# Patient Record
Sex: Male | Born: 1935
Health system: Southern US, Community
[De-identification: ages and names within clinical notes are randomized; demographics above are authoritative.]

## PROBLEM LIST (undated history)

## (undated) DIAGNOSIS — L309 Dermatitis, unspecified: Secondary | ICD-10-CM

## (undated) DIAGNOSIS — N400 Enlarged prostate without lower urinary tract symptoms: Secondary | ICD-10-CM

## (undated) DIAGNOSIS — K567 Ileus, unspecified: Secondary | ICD-10-CM

## (undated) DIAGNOSIS — I82409 Acute embolism and thrombosis of unspecified deep veins of unspecified lower extremity: Secondary | ICD-10-CM

## (undated) DIAGNOSIS — N320 Bladder-neck obstruction: Secondary | ICD-10-CM

## (undated) DIAGNOSIS — K56609 Unspecified intestinal obstruction, unspecified as to partial versus complete obstruction: Secondary | ICD-10-CM

## (undated) DIAGNOSIS — I480 Paroxysmal atrial fibrillation: Secondary | ICD-10-CM

## (undated) DIAGNOSIS — K5792 Diverticulitis of intestine, part unspecified, without perforation or abscess without bleeding: Secondary | ICD-10-CM

## (undated) DIAGNOSIS — M1990 Unspecified osteoarthritis, unspecified site: Secondary | ICD-10-CM

## (undated) DIAGNOSIS — M4800 Spinal stenosis, site unspecified: Secondary | ICD-10-CM

## (undated) HISTORY — PX: HIP FRACTURE SURGERY: SHX118

## (undated) HISTORY — PX: TONSILLECTOMY: SUR1361

## (undated) HISTORY — PX: CIRCUMCISION: SUR203

## (undated) HISTORY — PX: INGUINAL HERNIA REPAIR: SUR1180

## (undated) HISTORY — PX: CATARACT EXTRACTION W/ INTRAOCULAR LENS  IMPLANT, BILATERAL: SHX1307

## (undated) HISTORY — PX: FRACTURE SURGERY: SHX138

## (undated) HISTORY — PX: BACK SURGERY: SHX140

---

## 1998-12-01 ENCOUNTER — Inpatient Hospital Stay (HOSPITAL_COMMUNITY): Admission: EM | Admit: 1998-12-01 | Discharge: 1998-12-05 | Payer: Self-pay | Admitting: Emergency Medicine

## 1998-12-01 ENCOUNTER — Encounter: Payer: Self-pay | Admitting: Emergency Medicine

## 1998-12-03 ENCOUNTER — Encounter: Payer: Self-pay | Admitting: Orthopedic Surgery

## 2006-03-29 ENCOUNTER — Emergency Department (HOSPITAL_COMMUNITY): Admission: EM | Admit: 2006-03-29 | Discharge: 2006-03-29 | Payer: Self-pay | Admitting: Family Medicine

## 2006-04-04 ENCOUNTER — Emergency Department (HOSPITAL_COMMUNITY): Admission: EM | Admit: 2006-04-04 | Discharge: 2006-04-04 | Payer: Self-pay | Admitting: Family Medicine

## 2008-10-01 ENCOUNTER — Ambulatory Visit: Payer: Self-pay | Admitting: Gastroenterology

## 2008-10-01 DIAGNOSIS — K5732 Diverticulitis of large intestine without perforation or abscess without bleeding: Secondary | ICD-10-CM | POA: Insufficient documentation

## 2008-10-01 DIAGNOSIS — K5909 Other constipation: Secondary | ICD-10-CM | POA: Insufficient documentation

## 2008-10-21 ENCOUNTER — Ambulatory Visit: Payer: Self-pay | Admitting: Gastroenterology

## 2008-10-30 ENCOUNTER — Telehealth: Payer: Self-pay | Admitting: Gastroenterology

## 2013-01-28 ENCOUNTER — Other Ambulatory Visit: Payer: Self-pay | Admitting: Internal Medicine

## 2013-01-28 DIAGNOSIS — I8290 Acute embolism and thrombosis of unspecified vein: Secondary | ICD-10-CM

## 2013-01-30 ENCOUNTER — Ambulatory Visit
Admission: RE | Admit: 2013-01-30 | Discharge: 2013-01-30 | Disposition: A | Discharge status: Home / Self Care | Payer: Medicare Other | Source: Ambulatory Visit | Attending: Internal Medicine | Admitting: Internal Medicine

## 2013-01-30 DIAGNOSIS — I8290 Acute embolism and thrombosis of unspecified vein: Secondary | ICD-10-CM

## 2013-04-22 ENCOUNTER — Other Ambulatory Visit: Payer: Self-pay | Admitting: Internal Medicine

## 2013-04-22 ENCOUNTER — Ambulatory Visit
Admission: RE | Admit: 2013-04-22 | Discharge: 2013-04-22 | Disposition: A | Discharge status: Home / Self Care | Payer: Medicare Other | Source: Ambulatory Visit | Attending: Internal Medicine | Admitting: Internal Medicine

## 2013-04-22 DIAGNOSIS — R269 Unspecified abnormalities of gait and mobility: Secondary | ICD-10-CM

## 2015-07-21 ENCOUNTER — Ambulatory Visit: Payer: Self-pay | Admitting: Orthopedic Surgery

## 2015-07-21 NOTE — H&P (Signed)
Derek Bentley is an 79 y.o. male.   Chief Complaint: Right greater than left lower extremity radicular pain. The patient reports in history or subjective he is unable to walk any long distances. He has to use a wheelchair and a walker. HPI: The patient is a 79 year old male who presents with back pain. The patient is here today in referral from Dr. Ramos. The patient reports low back symptoms including low back pain which began year(s) ago without any known injury. The pain radiates to the left buttock and right buttock. The patient describes the severity of their symptoms as severe. Symptoms are exacerbated by standing. Current treatment includes non-opioid analgesics (OTC) and activity modification. Prior to being seen today the patient was previously evaluated by Dr. Ranos. Past evaluation has included x-ray of the lumbar spine and MRI of the lumbar spine. Past treatment has included non-opioid analgesics (Tramadol) and epidural injections (@ L5-S1 left).  REVIEW OF SYSTEMS Review of systems is negative for fevers, chest pain, shortness of breath, unexplained recent weight loss, loss of bowel or bladder function, burning with urination, joint swelling, rashes, weakness or numbness, difficulty with balance, easy bruising, excessive thirst or frequent urination.  I reviewed Dr. Ramos' notes. His epidural was performed. The patient has reported no lasting help from the epidural nor physical therapy.  No past medical history on file.  No past surgical history on file.  No family history on file. Social History:  has no tobacco, alcohol, and drug history on file.  Allergies: Allergies not on file   (Not in a hospital admission)  No results found for this or any previous visit (from the past 48 hour(s)). No results found.  Review of Systems  Constitutional: Negative.   HENT: Negative.   Eyes: Negative.   Respiratory: Negative.   Cardiovascular: Negative.   Gastrointestinal: Negative.    Genitourinary: Negative.   Musculoskeletal: Positive for back pain.  Skin: Negative.   Neurological: Positive for focal weakness.  Psychiatric/Behavioral: Negative.     There were no vitals taken for this visit. Physical Exam  Constitutional: He is oriented to person, place, and time. He appears well-developed and well-nourished.  HENT:  Head: Normocephalic.  Eyes: Pupils are equal, round, and reactive to light.  Neck: Normal range of motion.  Cardiovascular: Normal rate.   Respiratory: Effort normal.  GI: Soft.  Musculoskeletal:  On exam, he is in moderate distress. He is actually in a wheelchair today. Quad atrophy is noted bilaterally. He has a straight leg raise and produces buttock and thigh pain bilaterally. Right greater than left. Osteoarthrosis is noted in his knees bilaterally, but no significant effusion.  He has restricted motion of his hips, but it does not reproduce pain. Peripheral pulses are intact. No evidence of DVT.  Neurological: He is alert and oriented to person, place, and time.    AP lateral flexion and extension radiographs of lumbar spine demonstrates multilevel lumbar spondylosis. No instability on flexion and extension. Neural foraminal narrowing is noted. He has bilateral severe osteoarthrosis of the hips with retained hardware status post ORIF of his left hip.  MRI of lumbar spine obtained by Dr. Ramos demonstrates severe multifactorial spinal stenosis at 4-5 with a small extruded fragment at 4-5 to the right. He has facet arthropathy and neural foraminal stenosis. He has moderate stenosis at 3-4.   Assessment/Plan 1. 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  We had extensive discussion concerning for pathology, relevant anatomy and treatment options. He has failed conservative treatment and he is  rendered to a wheelchair. He is unable to walk. He is here with his wife. One option he may need to consider is decompression at 4-5 and possible 3-4 with resection of the hemilamina L4 in the neural arch to reduce the neurogenic symptoms.  I had an extensive discussion of the risks and benefits of the lumbar decompression with the patient including bleeding, infection, damage to neurovascular structures, epidural fibrosis, CSF leak requiring repair. We also discussed increase in pain, adjacent segment disease, recurrent disc herniation, need for future surgery including repeat decompression and/or fusion. We also discussed risks of postoperative hematoma, paralysis, anesthetic complications including DVT, PE, death, cardiopulmonary dysfunction. In addition, the perioperative and postoperative courses were discussed in detail including the rehabilitative time and return to functional activity and work. I provided the patient with an illustrated handout and utilized the appropriate surgical models. They would like to proceed with it.  In the interim, he is to continue using his walker and wheelchair and preoperative clearance. Other option is just to live with his symptoms, however, he is severely limited in terms of ambulatory capacity. He may require attention to his hips in the future as well as he does have significant osteoarthrosis of his hips as well.  Plan microlumbar decompression L4-5, possible L3-4, microdiscectomy L4-5  BISSELL, JACLYN M. PA-C for Dr. Beane 07/21/2015, 4:54 PM    

## 2015-07-21 NOTE — Progress Notes (Signed)
Please put orders in Epic surgery 07-29-15 pre op 07-23-15 Thanks

## 2015-07-22 NOTE — Patient Instructions (Signed)
Derek Bentley  07/22/2015   Your procedure is scheduled on:    07/29/2015    Report to [facility] Main  Entrance take [place]  elevators to 3rd floor to  Short Stay Center at     0830 AM.  Call this number if you have problems the morning of surgery [phone]   Remember: ONLY 1 PERSON MAY GO WITH YOU TO SHORT STAY TO GET  READY MORNING OF YOUR SURGERY.  Do not eat food or drink liquids :After Midnight.     Take these medicines the morning of surgery with A SIP OF WATER:  DO NOT TAKE ANY DIABETIC MEDICATIONS DAY OF YOUR SURGERY                               You may not have any metal on your body including hair pins and              piercings  Do not wear jewelry, , lotions, powders or perfumes, deodorant                       Men may shave face and neck.   Do not bring valuables to the hospital. [facility] IS NOT             RESPONSIBLE   FOR VALUABLES.  Contacts, dentures or bridgework may not be worn into surgery.  Leave suitcase in the car. After surgery it may be brought to your room.       Special Instructions: coughing and deep breathing exercises, leg exercises               Please read over the following fact sheets you were given: _____________________________________________________________________             [facility] - Preparing for Surgery Before surgery, you can play an important role.  Because skin is not sterile, your skin needs to be as free of germs as possible.  You can reduce the number of germs on your skin by washing with CHG (chlorahexidine gluconate) soap before surgery.  CHG is an antiseptic cleaner which kills germs and bonds with the skin to continue killing germs even after washing. Please DO NOT use if you have an allergy to CHG or antibacterial soaps.  If your skin becomes reddened/irritated stop using the CHG and inform your nurse when you arrive at Short Stay. Do not shave (including legs and underarms) for at least 48  hours prior to the first CHG shower.  You may shave your face/neck. Please follow these instructions carefully:  1.  Shower with CHG Soap the night before surgery and the  morning of Surgery.  2.  If you choose to wash your hair, wash your hair first as usual with your  normal  shampoo.  3.  After you shampoo, rinse your hair and body thoroughly to remove the  shampoo.                           4.  Use CHG as you would any other liquid soap.  You can apply chg directly  to the skin and wash                       Gently with  a scrungie or clean washcloth.  5.  Apply the CHG Soap to your body ONLY FROM THE NECK DOWN.   Do not use on face/ open                           Wound or open sores. Avoid contact with eyes, ears mouth and genitals (private parts).                       Wash face,  Genitals (private parts) with your normal soap.             6.  Wash thoroughly, paying special attention to the area where your surgery  will be performed.  7.  Thoroughly rinse your body with warm water from the neck down.  8.  DO NOT shower/wash with your normal soap after using and rinsing off  the CHG Soap.                9.  Pat yourself dry with a clean towel.            10.  Wear clean pajamas.            11.  Place clean sheets on your bed the night of your first shower and do not  sleep with pets. Day of Surgery : Do not apply any lotions/deodorants the morning of surgery.  Please wear clean clothes to the hospital/surgery center.  FAILURE TO FOLLOW THESE INSTRUCTIONS MAY RESULT IN THE CANCELLATION OF YOUR SURGERY PATIENT SIGNATURE_________________________________  NURSE SIGNATURE__________________________________  ________________________________________________________________________   Derek Bentley  An incentive spirometer is a tool that can help keep your lungs clear and active. This tool measures how well you are filling your lungs with each breath. Taking long deep breaths may help  reverse or decrease the chance of developing breathing (pulmonary) problems (especially infection) following:  A long period of time when you are unable to move or be active. BEFORE THE PROCEDURE   If the spirometer includes an indicator to show your best effort, your nurse or respiratory therapist will set it to a desired goal.  If possible, sit up straight or lean slightly forward. Try not to slouch.  Hold the incentive spirometer in an upright position. INSTRUCTIONS FOR USE  1. Sit on the edge of your bed if possible, or sit up as far as you can in bed or on a chair. 2. Hold the incentive spirometer in an upright position. 3. Breathe out normally. 4. Place the mouthpiece in your mouth and seal your lips tightly around it. 5. Breathe in slowly and as deeply as possible, raising the piston or the ball toward the top of the column. 6. Hold your breath for 3-5 seconds or for as long as possible. Allow the piston or ball to fall to the bottom of the column. 7. Remove the mouthpiece from your mouth and breathe out normally. 8. Rest for a few seconds and repeat Steps 1 through 7 at least 10 times every 1-2 hours when you are awake. Take your time and take a few normal breaths between deep breaths. 9. The spirometer may include an indicator to show your best effort. Use the indicator as a goal to work toward during each repetition. 10. After each set of 10 deep breaths, practice coughing to be sure your lungs are clear. If you have an incision (the cut made at the time of surgery), support your  incision when coughing by placing a pillow or rolled up towels firmly against it. Once you are able to get out of bed, walk around indoors and cough well. You may stop using the incentive spirometer when instructed by your caregiver.  RISKS AND COMPLICATIONS  Take your time so you do not get dizzy or light-headed.  If you are in pain, you may need to take or ask for pain medication before doing incentive  spirometry. It is harder to take a deep breath if you are having pain. AFTER USE  Rest and breathe slowly and easily.  It can be helpful to keep track of a log of your progress. Your caregiver can provide you with a simple table to help with this. If you are using the spirometer at home, follow these instructions: [place] IF:   You are having difficultly using the spirometer.  You have trouble using the spirometer as often as instructed.  Your pain medication is not giving enough relief while using the spirometer.  You develop fever of 100.5 F (38.1 C) or higher. SEEK IMMEDIATE MEDICAL CARE IF:   You cough up bloody sputum that had not been present before.  You develop fever of 102 F (38.9 C) or greater.  You develop worsening pain at or near the incision site. MAKE SURE YOU:   Understand these instructions.  Will watch your condition.  Will get help right away if you are not doing well or get worse. Document Released: 02/27/2007 Document Revised: 01/09/2012 Document Reviewed: 04/30/2007 [facility] Patient Information 2014 [place], [place].   ________________________________________________________________________

## 2015-07-23 ENCOUNTER — Ambulatory Visit (HOSPITAL_COMMUNITY)
Admission: RE | Admit: 2015-07-23 | Discharge: 2015-07-23 | Disposition: A | Discharge status: Home / Self Care | Payer: Medicare Other | Source: Ambulatory Visit | Attending: Orthopedic Surgery | Admitting: Orthopedic Surgery

## 2015-07-23 ENCOUNTER — Encounter (HOSPITAL_COMMUNITY)
Admission: RE | Admit: 2015-07-23 | Discharge: 2015-07-23 | Disposition: A | Discharge status: Home / Self Care | Payer: Medicare Other | Source: Ambulatory Visit | Attending: Specialist | Admitting: Specialist

## 2015-07-23 ENCOUNTER — Encounter (HOSPITAL_COMMUNITY): Payer: Self-pay

## 2015-07-23 ENCOUNTER — Ambulatory Visit: Payer: Self-pay | Admitting: Orthopedic Surgery

## 2015-07-23 DIAGNOSIS — M4854XA Collapsed vertebra, not elsewhere classified, thoracic region, initial encounter for fracture: Secondary | ICD-10-CM | POA: Diagnosis not present

## 2015-07-23 DIAGNOSIS — M48061 Spinal stenosis, lumbar region without neurogenic claudication: Secondary | ICD-10-CM

## 2015-07-23 DIAGNOSIS — M4806 Spinal stenosis, lumbar region: Secondary | ICD-10-CM | POA: Diagnosis not present

## 2015-07-23 DIAGNOSIS — Z01812 Encounter for preprocedural laboratory examination: Secondary | ICD-10-CM | POA: Insufficient documentation

## 2015-07-23 DIAGNOSIS — M4186 Other forms of scoliosis, lumbar region: Secondary | ICD-10-CM | POA: Diagnosis not present

## 2015-07-23 DIAGNOSIS — Z01818 Encounter for other preprocedural examination: Secondary | ICD-10-CM | POA: Insufficient documentation

## 2015-07-23 HISTORY — DX: Unspecified osteoarthritis, unspecified site: M19.90

## 2015-07-23 LAB — SURGICAL PCR SCREEN
MRSA, PCR: NEGATIVE
Staphylococcus aureus: POSITIVE — AB

## 2015-07-23 LAB — BASIC METABOLIC PANEL
ANION GAP: 6 (ref 5–15)
BUN: 10 mg/dL (ref 6–20)
CHLORIDE: 108 mmol/L (ref 101–111)
CO2: 27 mmol/L (ref 22–32)
Calcium: 9.2 mg/dL (ref 8.9–10.3)
Creatinine, Ser: 0.86 mg/dL (ref 0.61–1.24)
GFR calc Af Amer: >60 mL/min (ref >60)
GLUCOSE: 132 mg/dL — AB (ref 65–99)
POTASSIUM: 3.8 mmol/L (ref 3.5–5.1)
Sodium: 141 mmol/L (ref 135–145)

## 2015-07-23 LAB — CBC
HEMATOCRIT: 41.3 % (ref 39.0–52.0)
HEMOGLOBIN: 13.9 g/dL (ref 13.0–17.0)
MCH: 33.3 pg (ref 26.0–34.0)
MCHC: 33.7 g/dL (ref 30.0–36.0)
MCV: 98.8 fL (ref 78.0–100.0)
PLATELETS: 185 10*3/uL (ref 150–400)
RBC: 4.18 MIL/uL — ABNORMAL LOW (ref 4.22–5.81)
RDW: 13.1 % (ref 11.5–15.5)
WBC: 4.5 10*3/uL (ref 4.0–10.5)

## 2015-07-29 ENCOUNTER — Inpatient Hospital Stay (HOSPITAL_COMMUNITY): Payer: Medicare Other

## 2015-07-29 ENCOUNTER — Inpatient Hospital Stay (HOSPITAL_COMMUNITY): Payer: Medicare Other | Admitting: Certified Registered Nurse Anesthetist

## 2015-07-29 ENCOUNTER — Encounter (HOSPITAL_COMMUNITY): Payer: Self-pay | Admitting: *Deleted

## 2015-07-29 ENCOUNTER — Inpatient Hospital Stay (HOSPITAL_COMMUNITY)
Admission: AD | Admit: 2015-07-29 | Discharge: 2015-08-03 | DRG: 519 | Disposition: A | Discharge status: Home Health | Payer: Medicare Other | Source: Ambulatory Visit | Attending: Specialist | Admitting: Specialist

## 2015-07-29 ENCOUNTER — Ambulatory Visit (HOSPITAL_COMMUNITY): Payer: Medicare Other

## 2015-07-29 ENCOUNTER — Encounter (HOSPITAL_COMMUNITY)
Admission: AD | Disposition: A | Discharge status: Home Health | Payer: Self-pay | Source: Ambulatory Visit | Attending: Specialist

## 2015-07-29 DIAGNOSIS — R031 Nonspecific low blood-pressure reading: Secondary | ICD-10-CM | POA: Diagnosis not present

## 2015-07-29 DIAGNOSIS — N401 Enlarged prostate with lower urinary tract symptoms: Secondary | ICD-10-CM | POA: Diagnosis not present

## 2015-07-29 DIAGNOSIS — R9389 Abnormal findings on diagnostic imaging of other specified body structures: Secondary | ICD-10-CM | POA: Diagnosis present

## 2015-07-29 DIAGNOSIS — M160 Bilateral primary osteoarthritis of hip: Secondary | ICD-10-CM | POA: Diagnosis present

## 2015-07-29 DIAGNOSIS — M545 Low back pain: Secondary | ICD-10-CM | POA: Diagnosis not present

## 2015-07-29 DIAGNOSIS — R938 Abnormal findings on diagnostic imaging of other specified body structures: Secondary | ICD-10-CM | POA: Diagnosis not present

## 2015-07-29 DIAGNOSIS — M549 Dorsalgia, unspecified: Secondary | ICD-10-CM

## 2015-07-29 DIAGNOSIS — R918 Other nonspecific abnormal finding of lung field: Secondary | ICD-10-CM | POA: Diagnosis present

## 2015-07-29 DIAGNOSIS — M5126 Other intervertebral disc displacement, lumbar region: Principal | ICD-10-CM | POA: Diagnosis present

## 2015-07-29 DIAGNOSIS — R509 Fever, unspecified: Secondary | ICD-10-CM | POA: Insufficient documentation

## 2015-07-29 DIAGNOSIS — I4891 Unspecified atrial fibrillation: Secondary | ICD-10-CM | POA: Diagnosis not present

## 2015-07-29 DIAGNOSIS — I9789 Other postprocedural complications and disorders of the circulatory system, not elsewhere classified: Secondary | ICD-10-CM | POA: Diagnosis not present

## 2015-07-29 DIAGNOSIS — M48061 Spinal stenosis, lumbar region without neurogenic claudication: Secondary | ICD-10-CM | POA: Diagnosis present

## 2015-07-29 DIAGNOSIS — R338 Other retention of urine: Secondary | ICD-10-CM | POA: Diagnosis present

## 2015-07-29 DIAGNOSIS — Z8249 Family history of ischemic heart disease and other diseases of the circulatory system: Secondary | ICD-10-CM

## 2015-07-29 DIAGNOSIS — R112 Nausea with vomiting, unspecified: Secondary | ICD-10-CM

## 2015-07-29 DIAGNOSIS — M4806 Spinal stenosis, lumbar region: Secondary | ICD-10-CM | POA: Diagnosis present

## 2015-07-29 DIAGNOSIS — K567 Ileus, unspecified: Secondary | ICD-10-CM | POA: Insufficient documentation

## 2015-07-29 DIAGNOSIS — I959 Hypotension, unspecified: Secondary | ICD-10-CM | POA: Diagnosis present

## 2015-07-29 HISTORY — PX: LUMBAR LAMINECTOMY/DECOMPRESSION MICRODISCECTOMY: SHX5026

## 2015-07-29 LAB — HEMATOCRIT: HEMATOCRIT: 34.4 % — AB (ref 39.0–52.0)

## 2015-07-29 SURGERY — LUMBAR LAMINECTOMY/DECOMPRESSION MICRODISCECTOMY 2 LEVELS
Anesthesia: General | Site: Back

## 2015-07-29 MED ORDER — NEOSTIGMINE METHYLSULFATE 10 MG/10ML IV SOLN
INTRAVENOUS | Status: AC
  Filled 2015-07-29: qty 1

## 2015-07-29 MED ORDER — THROMBIN 5000 UNITS EX SOLR
CUTANEOUS | Status: DC | PRN
  Administered 2015-07-29: 10000 [IU] via TOPICAL

## 2015-07-29 MED ORDER — OXYCODONE-ACETAMINOPHEN 5-325 MG PO TABS
1.0000 | ORAL_TABLET | ORAL | Status: DC | PRN
  Administered 2015-07-29: 1 via ORAL
  Administered 2015-07-30: 2 via ORAL
  Filled 2015-07-29: qty 2
  Filled 2015-07-29: qty 1

## 2015-07-29 MED ORDER — PROPOFOL 10 MG/ML IV BOLUS
INTRAVENOUS | Status: AC
  Filled 2015-07-29: qty 20

## 2015-07-29 MED ORDER — ONDANSETRON HCL 4 MG/2ML IJ SOLN
INTRAMUSCULAR | Status: DC | PRN
  Administered 2015-07-29: 4 mg via INTRAVENOUS

## 2015-07-29 MED ORDER — CLINDAMYCIN PHOSPHATE 900 MG/50ML IV SOLN
900.0000 mg | INTRAVENOUS | Status: AC
  Administered 2015-07-29: 900 mg via INTRAVENOUS

## 2015-07-29 MED ORDER — PHENYLEPHRINE HCL 10 MG/ML IJ SOLN
INTRAMUSCULAR | Status: AC
  Filled 2015-07-29: qty 1

## 2015-07-29 MED ORDER — ROCURONIUM BROMIDE 100 MG/10ML IV SOLN
INTRAVENOUS | Status: AC
  Filled 2015-07-29: qty 1

## 2015-07-29 MED ORDER — SODIUM CHLORIDE 0.9 % IV BOLUS (SEPSIS)
500.0000 mL | Freq: Once | INTRAVENOUS | Status: AC
  Administered 2015-07-29: 500 mL via INTRAVENOUS

## 2015-07-29 MED ORDER — ACETAMINOPHEN 650 MG RE SUPP: 650.0000 mg | RECTAL | Status: DC | PRN

## 2015-07-29 MED ORDER — MAGNESIUM CITRATE PO SOLN
1.0000 | Freq: Once | ORAL | Status: AC | PRN
  Administered 2015-08-01: 1 via ORAL

## 2015-07-29 MED ORDER — MIDAZOLAM HCL 5 MG/5ML IJ SOLN
INTRAMUSCULAR | Status: DC | PRN
  Administered 2015-07-29: 1 mg via INTRAVENOUS

## 2015-07-29 MED ORDER — LIDOCAINE HCL (CARDIAC) 20 MG/ML IV SOLN
INTRAVENOUS | Status: DC | PRN
  Administered 2015-07-29: 50 mg via INTRAVENOUS

## 2015-07-29 MED ORDER — HYDROMORPHONE HCL 1 MG/ML IJ SOLN
INTRAMUSCULAR | Status: AC
  Filled 2015-07-29: qty 1

## 2015-07-29 MED ORDER — RISAQUAD PO CAPS
1.0000 | ORAL_CAPSULE | Freq: Every day | ORAL | Status: DC
  Administered 2015-07-30 – 2015-08-03 (×5): 1 via ORAL
  Filled 2015-07-29 (×6): qty 1

## 2015-07-29 MED ORDER — KCL IN DEXTROSE-NACL 20-5-0.45 MEQ/L-%-% IV SOLN
INTRAVENOUS | Status: DC
  Administered 2015-07-29: 50 mL/h via INTRAVENOUS
  Filled 2015-07-29 (×2): qty 1000

## 2015-07-29 MED ORDER — DOCUSATE SODIUM 100 MG PO CAPS: 100.0000 mg | ORAL_CAPSULE | Freq: Two times a day (BID) | ORAL | Status: DC | PRN

## 2015-07-29 MED ORDER — ROCURONIUM BROMIDE 100 MG/10ML IV SOLN
INTRAVENOUS | Status: DC | PRN
  Administered 2015-07-29: 10 mg via INTRAVENOUS
  Administered 2015-07-29: 20 mg via INTRAVENOUS

## 2015-07-29 MED ORDER — THROMBIN 5000 UNITS EX SOLR
CUTANEOUS | Status: AC
  Filled 2015-07-29: qty 10000

## 2015-07-29 MED ORDER — FOLIC ACID 1 MG PO TABS
1.0000 mg | ORAL_TABLET | Freq: Every day | ORAL | Status: AC
  Administered 2015-07-30 – 2015-08-01 (×3): 1 mg via ORAL
  Filled 2015-07-29 (×3): qty 1

## 2015-07-29 MED ORDER — HYDROMORPHONE HCL 1 MG/ML IJ SOLN
0.2500 mg | INTRAMUSCULAR | Status: DC | PRN
  Administered 2015-07-29 (×2): 0.5 mg via INTRAVENOUS

## 2015-07-29 MED ORDER — GLYCOPYRROLATE 0.2 MG/ML IJ SOLN
INTRAMUSCULAR | Status: DC | PRN
  Administered 2015-07-29: 0.6 mg via INTRAVENOUS

## 2015-07-29 MED ORDER — SUCCINYLCHOLINE CHLORIDE 20 MG/ML IJ SOLN
INTRAMUSCULAR | Status: DC | PRN
  Administered 2015-07-29: 100 mg via INTRAVENOUS

## 2015-07-29 MED ORDER — ONDANSETRON HCL 4 MG/2ML IJ SOLN: 4.0000 mg | INTRAMUSCULAR | Status: DC | PRN

## 2015-07-29 MED ORDER — SODIUM CHLORIDE 0.9 % IJ SOLN
INTRAMUSCULAR | Status: AC
  Filled 2015-07-29: qty 20

## 2015-07-29 MED ORDER — SODIUM CHLORIDE 0.9 % IR SOLN
Status: AC
  Filled 2015-07-29: qty 1

## 2015-07-29 MED ORDER — CLINDAMYCIN PHOSPHATE 900 MG/50ML IV SOLN
INTRAVENOUS | Status: AC
  Filled 2015-07-29: qty 50

## 2015-07-29 MED ORDER — FENTANYL CITRATE (PF) 250 MCG/5ML IJ SOLN
INTRAMUSCULAR | Status: AC
  Filled 2015-07-29: qty 25

## 2015-07-29 MED ORDER — PHENOL 1.4 % MT LIQD: 1.0000 | OROMUCOSAL | Status: DC | PRN

## 2015-07-29 MED ORDER — PROPOFOL 10 MG/ML IV BOLUS
INTRAVENOUS | Status: DC | PRN
  Administered 2015-07-29: 150 mg via INTRAVENOUS

## 2015-07-29 MED ORDER — ACETAMINOPHEN 325 MG PO TABS
650.0000 mg | ORAL_TABLET | ORAL | Status: DC | PRN
  Administered 2015-07-30 – 2015-08-02 (×3): 650 mg via ORAL
  Filled 2015-07-29 (×3): qty 2

## 2015-07-29 MED ORDER — CEFAZOLIN SODIUM-DEXTROSE 2-3 GM-% IV SOLR
2.0000 g | INTRAVENOUS | Status: AC
  Administered 2015-07-29: 2 g via INTRAVENOUS

## 2015-07-29 MED ORDER — PROMETHAZINE HCL 25 MG/ML IJ SOLN
6.2500 mg | INTRAMUSCULAR | Status: DC | PRN
Start: 2015-07-29 — End: 2015-07-29

## 2015-07-29 MED ORDER — ONDANSETRON HCL 4 MG/2ML IJ SOLN
INTRAMUSCULAR | Status: AC
  Filled 2015-07-29: qty 2

## 2015-07-29 MED ORDER — HYDROCODONE-ACETAMINOPHEN 5-325 MG PO TABS
1.0000 | ORAL_TABLET | ORAL | Status: DC | PRN
  Administered 2015-07-29 – 2015-07-30 (×4): 2 via ORAL
  Filled 2015-07-29 (×4): qty 2

## 2015-07-29 MED ORDER — BUPIVACAINE-EPINEPHRINE (PF) 0.5% -1:200000 IJ SOLN
INTRAMUSCULAR | Status: DC | PRN
  Administered 2015-07-29: 18 mL

## 2015-07-29 MED ORDER — PHENYLEPHRINE HCL 10 MG/ML IJ SOLN
INTRAMUSCULAR | Status: DC | PRN
  Administered 2015-07-29: 80 ug via INTRAVENOUS

## 2015-07-29 MED ORDER — NEOSTIGMINE METHYLSULFATE 10 MG/10ML IV SOLN
INTRAVENOUS | Status: DC | PRN
  Administered 2015-07-29: 4 mg via INTRAVENOUS

## 2015-07-29 MED ORDER — BUPIVACAINE-EPINEPHRINE (PF) 0.5% -1:200000 IJ SOLN
INTRAMUSCULAR | Status: AC
  Filled 2015-07-29: qty 30

## 2015-07-29 MED ORDER — MENTHOL 3 MG MT LOZG
1.0000 | LOZENGE | OROMUCOSAL | Status: DC | PRN
  Administered 2015-07-30: 3 mg via ORAL
  Filled 2015-07-29: qty 9

## 2015-07-29 MED ORDER — PHENYLEPHRINE 40 MCG/ML (10ML) SYRINGE FOR IV PUSH (FOR BLOOD PRESSURE SUPPORT)
PREFILLED_SYRINGE | INTRAVENOUS | Status: AC
Start: 2015-07-29 — End: 2015-07-29
  Filled 2015-07-29: qty 20

## 2015-07-29 MED ORDER — DEXTROSE 5 % IV SOLN
500.0000 mg | Freq: Four times a day (QID) | INTRAVENOUS | Status: DC | PRN
  Administered 2015-07-29 (×2): 500 mg via INTRAVENOUS
  Filled 2015-07-29 (×4): qty 5

## 2015-07-29 MED ORDER — MIDAZOLAM HCL 2 MG/2ML IJ SOLN
INTRAMUSCULAR | Status: AC
  Filled 2015-07-29: qty 4

## 2015-07-29 MED ORDER — HYDROCODONE-ACETAMINOPHEN 5-325 MG PO TABS: 1.0000 | ORAL_TABLET | ORAL | Status: DC | PRN

## 2015-07-29 MED ORDER — BISACODYL 5 MG PO TBEC
5.0000 mg | DELAYED_RELEASE_TABLET | Freq: Every day | ORAL | Status: DC | PRN
  Administered 2015-08-01: 5 mg via ORAL
  Filled 2015-07-29: qty 1

## 2015-07-29 MED ORDER — LACTATED RINGERS IV SOLN
INTRAVENOUS | Status: DC
  Administered 2015-07-29: 1000 mL via INTRAVENOUS
  Administered 2015-07-29: 11:00:00 via INTRAVENOUS

## 2015-07-29 MED ORDER — SENNOSIDES-DOCUSATE SODIUM 8.6-50 MG PO TABS: 1.0000 | ORAL_TABLET | Freq: Every evening | ORAL | Status: DC | PRN

## 2015-07-29 MED ORDER — VITAMIN C 500 MG PO TABS
500.0000 mg | ORAL_TABLET | Freq: Every day | ORAL | Status: DC
  Administered 2015-07-30 – 2015-08-03 (×5): 500 mg via ORAL
  Filled 2015-07-29 (×5): qty 1

## 2015-07-29 MED ORDER — FENTANYL CITRATE (PF) 100 MCG/2ML IJ SOLN
INTRAMUSCULAR | Status: DC | PRN
  Administered 2015-07-29: 50 ug via INTRAVENOUS

## 2015-07-29 MED ORDER — CEFAZOLIN SODIUM-DEXTROSE 2-3 GM-% IV SOLR
2.0000 g | Freq: Three times a day (TID) | INTRAVENOUS | Status: AC
  Administered 2015-07-29 – 2015-07-30 (×3): 2 g via INTRAVENOUS
  Filled 2015-07-29 (×3): qty 50

## 2015-07-29 MED ORDER — LIDOCAINE HCL (CARDIAC) 20 MG/ML IV SOLN
INTRAVENOUS | Status: AC
  Filled 2015-07-29: qty 5

## 2015-07-29 MED ORDER — GLYCOPYRROLATE 0.2 MG/ML IJ SOLN
INTRAMUSCULAR | Status: AC
  Filled 2015-07-29: qty 3

## 2015-07-29 MED ORDER — SODIUM CHLORIDE 0.9 % IR SOLN
Status: DC | PRN
  Administered 2015-07-29: 500 mL

## 2015-07-29 MED ORDER — CEFAZOLIN SODIUM-DEXTROSE 2-3 GM-% IV SOLR
INTRAVENOUS | Status: AC
  Filled 2015-07-29: qty 50

## 2015-07-29 MED ORDER — VITAMIN B-12 100 MCG PO TABS
ORAL_TABLET | Freq: Every day | ORAL | Status: DC
  Filled 2015-07-29 (×2): qty 1

## 2015-07-29 MED ORDER — HYDROMORPHONE HCL 1 MG/ML IJ SOLN
0.5000 mg | INTRAMUSCULAR | Status: DC | PRN
  Administered 2015-07-29: 0.5 mg via INTRAVENOUS
  Filled 2015-07-29: qty 1

## 2015-07-29 MED ORDER — METHOCARBAMOL 500 MG PO TABS
500.0000 mg | ORAL_TABLET | Freq: Four times a day (QID) | ORAL | Status: DC | PRN
  Filled 2015-07-29: qty 1

## 2015-07-29 MED ORDER — PHENYLEPHRINE HCL 10 MG/ML IJ SOLN
10.0000 mg | INTRAMUSCULAR | Status: DC | PRN
  Administered 2015-07-29: 25 ug/min via INTRAVENOUS

## 2015-07-29 MED ORDER — DOCUSATE SODIUM 100 MG PO CAPS
100.0000 mg | ORAL_CAPSULE | Freq: Two times a day (BID) | ORAL | Status: DC
  Administered 2015-07-29 – 2015-08-03 (×6): 100 mg via ORAL
  Filled 2015-07-29 (×4): qty 1

## 2015-07-29 MED ORDER — ALUM & MAG HYDROXIDE-SIMETH 200-200-20 MG/5ML PO SUSP: 30.0000 mL | Freq: Four times a day (QID) | ORAL | Status: DC | PRN

## 2015-07-29 SURGICAL SUPPLY — 48 items
BAG ZIPLOCK 12X15 (MISCELLANEOUS) IMPLANT
CLEANER TIP ELECTROSURG 2X2 (MISCELLANEOUS) ×2 IMPLANT
CLOTH 2% CHLOROHEXIDINE 3PK (PERSONAL CARE ITEMS) ×2 IMPLANT
DRAPE MICROSCOPE LEICA (MISCELLANEOUS) ×2 IMPLANT
DRAPE SHEET LG 3/4 BI-LAMINATE (DRAPES) ×2 IMPLANT
DRAPE SURG 17X11 SM STRL (DRAPES) ×2 IMPLANT
DRAPE UTILITY XL STRL (DRAPES) ×2 IMPLANT
DRSG AQUACEL AG ADV 3.5X 4 (GAUZE/BANDAGES/DRESSINGS) IMPLANT
DRSG AQUACEL AG ADV 3.5X 6 (GAUZE/BANDAGES/DRESSINGS) ×2 IMPLANT
DRSG TELFA 3X8 NADH (GAUZE/BANDAGES/DRESSINGS) ×2 IMPLANT
DURAPREP 26ML APPLICATOR (WOUND CARE) ×2 IMPLANT
DURASEAL SPINE SEALANT 3ML (MISCELLANEOUS) IMPLANT
ELECT BLADE TIP CTD 4 INCH (ELECTRODE) ×2 IMPLANT
ELECT REM PT RETURN 9FT ADLT (ELECTROSURGICAL) ×2
ELECTRODE REM PT RTRN 9FT ADLT (ELECTROSURGICAL) ×1 IMPLANT
GLOVE BIOGEL PI IND STRL 7.0 (GLOVE) ×1 IMPLANT
GLOVE BIOGEL PI INDICATOR 7.0 (GLOVE) ×1
GLOVE SURG SS PI 7.0 STRL IVOR (GLOVE) ×2 IMPLANT
GLOVE SURG SS PI 7.5 STRL IVOR (GLOVE) IMPLANT
GLOVE SURG SS PI 8.0 STRL IVOR (GLOVE) ×4 IMPLANT
GOWN STRL REUS W/TWL XL LVL3 (GOWN DISPOSABLE) ×4 IMPLANT
IV CATH 14GX2 1/4 (CATHETERS) ×2 IMPLANT
KIT BASIN OR (CUSTOM PROCEDURE TRAY) ×2 IMPLANT
KIT POSITIONING SURG ANDREWS (MISCELLANEOUS) ×2 IMPLANT
MANIFOLD NEPTUNE II (INSTRUMENTS) ×2 IMPLANT
NEEDLE SPNL 18GX3.5 QUINCKE PK (NEEDLE) ×4 IMPLANT
PACK LAMINECTOMY ORTHO (CUSTOM PROCEDURE TRAY) ×2 IMPLANT
PATTIES SURGICAL .5 X.5 (GAUZE/BANDAGES/DRESSINGS) ×2 IMPLANT
PATTIES SURGICAL .75X.75 (GAUZE/BANDAGES/DRESSINGS) ×2 IMPLANT
PATTIES SURGICAL 1X1 (DISPOSABLE) ×2 IMPLANT
PEN SKIN MARKING BROAD (MISCELLANEOUS) ×2 IMPLANT
RUBBERBAND STERILE (MISCELLANEOUS) ×2 IMPLANT
SPONGE LAP 4X18 X RAY DECT (DISPOSABLE) ×2 IMPLANT
SPONGE SURGIFOAM ABS GEL 100 (HEMOSTASIS) ×2 IMPLANT
STAPLER VISISTAT (STAPLE) IMPLANT
STRIP CLOSURE SKIN 1/2X4 (GAUZE/BANDAGES/DRESSINGS) IMPLANT
SUT NURALON 4 0 TR CR/8 (SUTURE) IMPLANT
SUT PROLENE 3 0 PS 2 (SUTURE) IMPLANT
SUT VIC AB 1 CT1 27 (SUTURE) ×1
SUT VIC AB 1 CT1 27XBRD ANTBC (SUTURE) ×1 IMPLANT
SUT VIC AB 1-0 CT2 27 (SUTURE) ×2 IMPLANT
SUT VIC AB 2-0 CT1 27 (SUTURE)
SUT VIC AB 2-0 CT1 TAPERPNT 27 (SUTURE) IMPLANT
SUT VIC AB 2-0 CT2 27 (SUTURE) ×2 IMPLANT
SYR 3ML LL SCALE MARK (SYRINGE) ×2 IMPLANT
TOWEL OR 17X26 10 PK STRL BLUE (TOWEL DISPOSABLE) ×2 IMPLANT
TOWEL OR NON WOVEN STRL DISP B (DISPOSABLE) ×2 IMPLANT
YANKAUER SUCT BULB TIP NO VENT (SUCTIONS) ×2 IMPLANT

## 2015-07-29 NOTE — Interval H&P Note (Signed)
History and Physical Interval Note:  07/29/2015 7:25 AM  Derek Bentley  has presented today for surgery, with the diagnosis of SPINAL STENOSIS   The various methods of treatment have been discussed with the patient and family. After consideration of risks, benefits and other options for treatment, the patient has consented to  Procedure(s): DECOMPRESSION L4-L5 POSSIBLE L3-L4,  MICRODISCECTOMY L4-L5     (2 LEVELS) (N/A) as a surgical intervention .  The patient's history has been reviewed, patient examined, no change in status, stable for surgery.  I have reviewed the patient's chart and labs.  Questions were answered to the patient's satisfaction.     BEANE,JEFFREY C

## 2015-07-29 NOTE — Consult Note (Signed)
PCP:  Thayer Headings, MD    requesting consult provider [facility]   orthopedics    Reason for consult: Hypotension transient   Assessment/recomendations  79 yo with no significant prior medical hx had a transient hypotension event postoperatively improved with gentle IVF bolus.  Currently hemodynamically stable.    Present on Admission:  . Transient hypotension -currently resolved, patient   at baseline has low normal BP. Given mild interstitial edema on CXR would avoid over aggressive fluid resuscitation as long as patient is hemodynamically stable and asymptomatic. Transient hypotension could have been due to fluid shifts post operatively and poor PO intake as well as narcotic administration would hold of on IV narcotics if can tolerate PO. At this point no evidence of sepsis. Or hemorrhagic shock. Patient is back to baseline. Will continue to monitor closely with frequent vital signs.  . Abnormal chest x-ray - mild interstitial edema would avoid fluid overload. Will check BNP and CBC. Infection less likely given patient is afebrile and asymptomatic. If clinical picture changes repeat imaging would be warranted.   HPI: Derek Bentley is a 79 y.o. male   has a past medical history of Arthritis.   Presented with  Patient with history of spinal stenosis height undergone today decompression at L4-L5 by Dr. Jillyn Hidden. Patient apparently was stable in the PACU. Patient was transferred to floor. He was noted his blood pressure has gradually declined from 132/70 at 1 PM down to 89/45 at :50. Patient was a symptomatic. Per review of records patient blood pressure usually runs in the low 100s over 50s. Primary care team ordered 500 IV bolus which was given repeat blood pressure was 107/60. Patient to remain a symptomatic no evidence of fever no associated tachycardia in fact patient have had some mild bradycardia down to 56. His IV pain medications have been held. Patient has been endorsing no  lower extremity pain which has chronic and ongoing. Lactic acid, hematomatocrit and basic metabolic panel was ordered. Given workup for possible sepsis chest x-ray was done showed diffuse increased pulmonary interstitium bilaterally nonspecific was worrisome form interstitial edema versus pneumonitis. I personally reviewed the film as well as discussed it with Radiology although there is some evidence of pulmonary interstitial edema it is exaggerated by film technique.  Patient denies  Any shortness of breath. Does not appears to be fluid overloaded on CXR.     Review of Systems:    Pertinent positives include: Leg pain bilateral  Constitutional:  No weight loss, night sweats, Fevers, chills, fatigue, weight loss  HEENT:  No headaches, Difficulty swallowing,Tooth/dental problems,Sore throat,  No sneezing, itching, ear ache, nasal congestion, post nasal drip,  Cardio-vascular:  No chest pain, Orthopnea, PND, anasarca, dizziness, palpitations.no Bilateral lower extremity swelling  GI:  No heartburn, indigestion, abdominal pain, nausea, vomiting, diarrhea, change in bowel habits, loss of appetite, melena, blood in stool, hematemesis Resp:  no shortness of breath at rest. No dyspnea on exertion, No excess mucus, no productive cough, No non-productive cough, No coughing up of blood.No change in color of mucus.No wheezing. Skin:  no rash or lesions. No jaundice GU:  no dysuria, change in color of urine, no urgency or frequency. No straining to urinate.  No flank pain.  Musculoskeletal:  No joint pain or no joint swelling. No decreased range of motion. No back pain.  Psych:  No change in mood or affect. No depression or anxiety. No memory loss.  Neuro: no localizing neurological complaints, no tingling, no weakness, no  double vision, no gait abnormality, no slurred speech, no confusion  Otherwise ROS are negative except for above, 10 systems were reviewed  Past Medical History: Past  Medical History  Diagnosis Date  . Arthritis    Past Surgical History  Procedure Laterality Date  . Hernia repair      left and right   . Left hip fracture surgery     . Tonsillectomy    . Circumcision    . Lumbar laminectomy/decompression microdiscectomy N/A 07/29/2015    Procedure: DECOMPRESSION L4-L5 and L3-L4,  MICRODISCECTOMY L4-L5     (2 LEVELS);  Surgeon: Jene Every, MD;  Location: [facility];  Service: Orthopedics;  Laterality: N/A;     Medications: Prior to Admission medications   Medication Sig Start Date End Date Taking? Authorizing Provider  acetaminophen (TYLENOL) 500 MG tablet Take 500 mg by mouth every 6 (six) hours as needed for mild pain or headache.   Yes Historical Provider, MD  ascorbic acid (VITAMIN C) 500 MG tablet Take 500 mg by mouth daily.   Yes Historical Provider, MD  calcium-vitamin D (OSCAL WITH D) 500-200 MG-UNIT per tablet Take 1 tablet by mouth daily with breakfast.   Yes Historical Provider, MD  Cyanocobalamin (VITAMIN B 12 PO) Take 1 tablet by mouth daily.   Yes Historical Provider, MD  FOLIC ACID PO Take 1 tablet by mouth daily.   Yes Historical Provider, MD  MAGNESIUM PO Take 1 tablet by mouth daily.   Yes Historical Provider, MD  Multiple Vitamin (MULTIVITAMIN WITH MINERALS) TABS tablet Take 1 tablet by mouth daily.   Yes Historical Provider, MD  TURMERIC PO Take 1 tablet by mouth daily.   Yes Historical Provider, MD  docusate sodium (COLACE) 100 MG capsule Take 1 capsule (100 mg total) by mouth 2 (two) times daily as needed for mild constipation. 07/29/15   Jene Every, MD  HYDROcodone-acetaminophen (NORCO/VICODIN) 5-325 MG tablet Take 1 tablet by mouth every 4 (four) hours as needed. 07/29/15   Jene Every, MD    Allergies:  No Known Allergies  Social History:  Ambulatory  Lives at home  With family     reports that he has never smoked. He does not have any smokeless tobacco history on file. He reports that he does not drink alcohol or  use illicit drugs.    Family History: family history includes Hypertension in his other.    Physical Exam: Patient Vitals for the past 24 hrs:  BP Temp Temp src Pulse Resp SpO2 Height Weight  07/29/15 2308 107/60 mmHg - - - - - - -  07/29/15 2149 (!) 89/45 mmHg 98.2 F (36.8 C) Oral 64 16 100 % - -  07/29/15 1615 (!) 100/51 mmHg 98 F (36.7 C) - 70 18 100 % - -  07/29/15 1515 (!) 105/56 mmHg 97.9 F (36.6 C) - (!) 58 18 100 % - -  07/29/15 1415 (!) 111/41 mmHg 98 F (36.7 C) - 60 18 100 % - -  07/29/15 1314 130/63 mmHg 97.9 F (36.6 C) Oral 68 16 100 %  (1.727 m) 58.514 kg (129 lb)  07/29/15 1300 132/70 mmHg 97.7 F (36.5 C) - (!) 56 16 94 % - -  07/29/15 1245 123/66 mmHg - - 66 14 100 % - -  07/29/15 1240 - - - 68 15 100 % - -  07/29/15 1230 126/61 mmHg - - 71 15 100 % - -  07/29/15 1225 - - - 74 17 100 % - -  07/29/15 1215 122/73 mmHg - - 76 15 100 % - -  07/29/15 1208 130/78 mmHg 97.4 F (36.3 C) - 77 (!) 9 100 % - -  07/29/15 0913 - - - - - - - 58.832 kg (129 lb 11.2 oz)  07/29/15 0846 - - - - - -  (1.727 m) -  07/29/15 0813 (!) 117/46 mmHg 97.7 F (36.5 C) Oral 83 18 100 % - -    1. General:  in No Acute distress 2. Psychological: Alert and  Oriented 3. Head/ENT:     Dry Mucous Membranes                          Head Non traumatic, neck supple                          Normal  Dentition 4. SKIN: normal  Skin turgor,  Skin clean Dry and intact no rash 5. Heart: Regular rate and rhythm no Murmur, Rub or gallop 6. Lungs: Clear to auscultation bilaterally, no wheezes or crackles   7. Abdomen: Soft, non-tender, Non distended 8. Lower extremities: no clubbing, cyanosis, or edema 9. Neurologically Grossly intact, moving all 4 extremities equally 10. MSK: Normal range of motion  body mass index is 19.62 kg/(m^2).   Labs on Admission:   No results found for this or any previous visit (from the past 24 hour(s)).  UA not obtained  No results found for:  HGBA1C  Estimated Creatinine Clearance: 60.5 mL/min (by C-G formula based on Cr of 0.86).  BNP (last 3 results) No results for input(s): PROBNP in the last 8760 hours.  Other results:  I have pearsonaly reviewed this: ECG REPORT Not obtained  [facility] Weights   07/29/15 0913 07/29/15 1314  Weight: 58.832 kg (129 lb 11.2 oz) 58.514 kg (129 lb)     Cultures: No results found for: SDES, SPECREQUEST, CULT, REPTSTATUS   Radiological Exams on Admission: Dg Chest Port 1 View  07/29/2015   CLINICAL DATA:  Abnormally low blood pressure  EXAM: PORTABLE CHEST 1 VIEW  COMPARISON:  July 15, 2015  FINDINGS: The heart size and mediastinal contours are stable. There is bilateral increased pulmonary interstitium. There is no focal pneumonia or pleural effusion. The visualized skeletal structures are stable.  IMPRESSION: Diffuse increased pulmonary interstitium bilaterally. This is nonspecific but can be seen in interstitial edema or viral pneumonitis.   Electronically Signed   By: Sherian Rein M.D.   On: 07/29/2015 23:07   Dg Spine Portable 1 View  07/29/2015   CLINICAL DATA:  Patient for L3-5 lumbar surgery. Intraoperative localization film.  EXAM: PORTABLE SPINE - 1 VIEW  COMPARISON:  Intraoperative localization films earlier today.  FINDINGS: Clamp remains on the L4 spinous process. 3 probes are in place. The most superior projects over the L3-4 foramina. Second more inferior probe projects over the L4-5 foramina. A third probe is at the level of the superior endplate of L5.  IMPRESSION: Localization as above.   Electronically Signed   By: Drusilla Kanner M.D.   On: 07/29/2015 11:40   Dg Spine Portable 1 View  07/29/2015   CLINICAL DATA:  Patient for lumbar surgery L3-5. Intraoperative localization film.  EXAM: PORTABLE SPINE - 1 VIEW  COMPARISON:  Intraoperative localization film earlier today.  FINDINGS: Clamp is now in place along the L4 spinous process. Probe superior to the clamp is directed  toward  the L3-4 interspace and probe inferior to the spinous process is directed toward the L4-5 interspace.  IMPRESSION: Localization as above.  Clamp is on the L4 spinous process.   Electronically Signed   By: Drusilla Kanner M.D.   On: 07/29/2015 10:53   Dg Spine Portable 1 View  07/29/2015   CLINICAL DATA:  Surgical level L3-4, L4-5.  EXAM: PORTABLE SPINE - 1 VIEW  COMPARISON:  07/23/2015  FINDINGS: Posterior surgical instruments are directed at the L3 and L5 spinous processes  IMPRESSION: Intraoperative localization as above.   Electronically Signed   By: Charlett Nose M.D.   On: 07/29/2015 10:36    Chart has been reviewed  Family  at  Bedside  plan of care was discussed with   Wife      Other plan as per orders.  I have spent a total of 65 min on this admission extra time was taken to discuss Xray with radiology  DOUTOVA,ANASTASSIA 07/29/2015, 11:54 PM  [facility]  Pager [phone]   after 2 AM please page floor coverage PA If 7AM-7PM, please contact the day team taking care of the patient  [email or web address]  Password [email or web address]

## 2015-07-29 NOTE — H&P (View-Only) (Signed)
Derek Bentley is an 79 y.o. male.   Chief Complaint: Right greater than left lower extremity radicular pain. The patient reports in history or subjective he is unable to walk any long distances. He has to use a wheelchair and a walker. HPI: The patient is a 79 year old male who presents with back pain. The patient is here today in referral from Dr. Ethelene Hal. The patient reports low back symptoms including low back pain which began year(s) ago without any known injury. The pain radiates to the left buttock and right buttock. The patient describes the severity of their symptoms as severe. Symptoms are exacerbated by standing. Current treatment includes non-opioid analgesics (OTC) and activity modification. Prior to being seen today the patient was previously evaluated by Dr. Gwenith Spitz. Past evaluation has included x-ray of the lumbar spine and MRI of the lumbar spine. Past treatment has included non-opioid analgesics (Tramadol) and epidural injections (@ L5-S1 left).  REVIEW OF SYSTEMS Review of systems is negative for fevers, chest pain, shortness of breath, unexplained recent weight loss, loss of bowel or bladder function, burning with urination, joint swelling, rashes, weakness or numbness, difficulty with balance, easy bruising, excessive thirst or frequent urination.  I reviewed Dr. Ethelene Hal' notes. His epidural was performed. The patient has reported no lasting help from the epidural nor physical therapy.  No past medical history on file.  No past surgical history on file.  No family history on file. Social History:  has no tobacco, alcohol, and drug history on file.  Allergies: Allergies not on file   (Not in a hospital admission)  No results found for this or any previous visit (from the past 48 hour(s)). No results found.  Review of Systems  Constitutional: Negative.   HENT: Negative.   Eyes: Negative.   Respiratory: Negative.   Cardiovascular: Negative.   Gastrointestinal: Negative.    Genitourinary: Negative.   Musculoskeletal: Positive for back pain.  Skin: Negative.   Neurological: Positive for focal weakness.  Psychiatric/Behavioral: Negative.     There were no vitals taken for this visit. Physical Exam  Constitutional: He is oriented to person, place, and time. He appears well-developed and well-nourished.  HENT:  Head: Normocephalic.  Eyes: Pupils are equal, round, and reactive to light.  Neck: Normal range of motion.  Cardiovascular: Normal rate.   Respiratory: Effort normal.  GI: Soft.  Musculoskeletal:  On exam, he is in moderate distress. He is actually in a wheelchair today. Quad atrophy is noted bilaterally. He has a straight leg raise and produces buttock and thigh pain bilaterally. Right greater than left. Osteoarthrosis is noted in his knees bilaterally, but no significant effusion.  He has restricted motion of his hips, but it does not reproduce pain. Peripheral pulses are intact. No evidence of DVT.  Neurological: He is alert and oriented to person, place, and time.    AP lateral flexion and extension radiographs of lumbar spine demonstrates multilevel lumbar spondylosis. No instability on flexion and extension. Neural foraminal narrowing is noted. He has bilateral severe osteoarthrosis of the hips with retained hardware status post ORIF of his left hip.  MRI of lumbar spine obtained by Dr. Ethelene Hal demonstrates severe multifactorial spinal stenosis at 4-5 with a small extruded fragment at 4-5 to the right. He has facet arthropathy and neural foraminal stenosis. He has moderate stenosis at 3-4.   Assessment/Plan 1. Neurogenic claudication secondary to severe spinal stenosis at 4-5, moderate at 3-4, rendering the patient to a wheelchair, refractory to rest,  activity modification, epidural and physical therapy.  We had extensive discussion concerning for pathology, relevant anatomy and treatment options. He has failed conservative treatment and he is  rendered to a wheelchair. He is unable to walk. He is here with his wife. One option he may need to consider is decompression at 4-5 and possible 3-4 with resection of the hemilamina L4 in the neural arch to reduce the neurogenic symptoms.  I had an extensive discussion of the risks and benefits of the lumbar decompression with the patient including bleeding, infection, damage to neurovascular structures, epidural fibrosis, CSF leak requiring repair. We also discussed increase in pain, adjacent segment disease, recurrent disc herniation, need for future surgery including repeat decompression and/or fusion. We also discussed risks of postoperative hematoma, paralysis, anesthetic complications including DVT, PE, death, cardiopulmonary dysfunction. In addition, the perioperative and postoperative courses were discussed in detail including the rehabilitative time and return to functional activity and work. I provided the patient with an illustrated handout and utilized the appropriate surgical models. They would like to proceed with it.  In the interim, he is to continue using his walker and wheelchair and preoperative clearance. Other option is just to live with his symptoms, however, he is severely limited in terms of ambulatory capacity. He may require attention to his hips in the future as well as he does have significant osteoarthrosis of his hips as well.  Plan microlumbar decompression L4-5, possible L3-4, microdiscectomy L4-5  BISSELL, JACLYN M. PA-C for Dr. Shelle Iron 07/21/2015, 4:54 PM

## 2015-07-29 NOTE — Transfer of Care (Signed)
Immediate Anesthesia Transfer of Care Note  Patient: Derek Bentley  Procedure(s) Performed: Procedure(s): DECOMPRESSION L4-L5 and L3-L4,  MICRODISCECTOMY L4-L5     (2 LEVELS) (N/A)  Patient Location: PACU  Anesthesia Type:General  Level of Consciousness: awake, alert  and oriented  Airway & Oxygen Therapy: Patient Spontanous Breathing and Patient connected to face mask oxygen  Post-op Assessment: Report given to RN and Post -op Vital signs reviewed and stable  Post vital signs: Reviewed and stable  Last Vitals:  Filed Vitals:   07/29/15 0813  BP: 117/46  Pulse: 83  Temp: 36.5 C  Resp: 18    Complications: No apparent anesthesia complications

## 2015-07-29 NOTE — Anesthesia Postprocedure Evaluation (Signed)
  Anesthesia Post-op Note  Patient: Derek Bentley  Procedure(s) Performed: Procedure(s): DECOMPRESSION L4-L5 and L3-L4,  MICRODISCECTOMY L4-L5     (2 LEVELS) (N/A)  Patient Location: PACU  Anesthesia Type:General  Level of Consciousness: awake, alert  and sedated  Airway and Oxygen Therapy: Patient Spontanous Breathing  Post-op Pain: mild  Post-op Assessment: Post-op Vital signs reviewed   LLE Sensation: No numbness, No tingling   RLE Sensation: No numbness, No tingling      Post-op Vital Signs: stable  Last Vitals:  Filed Vitals:   07/29/15 1314  BP: 130/63  Pulse: 68  Temp: 36.6 C  Resp: 16    Complications: No apparent anesthesia complications

## 2015-07-29 NOTE — Anesthesia Procedure Notes (Signed)
Procedure Name: Intubation Date/Time: 07/29/2015 10:03 AM Performed by: Carolyne Fiscal, AMY F Pre-anesthesia Checklist: Patient identified, Emergency Drugs available, Suction available, Patient being monitored and Timeout performed Patient Re-evaluated:Patient Re-evaluated prior to inductionOxygen Delivery Method: Circle system utilized Preoxygenation: Pre-oxygenation with 100% oxygen Intubation Type: IV induction Ventilation: Mask ventilation without difficulty Laryngoscope Size: Miller and 2 Grade View: Grade I Tube type: Oral Tube size: 7.5 mm Number of attempts: 1 Airway Equipment and Method: Stylet Placement Confirmation: ETT inserted through vocal cords under direct vision,  positive ETCO2 and breath sounds checked- equal and bilateral Secured at: 24 cm Tube secured with: Tape Dental Injury: Teeth and Oropharynx as per pre-operative assessment

## 2015-07-29 NOTE — Anesthesia Preprocedure Evaluation (Addendum)
Anesthesia Evaluation  Patient identified by MRN, date of birth, ID band Patient awake    Reviewed: Allergy & Precautions, NPO status , Patient's Chart, lab work & pertinent test results  History of Anesthesia Complications Negative for: history of anesthetic complications  Airway Mallampati: I  TM Distance: >3 FB Neck ROM: Full    Dental  (+) Edentulous Upper, Edentulous Lower   Pulmonary neg pulmonary ROS,    breath sounds clear to auscultation       Cardiovascular negative cardio ROS   Rhythm:Regular Rate:Normal     Neuro/Psych negative neurological ROS     GI/Hepatic negative GI ROS, Neg liver ROS,   Endo/Other  negative endocrine ROS  Renal/GU negative Renal ROS     Musculoskeletal  (+) Arthritis ,   Abdominal (+) - obese, scaphoid   Peds  Hematology negative hematology ROS (+)   Anesthesia Other Findings   Reproductive/Obstetrics                            Anesthesia Physical Anesthesia Plan  ASA: II  Anesthesia Plan: General   Post-op Pain Management:    Induction: Intravenous  Airway Management Planned: Oral ETT  Additional Equipment:   Intra-op Plan:   Post-operative Plan: Extubation in OR  Informed Consent: I have reviewed the patients History and Physical, chart, labs and discussed the procedure including the risks, benefits and alternatives for the proposed anesthesia with the patient or authorized representative who has indicated his/her understanding and acceptance.   Dental advisory given  Plan Discussed with: CRNA and Surgeon  Anesthesia Plan Comments:         Anesthesia Quick Evaluation

## 2015-07-29 NOTE — Brief Op Note (Signed)
07/29/2015  11:45 AM  PATIENT:  Derek Bentley  79 y.o. male  PRE-OPERATIVE DIAGNOSIS:  SPINAL STENOSIS L4-L5 and L3-L4  POST-OPERATIVE DIAGNOSIS:  SPINAL STENOSIS L4-L5 and L3-L4  PROCEDURE:  Procedure(s): DECOMPRESSION L4-L5 and L3-L4,  MICRODISCECTOMY L4-L5     (2 LEVELS) (N/A)  SURGEON:  Surgeon(s) and Role:    * Jene Every, MD - Primary  PHYSICIAN ASSISTANT:   ASSISTANTS: Bissell   ANESTHESIA:   general  EBL:  Total I/O In: 1000 [I.V.:1000] Out: 25 [Blood:25]  BLOOD ADMINISTERED:none  DRAINS: none   LOCAL MEDICATIONS USED:  MARCAINE     SPECIMEN:  No Specimen  DISPOSITION OF SPECIMEN:  N/A  COUNTS:  YES  TOURNIQUET:  * No tourniquets in log *  DICTATION: .Other Dictation: Dictation Number [id number]  PLAN OF CARE: Admit for overnight observation  PATIENT DISPOSITION:  PACU - hemodynamically stable.   Delay start of Pharmacological VTE agent (>24hrs) due to surgical blood loss or risk of bleeding: yes

## 2015-07-30 DIAGNOSIS — K567 Ileus, unspecified: Secondary | ICD-10-CM | POA: Diagnosis not present

## 2015-07-30 DIAGNOSIS — R031 Nonspecific low blood-pressure reading: Secondary | ICD-10-CM | POA: Diagnosis not present

## 2015-07-30 DIAGNOSIS — I959 Hypotension, unspecified: Secondary | ICD-10-CM | POA: Diagnosis present

## 2015-07-30 DIAGNOSIS — M4806 Spinal stenosis, lumbar region: Secondary | ICD-10-CM | POA: Diagnosis not present

## 2015-07-30 DIAGNOSIS — R9389 Abnormal findings on diagnostic imaging of other specified body structures: Secondary | ICD-10-CM | POA: Diagnosis present

## 2015-07-30 DIAGNOSIS — G43A1 Cyclical vomiting, intractable: Secondary | ICD-10-CM | POA: Diagnosis not present

## 2015-07-30 DIAGNOSIS — R1110 Vomiting, unspecified: Secondary | ICD-10-CM | POA: Diagnosis not present

## 2015-07-30 DIAGNOSIS — R509 Fever, unspecified: Secondary | ICD-10-CM | POA: Diagnosis not present

## 2015-07-30 DIAGNOSIS — N400 Enlarged prostate without lower urinary tract symptoms: Secondary | ICD-10-CM | POA: Diagnosis not present

## 2015-07-30 LAB — CBC
HCT: 34.8 % — ABNORMAL LOW (ref 39.0–52.0)
HEMATOCRIT: 34.2 % — AB (ref 39.0–52.0)
HEMOGLOBIN: 11.8 g/dL — AB (ref 13.0–17.0)
HEMOGLOBIN: 11.4 g/dL — AB (ref 13.0–17.0)
MCH: 33.9 pg (ref 26.0–34.0)
MCH: 32.9 pg (ref 26.0–34.0)
MCHC: 33.9 g/dL (ref 30.0–36.0)
MCHC: 33.3 g/dL (ref 30.0–36.0)
MCV: 100 fL (ref 78.0–100.0)
MCV: 98.6 fL (ref 78.0–100.0)
PLATELETS: 144 10*3/uL — AB (ref 150–400)
Platelets: 155 10*3/uL (ref 150–400)
RBC: 3.48 MIL/uL — AB (ref 4.22–5.81)
RBC: 3.47 MIL/uL — AB (ref 4.22–5.81)
RDW: 13.3 % (ref 11.5–15.5)
RDW: 13.1 % (ref 11.5–15.5)
WBC: 8.6 10*3/uL (ref 4.0–10.5)
WBC: 9.6 10*3/uL (ref 4.0–10.5)

## 2015-07-30 LAB — BASIC METABOLIC PANEL
ANION GAP: 5 (ref 5–15)
ANION GAP: 5 (ref 5–15)
BUN: 8 mg/dL (ref 6–20)
BUN: 8 mg/dL (ref 6–20)
CALCIUM: 8.1 mg/dL — AB (ref 8.9–10.3)
CO2: 28 mmol/L (ref 22–32)
CO2: 27 mmol/L (ref 22–32)
Calcium: 8.3 mg/dL — ABNORMAL LOW (ref 8.9–10.3)
Chloride: 106 mmol/L (ref 101–111)
Chloride: 105 mmol/L (ref 101–111)
Creatinine, Ser: 0.84 mg/dL (ref 0.61–1.24)
Creatinine, Ser: 0.87 mg/dL (ref 0.61–1.24)
GFR calc Af Amer: >60 mL/min (ref >60)
GLUCOSE: 128 mg/dL — AB (ref 65–99)
Glucose, Bld: 108 mg/dL — ABNORMAL HIGH (ref 65–99)
POTASSIUM: 4.1 mmol/L (ref 3.5–5.1)
Potassium: 4.1 mmol/L (ref 3.5–5.1)
Sodium: 139 mmol/L (ref 135–145)
Sodium: 137 mmol/L (ref 135–145)

## 2015-07-30 LAB — BRAIN NATRIURETIC PEPTIDE: B NATRIURETIC PEPTIDE 5: 299.2 pg/mL — AB (ref 0.0–100.0)

## 2015-07-30 LAB — LACTIC ACID, PLASMA: Lactic Acid, Venous: 1 mmol/L (ref 0.5–2.0)

## 2015-07-30 MED ORDER — ONDANSETRON HCL 4 MG/2ML IJ SOLN
4.0000 mg | INTRAMUSCULAR | Status: DC | PRN
Start: 2015-07-30 — End: 2015-07-31
  Administered 2015-07-30: 4 mg via INTRAVENOUS
  Filled 2015-07-30: qty 2

## 2015-07-30 MED ORDER — VITAMIN B-12 100 MCG PO TABS
100.0000 ug | ORAL_TABLET | Freq: Every day | ORAL | Status: DC
  Administered 2015-07-30 – 2015-08-03 (×5): 100 ug via ORAL
  Filled 2015-07-30 (×5): qty 1

## 2015-07-30 MED ORDER — TAMSULOSIN HCL 0.4 MG PO CAPS
0.4000 mg | ORAL_CAPSULE | Freq: Once | ORAL | Status: AC
  Administered 2015-07-30: 0.4 mg via ORAL
  Filled 2015-07-30: qty 1

## 2015-07-30 MED ORDER — ONDANSETRON HCL 4 MG PO TABS: 4.0000 mg | ORAL_TABLET | ORAL | Status: DC | PRN

## 2015-07-30 MED ORDER — METOCLOPRAMIDE HCL 5 MG/ML IJ SOLN
10.0000 mg | Freq: Four times a day (QID) | INTRAMUSCULAR | Status: DC | PRN
  Administered 2015-07-30 – 2015-07-31 (×2): 10 mg via INTRAVENOUS
  Filled 2015-07-30 (×2): qty 2

## 2015-07-30 NOTE — Discharge Summary (Signed)
Physician Discharge Summary   Patient ID: Derek Bentley MRN: [id number] DOB/AGE: Aug 29, 1936 79 y.o.  Admit date: 07/29/2015 Discharge date: 07/30/2015  Primary Diagnosis:   SPINAL STENOSIS L4-L5 and L3-L4  Admission Diagnoses:  Past Medical History  Diagnosis Date  . Arthritis    Discharge Diagnoses:   Principal Problem:   Spinal stenosis of lumbar region Active Problems:   Transient hypotension   Abnormal chest x-ray  Procedure:  Procedure(s) (LRB): DECOMPRESSION L4-L5 and L3-L4,  MICRODISCECTOMY L4-L5     (2 LEVELS) (N/A)   Consults: hospitalists- for hypotension  HPI:  see H&P    Laboratory Data: Hospital Outpatient Visit on 07/23/2015  Component Date Value Ref Range Status  . Sodium 07/23/2015 141  135 - 145 mmol/L Final  . Potassium 07/23/2015 3.8  3.5 - 5.1 mmol/L Final  . Chloride 07/23/2015 108  101 - 111 mmol/L Final  . CO2 07/23/2015 27  22 - 32 mmol/L Final  . Glucose, Bld 07/23/2015 132* 65 - 99 mg/dL Final  . BUN 07/23/2015 10  6 - 20 mg/dL Final  . Creatinine, Ser 07/23/2015 0.86  0.61 - 1.24 mg/dL Final  . Calcium 07/23/2015 9.2  8.9 - 10.3 mg/dL Final  . GFR calc non Af Amer 07/23/2015 >60  >60 mL/min Final  . GFR calc Af Amer 07/23/2015 >60  >60 mL/min Final   Comment: (NOTE) The eGFR has been calculated using the CKD EPI equation. This calculation has not been validated in all clinical situations. eGFR's persistently <60 mL/min signify possible Chronic Kidney Disease.   . Anion gap 07/23/2015 6  5 - 15 Final  . WBC 07/23/2015 4.5  4.0 - 10.5 K/uL Final  . RBC 07/23/2015 4.18* 4.22 - 5.81 MIL/uL Final  . Hemoglobin 07/23/2015 13.9  13.0 - 17.0 g/dL Final  . HCT 07/23/2015 41.3  39.0 - 52.0 % Final  . MCV 07/23/2015 98.8  78.0 - 100.0 fL Final  . MCH 07/23/2015 33.3  26.0 - 34.0 pg Final  . MCHC 07/23/2015 33.7  30.0 - 36.0 g/dL Final  . RDW 07/23/2015 13.1  11.5 - 15.5 % Final  . Platelets 07/23/2015 185  150 - 400 K/uL Final  . MRSA,  PCR 07/23/2015 NEGATIVE  NEGATIVE Final  . Staphylococcus aureus 07/23/2015 POSITIVE* NEGATIVE Final   Comment:        The Xpert SA Assay (FDA approved for NASAL specimens in patients over 24 years of age), is one component of a comprehensive surveillance program.  Test performance has been validated by [facility] for patients greater than or equal to 8 year old. It is not intended to diagnose infection nor to guide or monitor treatment.     Recent Labs  07/29/15 2300 07/30/15 0530  HGB 11.8* 11.4*    Recent Labs  07/29/15 2300 07/30/15 0530  WBC 8.6 9.6  RBC 3.48* 3.47*  HCT 34.8*  34.4* 34.2*  PLT 155 144*    Recent Labs  07/29/15 2300 07/30/15 0530  NA 139 137  K 4.1 4.1  CL 106 105  CO2 28 27  BUN 8 8  CREATININE 0.84 0.87  GLUCOSE 108* 128*  CALCIUM 8.1* 8.3*   No results for input(s): LABPT, INR in the last 72 hours.  X-Rays:Dg Lumbar Spine 2-3 Views  07/23/2015   CLINICAL DATA:  Lumbar spine surgery.  EXAM: LUMBAR SPINE - 2-3 VIEW  COMPARISON:  06/03/2015.  FINDINGS: Paraspinal soft tissues normal. Diffuse severe degenerative changes noted of the lumbar  spine. Stable T12 compression fracture. Stable mild scoliosis lumbar spine concave left.  IMPRESSION: Diffuse degenerative changes scoliosis lumbar spine. Stable mild T12 compression fracture. No new changes. No acute abnormality .   Electronically Signed   By: Marcello Moores  Register   On: 07/23/2015 13:59   Dg Chest Port 1 View  07/29/2015   CLINICAL DATA:  Abnormally low blood pressure  EXAM: PORTABLE CHEST 1 VIEW  COMPARISON:  July 15, 2015  FINDINGS: The heart size and mediastinal contours are stable. There is bilateral increased pulmonary interstitium. There is no focal pneumonia or pleural effusion. The visualized skeletal structures are stable.  IMPRESSION: Diffuse increased pulmonary interstitium bilaterally. This is nonspecific but can be seen in interstitial edema or viral pneumonitis.    Electronically Signed   By: Abelardo Diesel M.D.   On: 07/29/2015 23:07   Dg Spine Portable 1 View  07/29/2015   CLINICAL DATA:  Patient for L3-5 lumbar surgery. Intraoperative localization film.  EXAM: PORTABLE SPINE - 1 VIEW  COMPARISON:  Intraoperative localization films earlier today.  FINDINGS: Clamp remains on the L4 spinous process. 3 probes are in place. The most superior projects over the L3-4 foramina. Second more inferior probe projects over the L4-5 foramina. A third probe is at the level of the superior endplate of L5.  IMPRESSION: Localization as above.   Electronically Signed   By: Inge Rise M.D.   On: 07/29/2015 11:40   Dg Spine Portable 1 View  07/29/2015   CLINICAL DATA:  Patient for lumbar surgery L3-5. Intraoperative localization film.  EXAM: PORTABLE SPINE - 1 VIEW  COMPARISON:  Intraoperative localization film earlier today.  FINDINGS: Clamp is now in place along the L4 spinous process. Probe superior to the clamp is directed toward the L3-4 interspace and probe inferior to the spinous process is directed toward the L4-5 interspace.  IMPRESSION: Localization as above.  Clamp is on the L4 spinous process.   Electronically Signed   By: Inge Rise M.D.   On: 07/29/2015 10:53   Dg Spine Portable 1 View  07/29/2015   CLINICAL DATA:  Surgical level L3-4, L4-5.  EXAM: PORTABLE SPINE - 1 VIEW  COMPARISON:  07/23/2015  FINDINGS: Posterior surgical instruments are directed at the L3 and L5 spinous processes  IMPRESSION: Intraoperative localization as above.   Electronically Signed   By: Rolm Baptise M.D.   On: 07/29/2015 10:36    EKG: Orders placed or performed during the hospital encounter of 07/29/15  . ED EKG  . ED EKG     Hospital Course: Patient was admitted to [facility] and taken to the OR and underwent the above state procedure without complications.  Patient tolerated the procedure well and was later transferred to the recovery room and then to the  orthopaedic floor for postoperative care.  They were given PO and IV analgesics for pain control following their surgery.  They were given 24 hours of postoperative antibiotics.   PT was consulted postop to assist with mobility and transfers.  The patient was allowed to be WBAT with therapy and was taught back precautions. Discharge planning was consulted to help with postop disposition and equipment needs.  Patient had a fair night on the evening of surgery and started to get up OOB with therapy on day one. Patient was seen in rounds and was ready to go home on day one.  They were given discharge instructions and dressing directions.  They were instructed on when to follow  up in the office with Dr. Tonita Cong. He was seen by the hospitalists for hypotension prior to D/C which had resolved by that point and was deemed stable for D/C home.   Diet: Regular diet Activity:WBAT Follow-up:in 10-14 days Disposition - Home Discharged Condition: good   Discharge Instructions    Call MD / Call 911    Complete by:  As directed   If you experience chest pain or shortness of breath, CALL 911 and be transported to the hospital emergency room.  If you develope a fever above 101 F, pus (white drainage) or increased drainage or redness at the wound, or calf pain, call your surgeon's office.     Constipation Prevention    Complete by:  As directed   Drink plenty of fluids.  Prune juice may be helpful.  You may use a stool softener, such as Colace (over the counter) 100 mg twice a day.  Use MiraLax (over the counter) for constipation as needed.     Diet - low sodium heart healthy    Complete by:  As directed      Increase activity slowly as tolerated    Complete by:  As directed             Medication List    STOP taking these medications        TURMERIC PO      TAKE these medications        acetaminophen 500 MG tablet  Commonly known as:  TYLENOL  Take 500 mg by mouth every 6 (six) hours as needed for  mild pain or headache.     ascorbic acid 500 MG tablet  Commonly known as:  VITAMIN C  Take 500 mg by mouth daily.     calcium-vitamin D 500-200 MG-UNIT tablet  Commonly known as:  OSCAL WITH D  Take 1 tablet by mouth daily with breakfast.     docusate sodium 100 MG capsule  Commonly known as:  COLACE  Take 1 capsule (100 mg total) by mouth 2 (two) times daily as needed for mild constipation.     FOLIC ACID PO  Take 1 tablet by mouth daily.     HYDROcodone-acetaminophen 5-325 MG tablet  Commonly known as:  NORCO/VICODIN  Take 1 tablet by mouth every 4 (four) hours as needed.     MAGNESIUM PO  Take 1 tablet by mouth daily.     multivitamin with minerals Tabs tablet  Take 1 tablet by mouth daily.     VITAMIN B 12 PO  Take 1 tablet by mouth daily.           Follow-up Information    Follow up with BEANE,JEFFREY C, MD In 2 weeks.   Specialty:  Orthopedic Surgery   Why:  For suture removal   Contact information:   [address] [place] [address] [phone]       Signed: Lacie Draft, PA-C Orthopaedic Surgery 07/30/2015, 11:02 AM

## 2015-07-30 NOTE — Progress Notes (Signed)
Patient refusing to have foley removed. Patient states" they have an overactive bladder and will be using the bathroom too often." Patient reminded of the risks. Patient also told the benefits of early foley removal. This information was relayed to the PA, Avel Peace.

## 2015-07-30 NOTE — Evaluation (Signed)
Physical Therapy Evaluation Patient Details Name: Derek Bentley MRN: [id number] DOB: 1936/07/02 Today's Date: 07/30/2015   History of Present Illness  79 yo male s/p DECOMPRESSION L4-L5 and L3-L4, MICRODISCECTOMY L4-L5due to spinal stenosis  Clinical Impression  Patient is s/p above surgery resulting in the deficits listed below (see PT Problem List).  Patient will benefit from skilled PT to increase their independence and safety with mobility (while adhering to their precautions) to allow discharge to the venue listed below.  Pt educated on log roll technique and back precautions.  Pt will need to practice steps prior to d/c.     Follow Up Recommendations Home health PT;Supervision for mobility/OOB    Equipment Recommendations  None recommended by PT    Recommendations for Other Services       Precautions / Restrictions Precautions Precautions: Fall;Back Precaution Comments: Administerred back precautions handout Restrictions Weight Bearing Restrictions: No      Mobility  Bed Mobility Overal bed mobility: Needs Assistance Bed Mobility: Supine to Sit     Supine to sit: Min guard     General bed mobility comments: verbal cues for log roll technique  Transfers Overall transfer level: Needs assistance Equipment used: Rolling walker (2 wheeled) Transfers: Sit to/from Stand Sit to Stand: Min assist         General transfer comment: verbal cues for precautions and hand placement, assist to rise  Ambulation/Gait Ambulation/Gait assistance: Min guard Ambulation Distance (Feet): 160 Feet Assistive device: Rolling walker (2 wheeled) Gait Pattern/deviations: Step-through pattern;Trunk flexed;Decreased stride length Gait velocity: decreased   General Gait Details: verbal cues for back precautions, increased thoracic and cervical flexion however pt reports hx of this (likely due to spinal stenosis), encouraged upright posture as pt able to tolerate  Stairs             Wheelchair Mobility    Modified Rankin (Stroke Patients Only)       Balance Overall balance assessment: Needs assistance Sitting-balance support: No upper extremity supported;Feet supported Sitting balance-Leahy Scale: Fair     Standing balance support: Bilateral upper extremity supported;During functional activity Standing balance-Leahy Scale: Fair                               Pertinent Vitals/Pain Pain Assessment: Faces Pain Score: 5  Faces Pain Scale: Hurts even more Pain Location: back with mobility Pain Descriptors / Indicators: Sore Pain Intervention(s): Monitored during session;Repositioned    Home Living Family/patient expects to be discharged to:: Private residence Living Arrangements: Spouse/significant other Available Help at Discharge: Family;Available 24 hours/day Type of Home: House Home Access: Stairs to enter Entrance Stairs-Rails: Right Entrance Stairs-Number of Steps: 4 Home Layout: Able to live on main level with bedroom/bathroom Home Equipment: Walker - 2 wheels;Wheelchair - manual;Hospital bed;Shower seat      Prior Function Level of Independence: Independent with assistive device(s)         Comments: has been using RW since last Nov     Hand Dominance   Dominant Hand: Right    Extremity/Trunk Assessment   Upper Extremity Assessment: Generalized weakness           Lower Extremity Assessment: Generalized weakness      Cervical / Trunk Assessment: Kyphotic  Communication   Communication: No difficulties  Cognition Arousal/Alertness: Awake/alert Behavior During Therapy: WFL for tasks assessed/performed Overall Cognitive Status: Within Functional Limits for tasks assessed  General Comments      Exercises        Assessment/Plan    PT Assessment Patient needs continued PT services  PT Diagnosis Difficulty walking;Acute pain   PT Problem List Decreased strength;Decreased  activity tolerance;Decreased mobility;Decreased knowledge of use of DME;Decreased knowledge of precautions;Pain  PT Treatment Interventions Gait training;DME instruction;Functional mobility training;Patient/family education;Therapeutic activities;Therapeutic exercise;Stair training   PT Goals (Current goals can be found in the Care Plan section) Acute Rehab PT Goals Patient Stated Goal: go home today PT Goal Formulation: With patient Time For Goal Achievement: 08/01/15 Potential to Achieve Goals: Good    Frequency Min 5X/week   Barriers to discharge        Co-evaluation               End of Session Equipment Utilized During Treatment: Gait belt Activity Tolerance: Patient tolerated treatment well Patient left: in chair;with call bell/phone within reach;with family/visitor present Nurse Communication: Mobility status    Functional Assessment Tool Used: clinical judgement Functional Limitation: Mobility: Walking and moving around Mobility: Walking and Moving Around Current Status [phone]): At least 20 percent but less than 40 percent impaired, limited or restricted Mobility: Walking and Moving Around Goal Status [phone]): At least 1 percent but less than 20 percent impaired, limited or restricted    Time: 0851-0915 PT Time Calculation (min) (ACUTE ONLY): 24 min   Charges:   PT Evaluation $Initial PT Evaluation Tier I: 1 Procedure PT Treatments $Gait Training: 8-22 mins   PT G Codes:   PT G-Codes **NOT FOR INPATIENT CLASS** Functional Assessment Tool Used: clinical judgement Functional Limitation: Mobility: Walking and moving around Mobility: Walking and Moving Around Current Status (B1478): At least 20 percent but less than 40 percent impaired, limited or restricted Mobility: Walking and Moving Around Goal Status [phone]): At least 1 percent but less than 20 percent impaired, limited or restricted    LEMYRE,KATHrine E 07/30/2015, 12:27 PM Zenovia Jarred, PT,  DPT 07/30/2015 Pager: [phone]

## 2015-07-30 NOTE — Care Management Note (Signed)
Case Management Note  Patient Details  Name: ASPEN DETERDING MRN: [id number] Date of Birth: [birth year]  Subjective/Objective:                    MICRODISCECTOMY L4-L5due to spinal stenosis Action/Plan:  Discharge planning Expected Discharge Date:  07/30/15               Expected Discharge Plan:  [place]  In-House Referral:     Discharge planning Services  CM Consult  Post Acute Care Choice:  Home Health Choice offered to:  Patient  DME Arranged:  3-N-1 DME Agency:  [place]:  PT, OT [facility] Agency:  [place]  Status of Service:  Completed, signed off  Medicare Important Message Given:    Date Medicare IM Given:    Medicare IM give by:    Date Additional Medicare IM Given:    Additional Medicare Important Message give by:     If discussed at [place] of Stay Meetings, dates discussed:    Additional Comments: CM met with pt in room to offer choice of home health agency.  Pt chooses [facility] to render HHPT/OT.  Orders and face to face have been requested.  Referral made to [facility] rep, Kristen.  CM called [facility] DME rep, Lecretia to please deliver the 3n1 to room.  No other CM needs were communicated.  Dellie Catholic, RN 07/30/2015, 3:22 PM

## 2015-07-30 NOTE — Progress Notes (Signed)
[facility] PROGRESS NOTE    Progress Note   GERAL COKER ZOX:[id number] DOB: 1936-01-20 DOA: 2015/08/04 PCP: Thayer Headings, MD   Brief Narrative:   Derek Bentley is an 79 y.o. male past medical history of spinal stenosis and admitted on 2015-08-04 for surgical intervention for this current problem. We're consulted for transient hypotension  Assessment/Plan:   Principal Problem:   Spinal stenosis of lumbar region Active Problems:   Transient hypotension   Abnormal chest x-ray Her blood pressures remain greater than 100s, she has remained asymptomatic have to agree with Dr. Felipa Furnace evaluation that her transient hypotension could've been due to fluid shifts and IV narcotic administration. Seems like her pain is controlled on oral narcotics. She has remained afebrile, with no leukocytosis and her hemoglobin is stable. KVO her IV fluids. BMP is 300 which is undetermined she has no JVD no lower extremity edema. From my standpoint he should be able to be discharged, will sign off.    DVT Prophylaxis - Lovenox ordered.  Family Communication: none Disposition Plan: Per orthopedic surgery. Code Status:     Code Status Orders        Start     Ordered   2015/08/04 1325  Full code   Continuous     04-Aug-2015 1324    Advance Directive Documentation        Most Recent Value   Type of Advance Directive  Healthcare Power of Attorney, Living will   Pre-existing out of facility DNR order (yellow form or pink MOST form)     "MOST" Form in Place?          IV Access:    Peripheral IV   Procedures and diagnostic studies:   Dg Chest Port 1 View  Aug 04, 2015   CLINICAL DATA:  Abnormally low blood pressure  EXAM: PORTABLE CHEST 1 VIEW  COMPARISON:  July 15, 2015  FINDINGS: The heart size and mediastinal contours are stable. There is bilateral increased pulmonary interstitium. There is no focal pneumonia or pleural effusion. The visualized skeletal structures are stable.   IMPRESSION: Diffuse increased pulmonary interstitium bilaterally. This is nonspecific but can be seen in interstitial edema or viral pneumonitis.   Electronically Signed   By: Sherian Rein M.D.   On: 08/04/2015 23:07   Dg Spine Portable 1 View  2015-08-04   CLINICAL DATA:  Patient for L3-5 lumbar surgery. Intraoperative localization film.  EXAM: PORTABLE SPINE - 1 VIEW  COMPARISON:  Intraoperative localization films earlier today.  FINDINGS: Clamp remains on the L4 spinous process. 3 probes are in place. The most superior projects over the L3-4 foramina. Second more inferior probe projects over the L4-5 foramina. A third probe is at the level of the superior endplate of L5.  IMPRESSION: Localization as above.   Electronically Signed   By: Drusilla Kanner M.D.   On: Aug 04, 2015 11:40   Dg Spine Portable 1 View  2015/08/04   CLINICAL DATA:  Patient for lumbar surgery L3-5. Intraoperative localization film.  EXAM: PORTABLE SPINE - 1 VIEW  COMPARISON:  Intraoperative localization film earlier today.  FINDINGS: Clamp is now in place along the L4 spinous process. Probe superior to the clamp is directed toward the L3-4 interspace and probe inferior to the spinous process is directed toward the L4-5 interspace.  IMPRESSION: Localization as above.  Clamp is on the L4 spinous process.   Electronically Signed   By: Drusilla Kanner M.D.   On: 08/04/2015 10:53   Dg Spine Portable  1 View  07/29/2015   CLINICAL DATA:  Surgical level L3-4, L4-5.  EXAM: PORTABLE SPINE - 1 VIEW  COMPARISON:  07/23/2015  FINDINGS: Posterior surgical instruments are directed at the L3 and L5 spinous processes  IMPRESSION: Intraoperative localization as above.   Electronically Signed   By: Charlett Nose M.D.   On: 07/29/2015 10:36     Medical Consultants:      Anti-Infectives:   Anti-infectives    Start     Dose/Rate Route Frequency Ordered Stop   07/29/15 1600  ceFAZolin (ANCEF) IVPB 2 g/50 mL premix     2 g 100 mL/hr over 30  Minutes Intravenous Every 8 hours 07/29/15 1324 07/30/15 0914   07/29/15 1024  polymyxin B 500,000 Units, bacitracin 50,000 Units in sodium chloride irrigation 0.9 % 500 mL irrigation  Status:  Discontinued       As needed 07/29/15 1024 07/29/15 1203   07/29/15 0830  clindamycin (CLEOCIN) IVPB 900 mg     900 mg 100 mL/hr over 30 Minutes Intravenous On call to O.R. 07/29/15 0818 07/29/15 0952   07/29/15 0830  ceFAZolin (ANCEF) IVPB 2 g/50 mL premix     2 g 100 mL/hr over 30 Minutes Intravenous On call to O.R. 07/29/15 0818 07/29/15 0950      Subjective:    Clarice Pole has no new complaints feels great. He relates he's never had a problem with blood pressure in the past. He does not take any antihypertensive medication.  Objective:    Filed Vitals:   07/30/15 0124 07/30/15 0338 07/30/15 0503 07/30/15 0722  BP: 100/54 107/57 101/58 101/59  Pulse: 69 76 72 65  Temp: 98.8 F (37.1 C) 98 F (36.7 C) 97.9 F (36.6 C) 98.3 F (36.8 C)  TempSrc: Oral Oral Oral Oral  Resp: Height:      Weight:      SpO2: 100% 99% 99% 100%    Intake/Output Summary (Last 24 hours) at 07/30/15 1031 Last data filed at 07/30/15 0830  Gross per 24 hour  Intake 2958.33 ml  Output    795 ml  Net 2163.33 ml   Filed Weights   07/29/15 0913 07/29/15 1314  Weight: 58.832 kg (129 lb 11.2 oz) 58.514 kg (129 lb)    Exam: Gen:  NAD Cardiovascular:  RRR, No M/R/G Respiratory:  Lungs CTAB Gastrointestinal:  Abdomen soft, NT/ND, + BS Extremities:  No C/E/C   Data Reviewed:    Labs: Basic Metabolic Panel:  Recent Labs Lab 07/23/15 1100 07/29/15 2300 07/30/15 0530  NA 141 139 137  K 3.8 4.1 4.1  CL 108 106 105  CO2 GLUCOSE 132* 108* 128*  BUN CREATININE 0.86 0.84 0.87  CALCIUM 9.2 8.1* 8.3*   GFR Estimated Creatinine Clearance: 59.8 mL/min (by C-G formula based on Cr of 0.87). Liver Function Tests: No results for input(s): AST, ALT, ALKPHOS, BILITOT,  PROT, ALBUMIN in the last 168 hours. No results for input(s): LIPASE, AMYLASE in the last 168 hours. No results for input(s): AMMONIA in the last 168 hours. Coagulation profile No results for input(s): INR, PROTIME in the last 168 hours.  CBC:  Recent Labs Lab 07/23/15 1100 07/29/15 2300 07/30/15 0530  WBC 4.5 8.6 9.6  HGB 13.9 11.8* 11.4*  HCT 41.3 34.8*  34.4* 34.2*  MCV 98.8 100.0 98.6  PLT 185 155 144*   Cardiac Enzymes: No results for input(s): CKTOTAL, CKMB, CKMBINDEX,  TROPONINI in the last 168 hours. BNP (last 3 results) No results for input(s): PROBNP in the last 8760 hours. CBG: No results for input(s): GLUCAP in the last 168 hours. D-Dimer: No results for input(s): DDIMER in the last 72 hours. Hgb A1c: No results for input(s): HGBA1C in the last 72 hours. Lipid Profile: No results for input(s): CHOL, HDL, LDLCALC, TRIG, CHOLHDL, LDLDIRECT in the last 72 hours. Thyroid function studies: No results for input(s): TSH, T4TOTAL, T3FREE, THYROIDAB in the last 72 hours.  Invalid input(s): FREET3 Anemia work up: No results for input(s): VITAMINB12, FOLATE, FERRITIN, TIBC, IRON, RETICCTPCT in the last 72 hours. Sepsis Labs:  Recent Labs Lab 07/23/15 1100 07/29/15 2300 07/30/15 0530  WBC 4.5 8.6 9.6  LATICACIDVEN  --  1.0  --    Microbiology Recent Results (from the past 240 hour(s))  Surgical pcr screen     Status: Abnormal   Collection Time: 07/23/15 10:24 AM  Result Value Ref Range Status   MRSA, PCR NEGATIVE NEGATIVE Final   Staphylococcus aureus POSITIVE (A) NEGATIVE Final    Comment:        The Xpert SA Assay (FDA approved for NASAL specimens in patients over 65 years of age), is one component of a comprehensive surveillance program.  Test performance has been validated by [facility] for patients greater than or equal to 18 year old. It is not intended to diagnose infection nor to guide or monitor treatment.      Medications:   .  acidophilus  1 capsule Oral Daily  . docusate sodium  100 mg Oral BID  . folic acid  1 mg Oral Daily  . vitamin B-12   Oral Daily  . ascorbic acid  500 mg Oral Daily   Continuous Infusions: . dextrose 5 % and 0.45 % NaCl with KCl 20 mEq/L 50 mL/hr (07/29/15 1538)    Time spent: 15 min   LOS: 1 day   Marinda Elk  [facility] Pager [phone]  *Please refer to [email or web address], password [email or web address] to get updated schedule on who will round on this patient, as hospitalists switch teams weekly. If 7PM-7AM, please contact night-coverage at [email or web address], password [email or web address] for any overnight needs.  07/30/2015, 10:31 AM

## 2015-07-30 NOTE — Op Note (Signed)
NAMEULICES, MAACK                ACCOUNT NO.:  [id number]  MEDICAL RECORD NO.:  [id number]  LOCATION:  1621                         FACILITY:  [facility]  PHYSICIAN:  Jene Every, M.D.    DATE OF BIRTH:  January 18, 1936  DATE OF PROCEDURE:  07/29/2015 DATE OF DISCHARGE:                              OPERATIVE REPORT   PREOPERATIVE DIAGNOSES:  Spinal stenosis, L3-L4, L4-L5.  Herniated nucleus pulposus at L4-L5.  POSTOPERATIVE DIAGNOSES:  Spinal stenosis, L3-L4, L4-L5.  Herniated nucleus pulposus at L4-L5.  PROCEDURES PERFORMED: 1. Microlumbar decompression, L3-L4, L4-L5 with bilateral laminotomies     at L3-L4 and at L4-L5. 2. Foraminotomies of L3, L4, and L5 bilaterally. 3. Gill laminectomy of L4.  ANESTHESIA:  General.  SURGEON:  Jene Every, M.D.  ASSISTANT:  Lanna Poche, PA.  HISTORY:  A 79 year old with severe spinal stenosis, L4-L5 and L3-L4, disk herniation, and neural foraminal stenosis at L4-L5, bilateral radicular pain refractory to conservative treatment, neural tension signs, weakness.  He was indicated for decompression.  Risks and benefits were discussed including bleeding, infection, damage to neurovascular structures, no change in symptoms, worsening symptoms, DVT, PE, anesthetic complications, etc.  TECHNIQUE:  With the patient in supine position, after induction of adequate general anesthesia, 2 g Kefzol, the patient was placed prone on the [place] frame.  All bony prominences were well padded.  Foley to gravity.  Lumbar region was prepped and draped in usual sterile fashion. Two 18-gauge spinal needles were utilized to localize the L3-L4 and L4- L5 interspace, confirmed with x-ray.  An incision was made from the spinous process of L3 to below L5.  Subcutaneous tissue was dissected. Electrocautery was utilized to achieve hemostasis.  Dorsolumbar fascia was identified, divided in line with skin incision.  Paraspinous muscle was elevated from lamina  L3-L4, L4-L5.  McCullough retractor was placed. Confirmatory radiograph obtained.  Operating microscope was draped and brought onto the surgical field.  A Leksell rongeur was utilized to remove the spinous process of partial L3, L4, and partial L5.  We performed hemilaminotomies in the caudad edge of L4 with a 2-mm Kerrison bilaterally, first centrally removing the neural arch.  Then, we removed the ligamentum flavum from the interspace at L3-L4 with neuro patties placed beneath the ligamentum flavum, protecting the dura at all times. We performed hemilaminotomies at the caudad edge of L3, detached the ligamentum flavum.  Neuro patties were placed.  We decompressed the lateral recess to the medial border of the pedicles again protecting neural elements.  There was severe lateral recess stenosis bilaterally secondary to ligamentum flavum and facet hypertrophy.  We then continued distally decompressing the lateral recesses to the medial border of the pedicle bilaterally.  We then detached the ligamentum flavum from the cephalad edge of L5.  We then performed hemilaminotomies and foraminotomies of L5 bilaterally identifying the L5 root.  There was a disk herniation noted at L4-L5 on the right.  With the neural elements well protected, I performed an annulotomy, and a small disk __________ was removed.  Majority of the disk was a hardened disk.  We performed foraminotomies of L4 bilaterally with severe stenosis, right greater than left and foraminotomies at L3.  Neuro probe passed freely up the foramen of L3, L4, and L5 bilaterally after the decompression.  Again, majority of the stenosis was at the foramen L4 and L5 bilaterally. After performed foraminotomies and decompressing the lateral recess, we had good restoration of the thecal sac.  Neural probe passed freely up the foramen of L4 and L5 as well as L3.  No residual neural compression. No residual disk herniation.  We copiously irrigated  the laminectomy defects.  No evidence of CSF leakage or active bleeding.  We placed a thrombin-soaked Gelfoam in the laminotomy defects.  We removed the McCullough retractor, irrigated the paraspinous musculature with no evidence of active bleeding.  Dorsolumbar fascia was reapproximated with #1 Vicryl, subcu with 2-0, and skin with staples.  Wound was dressed sterilely.  We placed him supine on the hospital bed, extubated without difficulty, and transported to the recovery room in satisfactory condition.  The patient tolerated the procedure well.  There were no complications.  There was minimal blood loss.     Jene Every, M.D.     Cordelia Pen  D:  07/29/2015  T:  07/29/2015  Job:  [id number]

## 2015-07-30 NOTE — Progress Notes (Signed)
Subjective: 1 Day Post-Op Procedure(s) (LRB): DECOMPRESSION L4-L5 and L3-L4,  MICRODISCECTOMY L4-L5     (2 LEVELS) (N/A) Patient reports pain as 4 on 0-10 scale.    Objective: Vital signs in last 24 hours: Temp:  [97.4 F (36.3 C)-98.8 F (37.1 C)] 98.3 F (36.8 C) (09/29 0722) Pulse Rate:  [56-77] 65 (09/29 0722) Resp:  [9-20] 14 (09/29 0722) BP: (89-132)/(41-78) 101/59 mmHg (09/29 0722) SpO2:  [94 %-100 %] 100 % (09/29 0722) Weight:  [58.514 kg (129 lb)] 58.514 kg (129 lb) (09/28 1314)  Intake/Output from previous day: 09/28 0701 - 09/29 0700 In: 2883.3 [P.O.:660; I.V.:2118.3; IV Piggyback:105] Out: 795 [Urine:770; Blood:25] Intake/Output this shift: Total I/O In: 75 [Other:75] Out: -    Recent Labs  07/29/15 2300 07/30/15 0530  HGB 11.8* 11.4*    Recent Labs  07/29/15 2300 07/30/15 0530  WBC 8.6 9.6  RBC 3.48* 3.47*  HCT 34.8*  34.4* 34.2*  PLT 155 144*    Recent Labs  07/29/15 2300 07/30/15 0530  NA 139 137  K 4.1 4.1  CL 106 105  CO2 28 27  BUN 8 8  CREATININE 0.84 0.87  GLUCOSE 108* 128*  CALCIUM 8.1* 8.3*   No results for input(s): LABPT, INR in the last 72 hours.  Neurologically intact Sensation intact distally Intact pulses distally Dorsiflexion/Plantar flexion intact Compartment soft  Assessment/Plan: 1 Day Post-Op Procedure(s) (LRB): DECOMPRESSION L4-L5 and L3-L4,  MICRODISCECTOMY L4-L5     (2 LEVELS) (N/A) Advance diet D/C IV fluids Discharge home with home health possible if BP stable  BEANE,JEFFREY C 07/30/2015, 9:22 AM

## 2015-07-30 NOTE — Evaluation (Signed)
Occupational Therapy Evaluation Patient Details Name: Derek Bentley MRN: [id number] DOB: [birth year] Today's Date: 07/30/2015    History of Present Illness 79 yo male s/p DECOMPRESSION L4-L5 and L3-L4, MICRODISCECTOMY L4-L5    Clinical Impression   Patient presenting with decreased ADL and functional mobility independence secondary to above. Patient mod I with RW and required assistance with LB ADLs PTA. Patient currently functioning at an overall min>max assist level. Patient will benefit from acute OT to increase overall independence in the areas of ADLs, functional mobility, education on back precautions, and overall safety in order to safely discharge home with wife and HHOT.     Follow Up Recommendations  Home health OT;Supervision/Assistance - 24 hour    Equipment Recommendations  3 in 1 bedside comode;Other (comment) (AE - reacher, sock aid, LH sponge)    Recommendations for Other Services  None at this time   Precautions / Restrictions Precautions Precautions: Fall;Back (high fall risk) Precaution Comments: Administerred back precautions handout Restrictions Weight Bearing Restrictions: No    Mobility Bed Mobility General bed mobility comments: Pt found seated in recliner upon OT entering/exiting room, see PT eval for more info  Transfers Overall transfer level: Needs assistance Equipment used: Rolling walker (2 wheeled) Transfers: Sit to/from Stand Sit to Stand: Min assist General transfer comment: Heavy min assist for sit<>stands. Cues for hand placement and techniqu     Balance Overall balance assessment: Needs assistance Sitting-balance support: No upper extremity supported;Feet supported Sitting balance-Leahy Scale: Fair     Standing balance support: Bilateral upper extremity supported;During functional activity Standing balance-Leahy Scale: Fair    ADL Overall ADL's : Needs assistance/impaired Eating/Feeding: Set up;Sitting   Grooming: Set  up;Sitting   Upper Body Bathing: Set up;Sitting   Lower Body Bathing: Moderate assistance;Sit to/from stand   Upper Body Dressing : Set up;Sitting   Lower Body Dressing: Maximal assistance;Sit to/from stand   Toilet Transfer: Minimal assistance;BSC;RW General ADL Comments: Pt unable to reach BLEs for LB ADLs. Pt reports that his wife has been assisting with ADLs since Novemenber. Went over AE to increase independence with LB ADLs and plan to introduce them during next OT session tomorrow if pt still here. Pt stood from recliner and ambulated recliner> BR for toielt transfer using BSC over toilet seat. Pt ambulated back to recliner. Cues for safety with RW during functional mobility needed, especially in tight spaces.     Pertinent Vitals/Pain Pain Assessment: Faces Pain Score: 5  Faces Pain Scale: Hurts even more Pain Location: back with mobility Pain Descriptors / Indicators: Sore Pain Intervention(s): Monitored during session;Repositioned     Hand Dominance Right   Extremity/Trunk Assessment Upper Extremity Assessment Upper Extremity Assessment: Generalized weakness   Lower Extremity Assessment Lower Extremity Assessment: Defer to PT evaluation   Cervical / Trunk Assessment Cervical / Trunk Assessment: Kyphotic   Communication Communication Communication: No difficulties   Cognition Arousal/Alertness: Awake/alert Behavior During Therapy: WFL for tasks assessed/performed Overall Cognitive Status: Within Functional Limits for tasks assessed              Home Living Family/patient expects to be discharged to:: Private residence Living Arrangements: Spouse/significant other Available Help at Discharge: Family;Available 24 hours/day Type of Home: House Home Access: Stairs to enter [employer] of Steps: 4 Entrance Stairs-Rails: Right Home Layout: Able to live on main level with bedroom/bathroom     Bathroom Shower/Tub: Walk-in shower;Door   [employer]  Toilet: Standard     Home Equipment: Environmental consultant - 2 wheels;Wheelchair -  manual;Hospital bed;Shower seat   Prior Functioning/Environment Level of Independence: Independent with assistive device(s)  Comments: has been using RW since last Nov    OT Diagnosis: Generalized weakness;Acute pain   OT Problem List: Decreased strength;Decreased activity tolerance;Impaired balance (sitting and/or standing);Decreased safety awareness;Decreased knowledge of use of DME or AE;Decreased knowledge of precautions;Pain   OT Treatment/Interventions: Self-care/ADL training;Therapeutic exercise;Energy conservation;DME and/or AE instruction;Therapeutic activities;Patient/family education;Balance training    OT Goals(Current goals can be found in the care plan section) Acute Rehab OT Goals Patient Stated Goal: go home today OT Goal Formulation: With patient/family Time For Goal Achievement: 08/13/15 Potential to Achieve Goals: Good ADL Goals Pt Will Perform Grooming: with modified independence;standing Pt Will Perform Lower Body Bathing: with modified independence;sit to/from stand;with adaptive equipment Pt Will Perform Lower Body Dressing: with modified independence;with adaptive equipment;sit to/from stand Pt Will Transfer to Toilet: with modified independence;ambulating;bedside commode Pt Will Perform Tub/Shower Transfer: Shower transfer;shower seat;rolling walker;with modified independence;ambulating Additional ADL Goal #1: Pt will be able to verbalize and adhere to back precautions (no bending, arching, twisting) 100% of the time during functional tasks/transfers  OT Frequency: Min 2X/week   Barriers to D/C: None known at this time   End of Session Equipment Utilized During Treatment: Gait belt;Rolling walker Nurse Communication: Other (comment) (need for BSC upon d/c)  Activity Tolerance: Patient tolerated treatment well Patient left: in chair;with call bell/phone within reach;with family/visitor  present   Time: 1610-9604 OT Time Calculation (min): 28 min Charges:  OT General Charges $OT Visit: 1 Procedure OT Evaluation $Initial OT Evaluation Tier I: 1 Procedure OT Treatments $Self Care/Home Management : 8-22 mins G-Codes: OT G-codes **NOT FOR INPATIENT CLASS** Functional Limitation: Self care Self Care Current Status (V4098): At least 40 percent but less than 60 percent impaired, limited or restricted Self Care Goal Status (J1914): At least 1 percent but less than 20 percent impaired, limited or restricted  Bona Hubbard , MS, OTR/L, CLT Pager: [phone]  07/30/2015, 10:25 AM

## 2015-07-30 NOTE — Progress Notes (Signed)
Physical Therapy Treatment Note    07/30/15 1500  PT Visit Information  Last PT Received On 07/30/15  Assistance Needed +1  History of Present Illness 79 yo male s/p DECOMPRESSION L4-L5 and L3-L4, MICRODISCECTOMY L4-L5due to spinal stenosis  PT Time Calculation  PT Start Time (ACUTE ONLY) 1415  PT Stop Time (ACUTE ONLY) 1434  PT Time Calculation (min) (ACUTE ONLY) 19 min  Subjective Data  Subjective Pt up in room with spouse upon arrival.  Pt ambulated in hallway and practiced steps.  Pt feels ready for d/c home today.  Reviewed back precautions verbally and demonstrated.  Pt had no further questions.  Precautions  Precautions Fall;Back  Pain Assessment  Pain Assessment 0-10  Pain Score 7  Pain Location back  Pain Descriptors / Indicators Sore  Pain Intervention(s) Limited activity within patient's tolerance;Monitored during session  Cognition  Arousal/Alertness Awake/alert  Behavior During Therapy WFL for tasks assessed/performed  Overall Cognitive Status Within Functional Limits for tasks assessed  Bed Mobility  General bed mobility comments pt up in room on arrival  Transfers  Overall transfer level Needs assistance  Equipment used Rolling walker (2 wheeled)  Transfers Sit to/from Stand  Sit to Stand Min guard  General transfer comment verbal cues for precautions and hand placement  Ambulation/Gait  Ambulation/Gait assistance Min guard  Ambulation Distance (Feet) 140 Feet  Assistive device Rolling walker (2 wheeled)  Gait Pattern/deviations Step-through pattern;Trunk flexed  General Gait Details verbal cues for back precautions, increased thoracic and cervical flexion however pt reports hx of this (likely due to spinal stenosis), encouraged upright posture as pt able to tolerate  Gait velocity decreased  Stairs Yes  Stairs assistance Min guard  Stair Management Forwards;Step to pattern;Two rails  Number of Stairs 3  General stair comments verbal cues for safe  technique, pt did not require any physical assist, spouse present and observed  PT - End of Session  Activity Tolerance Patient tolerated treatment well  Patient left in chair;with call bell/phone within reach;with family/visitor present  PT - Assessment/Plan  PT Plan Current plan remains appropriate  PT Frequency (ACUTE ONLY) Min 5X/week  Follow Up Recommendations Home health PT;Supervision for mobility/OOB  PT equipment None recommended by PT  PT Goal Progression  Progress towards PT goals Progressing toward goals  PT General Charges  $$ ACUTE PT VISIT 1 Procedure  PT Treatments  $Gait Training 8-22 mins   Zenovia Jarred, PT, DPT 07/30/2015 Pager: [phone]

## 2015-07-30 NOTE — Plan of Care (Signed)
Problem: Discharge Progression Outcomes Goal: Complications resolved/controlled Outcome: Not Progressing Nausea  Still/ unable to urinate

## 2015-07-31 ENCOUNTER — Ambulatory Visit (HOSPITAL_COMMUNITY): Payer: Medicare Other

## 2015-07-31 ENCOUNTER — Ambulatory Visit (HOSPITAL_BASED_OUTPATIENT_CLINIC_OR_DEPARTMENT_OTHER): Payer: Medicare Other

## 2015-07-31 DIAGNOSIS — M79605 Pain in left leg: Secondary | ICD-10-CM | POA: Diagnosis not present

## 2015-07-31 DIAGNOSIS — N400 Enlarged prostate without lower urinary tract symptoms: Secondary | ICD-10-CM | POA: Diagnosis not present

## 2015-07-31 DIAGNOSIS — R1110 Vomiting, unspecified: Secondary | ICD-10-CM | POA: Diagnosis not present

## 2015-07-31 DIAGNOSIS — M79604 Pain in right leg: Secondary | ICD-10-CM

## 2015-07-31 DIAGNOSIS — R338 Other retention of urine: Secondary | ICD-10-CM

## 2015-07-31 DIAGNOSIS — R112 Nausea with vomiting, unspecified: Secondary | ICD-10-CM

## 2015-07-31 DIAGNOSIS — N401 Enlarged prostate with lower urinary tract symptoms: Secondary | ICD-10-CM | POA: Diagnosis not present

## 2015-07-31 DIAGNOSIS — M4806 Spinal stenosis, lumbar region: Secondary | ICD-10-CM | POA: Diagnosis not present

## 2015-07-31 LAB — CBC
HEMATOCRIT: 36.8 % — AB (ref 39.0–52.0)
Hemoglobin: 12.7 g/dL — ABNORMAL LOW (ref 13.0–17.0)
MCH: 33.6 pg (ref 26.0–34.0)
MCHC: 34.5 g/dL (ref 30.0–36.0)
MCV: 97.4 fL (ref 78.0–100.0)
PLATELETS: 152 10*3/uL (ref 150–400)
RBC: 3.78 MIL/uL — AB (ref 4.22–5.81)
RDW: 12.6 % (ref 11.5–15.5)
WBC: 13.8 10*3/uL — AB (ref 4.0–10.5)

## 2015-07-31 LAB — URINALYSIS, ROUTINE W REFLEX MICROSCOPIC
Bilirubin Urine: NEGATIVE
GLUCOSE, UA: NEGATIVE mg/dL
Ketones, ur: 15 mg/dL — AB
LEUKOCYTES UA: NEGATIVE
Nitrite: NEGATIVE
PH: 7 (ref 5.0–8.0)
Protein, ur: NEGATIVE mg/dL
SPECIFIC GRAVITY, URINE: 1.012 (ref 1.005–1.030)
Urobilinogen, UA: 0.2 mg/dL (ref 0.0–1.0)

## 2015-07-31 LAB — COMPREHENSIVE METABOLIC PANEL
ALBUMIN: 3.3 g/dL — AB (ref 3.5–5.0)
ALT: 11 U/L — AB (ref 17–63)
AST: 28 U/L (ref 15–41)
Alkaline Phosphatase: 65 U/L (ref 38–126)
Anion gap: 7 (ref 5–15)
BUN: 15 mg/dL (ref 6–20)
CHLORIDE: 96 mmol/L — AB (ref 101–111)
CO2: 25 mmol/L (ref 22–32)
Calcium: 8.5 mg/dL — ABNORMAL LOW (ref 8.9–10.3)
Creatinine, Ser: 0.78 mg/dL (ref 0.61–1.24)
GFR calc Af Amer: >60 mL/min (ref >60)
GFR calc non Af Amer: >60 mL/min (ref >60)
GLUCOSE: 102 mg/dL — AB (ref 65–99)
POTASSIUM: 4 mmol/L (ref 3.5–5.1)
Sodium: 128 mmol/L — ABNORMAL LOW (ref 135–145)
Total Bilirubin: 0.9 mg/dL (ref 0.3–1.2)
Total Protein: 5.9 g/dL — ABNORMAL LOW (ref 6.5–8.1)

## 2015-07-31 LAB — URINE MICROSCOPIC-ADD ON

## 2015-07-31 MED ORDER — VANCOMYCIN HCL IN DEXTROSE 750-5 MG/150ML-% IV SOLN
750.0000 mg | Freq: Two times a day (BID) | INTRAVENOUS | Status: DC
  Filled 2015-07-31: qty 150

## 2015-07-31 MED ORDER — POLYETHYLENE GLYCOL 3350 17 G PO PACK
17.0000 g | PACK | Freq: Every day | ORAL | Status: DC
  Administered 2015-07-31: 17 g via ORAL
  Filled 2015-07-31 (×2): qty 1

## 2015-07-31 MED ORDER — LIP MEDEX EX OINT
TOPICAL_OINTMENT | CUTANEOUS | Status: AC
  Filled 2015-07-31: qty 7

## 2015-07-31 MED ORDER — SODIUM CHLORIDE 0.9 % IV SOLN
INTRAVENOUS | Status: AC
  Administered 2015-07-31 – 2015-08-01 (×2): via INTRAVENOUS

## 2015-07-31 MED ORDER — SACCHAROMYCES BOULARDII 250 MG PO CAPS
250.0000 mg | ORAL_CAPSULE | Freq: Two times a day (BID) | ORAL | Status: DC
  Administered 2015-08-01 – 2015-08-03 (×5): 250 mg via ORAL
  Filled 2015-07-31 (×8): qty 1

## 2015-07-31 MED ORDER — SODIUM CHLORIDE 0.9 % IV BOLUS (SEPSIS)
500.0000 mL | Freq: Once | INTRAVENOUS | Status: AC
  Administered 2015-07-31: 500 mL via INTRAVENOUS

## 2015-07-31 MED ORDER — PANTOPRAZOLE SODIUM 40 MG PO TBEC
40.0000 mg | DELAYED_RELEASE_TABLET | Freq: Every day | ORAL | Status: DC
  Administered 2015-07-31 – 2015-08-03 (×4): 40 mg via ORAL
  Filled 2015-07-31 (×4): qty 1

## 2015-07-31 MED ORDER — PROMETHAZINE HCL 25 MG/ML IJ SOLN
6.2500 mg | Freq: Four times a day (QID) | INTRAMUSCULAR | Status: DC | PRN
  Administered 2015-07-31: 12.5 mg via INTRAVENOUS
  Filled 2015-07-31: qty 1

## 2015-07-31 MED ORDER — ONDANSETRON HCL 4 MG/2ML IJ SOLN
4.0000 mg | Freq: Four times a day (QID) | INTRAMUSCULAR | Status: DC
  Administered 2015-07-31 – 2015-08-01 (×7): 4 mg via INTRAVENOUS
  Filled 2015-07-31 (×7): qty 2

## 2015-07-31 MED ORDER — VANCOMYCIN HCL IN DEXTROSE 1-5 GM/200ML-% IV SOLN
1000.0000 mg | Freq: Once | INTRAVENOUS | Status: AC
  Administered 2015-07-31: 1000 mg via INTRAVENOUS
  Filled 2015-07-31: qty 200

## 2015-07-31 MED ORDER — CEFTAZIDIME 2 G IJ SOLR
2.0000 g | Freq: Three times a day (TID) | INTRAMUSCULAR | Status: DC
  Administered 2015-07-31: 2 g via INTRAVENOUS
  Filled 2015-07-31 (×2): qty 2

## 2015-07-31 MED ORDER — TAMSULOSIN HCL 0.4 MG PO CAPS: 0.4000 mg | ORAL_CAPSULE | Freq: Every day | ORAL | Status: DC

## 2015-07-31 MED ORDER — SODIUM CHLORIDE 0.9 % IV SOLN
INTRAVENOUS | Status: DC
  Administered 2015-07-31: 50 mL/h via INTRAVENOUS

## 2015-07-31 MED ORDER — TAMSULOSIN HCL 0.4 MG PO CAPS
0.4000 mg | ORAL_CAPSULE | Freq: Every day | ORAL | Status: DC
  Administered 2015-07-31 – 2015-08-02 (×3): 0.4 mg via ORAL
  Filled 2015-07-31 (×4): qty 1

## 2015-07-31 NOTE — Progress Notes (Signed)
*  Preliminary Results* Bilateral lower extremity venous duplex completed. Bilateral lower extremities are negative for deep vein thrombosis. There is no evidence of Baker's cyst bilaterally.  07/31/2015 3:27 PM  Gertie Fey, RVT, RDCS, RDMS

## 2015-07-31 NOTE — Progress Notes (Signed)
Physical Therapy Treatment Patient Details Name: Derek Bentley MRN: [id number] DOB: [birth year] Today's Date: 07/31/2015    History of Present Illness 79 yo male s/p DECOMPRESSION L4-L5 and L3-L4, MICRODISCECTOMY L4-L5due to spinal stenosis    PT Comments    Pt reports not feeling very well today.  Ambulation distance limited due to fatigue and pain.  Follow Up Recommendations  Home health PT;Supervision for mobility/OOB     Equipment Recommendations  None recommended by PT    Recommendations for Other Services       Precautions / Restrictions Precautions Precautions: Fall;Back Precaution Comments: reviewed back precautions during physical activity Restrictions Weight Bearing Restrictions: No    Mobility  Bed Mobility Overal bed mobility: Needs Assistance Bed Mobility: Rolling;Sidelying to Sit Rolling: Min assist Sidelying to sit: Min assist   Sit to supine: Mod assist   General bed mobility comments: verbal cues for log roll technique, assist for lower body  Transfers Overall transfer level: Needs assistance Equipment used: Rolling walker (2 wheeled) Transfers: Sit to/from Stand Sit to Stand: Min guard         General transfer comment: verbal cues for precautions and hand placement  Ambulation/Gait Ambulation/Gait assistance: Min guard Ambulation Distance (Feet): 30 Feet Assistive device: Rolling walker (2 wheeled) Gait Pattern/deviations: Step-through pattern;Trunk flexed     General Gait Details: verbal cues for back precautions, increased thoracic and cervical flexion however pt reports hx of this (likely due to spinal stenosis) ,encouraged upright posture as pt able to tolerate, distance limited by fatigue and pain today   Stairs            Wheelchair Mobility    Modified Rankin (Stroke Patients Only)       Balance Overall balance assessment: Needs assistance Sitting-balance support: No upper extremity supported;Feet  supported Sitting balance-Leahy Scale: Fair     Standing balance support: Bilateral upper extremity supported;During functional activity Standing balance-Leahy Scale: Fair                      Cognition Arousal/Alertness: Awake/alert Behavior During Therapy: WFL for tasks assessed/performed Overall Cognitive Status: Within Functional Limits for tasks assessed                      Exercises      General Comments        Pertinent Vitals/Pain Pain Assessment: 0-10 Pain Score: 7  Faces Pain Scale: Hurts little more Pain Location: back Pain Descriptors / Indicators: Sore Pain Intervention(s): Limited activity within patient's tolerance;Monitored during session;Repositioned    Home Living                      Prior Function            PT Goals (current goals can now be found in the care plan section) Progress towards PT goals: Progressing toward goals    Frequency  Min 5X/week    PT Plan Current plan remains appropriate    Co-evaluation             End of Session Equipment Utilized During Treatment: Gait belt Activity Tolerance: Patient tolerated treatment well Patient left: in chair;with call bell/phone within reach;with family/visitor present     Time: 4098-1191 PT Time Calculation (min) (ACUTE ONLY): 14 min  Charges:  $Gait Training: 8-22 mins                    G Codes:  LEMYRE,KATHrine E 07/31/2015, 12:45 PM Zenovia Jarred, PT, DPT 07/31/2015 Pager: [phone]

## 2015-07-31 NOTE — Progress Notes (Addendum)
[facility] CONSULT PROGRESS NOTE    Progress Note   Derek Bentley JWJ:[id number] DOB: 11/03/1935 DOA: 08-16-2015 PCP: Thayer Headings, MD   Brief Narrative:   Derek Bentley is an 79 y.o. male past medical history of spinal stenosis and admitted on 2015-08-16 for surgical intervention for this current problem. We're consulted for transient hypotension. We were reconsulted as Derek Bentley development nausea and vomiting and urinary retention.  Assessment/Plan:  Spinal stenosis of lumbar region Per surgery.  Transient hypotension: - Transient hypotension has resolved   Enlarged prostate with urinary retention: - I will insert a Foley, start him on Flomax daily. Reevaluate him with a voiding trial in 2-4 weeks. This probably due to side effects from anesthesia and BPH.  Nausea and vomiting and fever: - He denies any abdominal pain, his abdominal exam is benign. He hasn't had a bowel movement in 3-4 days she's been getting large amounts of narcotics which is probably contribute to his nausea and vomiting. - will go ahead and get an abdominal x-ray, start on MiraLAX twice a day if the abdominal x-ray show significant amounts to in the rectal vault we'll get a tap water enema. We'll schedule Zofran and change him to a clear liquid diet will reevaluate in the morning. - His  hemoglobin has remained stable he denies any abdominal pain and abdominal exam is benign. - Electrolytes are within normal limits, he denies any cough or shortness of breath, with just mild leukocytosis. - He has develop fever today, will repeat CXR start empiric antibitics, as his previous x-ray showed some ATX vs infiltrate. ? If he aspirated Check U/A, cdif and lower ext doppler to rule out DVT.     DVT Prophylaxis - Lovenox ordered.  Family Communication: none Disposition Plan: Per orthopedic surgery. Code Status:     Code Status Orders        Start     Ordered   08-16-15 1325  Full code   Continuous      08-16-2015 1324    Advance Directive Documentation        Most Recent Value   Type of Advance Directive  Healthcare Power of Attorney, Living will   Pre-existing out of facility DNR order (yellow form or pink MOST form)     "MOST" Form in Place?          IV Access:    Peripheral IV   Procedures and diagnostic studies:   Dg Chest Port 1 View  Aug 16, 2015   CLINICAL DATA:  Abnormally low blood pressure  EXAM: PORTABLE CHEST 1 VIEW  COMPARISON:  July 15, 2015  FINDINGS: The heart size and mediastinal contours are stable. There is bilateral increased pulmonary interstitium. There is no focal pneumonia or pleural effusion. The visualized skeletal structures are stable.  IMPRESSION: Diffuse increased pulmonary interstitium bilaterally. This is nonspecific but can be seen in interstitial edema or viral pneumonitis.   Electronically Signed   By: Sherian Rein M.D.   On: 08/16/2015 23:07   Dg Spine Portable 1 View  08/16/2015   CLINICAL DATA:  Derek Bentley for L3-5 lumbar surgery. Intraoperative localization film.  EXAM: PORTABLE SPINE - 1 VIEW  COMPARISON:  Intraoperative localization films earlier today.  FINDINGS: Clamp remains on the L4 spinous process. 3 probes are in place. The most superior projects over the L3-4 foramina. Second more inferior probe projects over the L4-5 foramina. A third probe is at the level of the superior endplate of L5.  IMPRESSION: Localization as above.  Electronically Signed   By: Drusilla Kanner M.D.   On: 07/29/2015 11:40   Dg Spine Portable 1 View  07/29/2015   CLINICAL DATA:  Derek Bentley for lumbar surgery L3-5. Intraoperative localization film.  EXAM: PORTABLE SPINE - 1 VIEW  COMPARISON:  Intraoperative localization film earlier today.  FINDINGS: Clamp is now in place along the L4 spinous process. Probe superior to the clamp is directed toward the L3-4 interspace and probe inferior to the spinous process is directed toward the L4-5 interspace.  IMPRESSION:  Localization as above.  Clamp is on the L4 spinous process.   Electronically Signed   By: Drusilla Kanner M.D.   On: 07/29/2015 10:53   Dg Spine Portable 1 View  07/29/2015   CLINICAL DATA:  Surgical level L3-4, L4-5.  EXAM: PORTABLE SPINE - 1 VIEW  COMPARISON:  07/23/2015  FINDINGS: Posterior surgical instruments are directed at the L3 and L5 spinous processes  IMPRESSION: Intraoperative localization as above.   Electronically Signed   By: Charlett Nose M.D.   On: 07/29/2015 10:36     Medical Consultants:      Anti-Infectives:   Anti-infectives    Start     Dose/Rate Route Frequency Ordered Stop   07/29/15 1600  ceFAZolin (ANCEF) IVPB 2 g/50 mL premix     2 g 100 mL/hr over 30 Minutes Intravenous Every 8 hours 07/29/15 1324 07/30/15 0914   07/29/15 1024  polymyxin B 500,000 Units, bacitracin 50,000 Units in sodium chloride irrigation 0.9 % 500 mL irrigation  Status:  Discontinued       As needed 07/29/15 1024 07/29/15 1203   07/29/15 0830  clindamycin (CLEOCIN) IVPB 900 mg     900 mg 100 mL/hr over 30 Minutes Intravenous On call to O.R. 07/29/15 0818 07/29/15 0952   07/29/15 0830  ceFAZolin (ANCEF) IVPB 2 g/50 mL premix     2 g 100 mL/hr over 30 Minutes Intravenous On call to O.R. 07/29/15 0818 07/29/15 0950      Subjective:    Derek Bentley denies abdominal pain but some nausea. He hasn't had a bowel movement in 2-4 days.  Objective:    Filed Vitals:   07/30/15 1515 07/30/15 1820 07/31/15 0112 07/31/15 0616  BP: 132/71 115/58 118/89 134/70  Pulse: 94 91 86 103  Temp: 98.2 F (36.8 C) 98.3 F (36.8 C) 98.6 F (37 C) 99.4 F (37.4 C)  TempSrc: Oral Oral Oral Oral  Resp: Height:      Weight:      SpO2: 98% 94% 95% 95%    Intake/Output Summary (Last 24 hours) at 07/31/15 0959 Last data filed at 07/31/15 0911  Gross per 24 hour  Intake    780 ml  Output   1025 ml  Net   -245 ml   Filed Weights   07/29/15 0913 07/29/15 1314  Weight: 58.832 kg  (129 lb 11.2 oz) 58.514 kg (129 lb)    Exam: Gen:  NAD Cardiovascular:  RRR, No M/R/G Respiratory:  Good air movement and clear to auscultation. Gastrointestinal:  Abdomen soft, NT/ND, + BS Extremities:  No C/E/C   Data Reviewed:    Labs: Basic Metabolic Panel:  Recent Labs Lab 07/29/15 2300 07/30/15 0530  NA 139 137  K 4.1 4.1  CL 106 105  CO2 28 27  GLUCOSE 108* 128*  BUN 8 8  CREATININE 0.84 0.87  CALCIUM 8.1* 8.3*   GFR Estimated Creatinine Clearance: 59.8 mL/min (by  C-G formula based on Cr of 0.87). Liver Function Tests: No results for input(s): AST, ALT, ALKPHOS, BILITOT, PROT, ALBUMIN in the last 168 hours. No results for input(s): LIPASE, AMYLASE in the last 168 hours. No results for input(s): AMMONIA in the last 168 hours. Coagulation profile No results for input(s): INR, PROTIME in the last 168 hours.  CBC:  Recent Labs Lab 07/29/15 2300 07/30/15 0530  WBC 8.6 9.6  HGB 11.8* 11.4*  HCT 34.8*  34.4* 34.2*  MCV 100.0 98.6  PLT 155 144*   Cardiac Enzymes: No results for input(s): CKTOTAL, CKMB, CKMBINDEX, TROPONINI in the last 168 hours. BNP (last 3 results) No results for input(s): PROBNP in the last 8760 hours. CBG: No results for input(s): GLUCAP in the last 168 hours. D-Dimer: No results for input(s): DDIMER in the last 72 hours. Hgb A1c: No results for input(s): HGBA1C in the last 72 hours. Lipid Profile: No results for input(s): CHOL, HDL, LDLCALC, TRIG, CHOLHDL, LDLDIRECT in the last 72 hours. Thyroid function studies: No results for input(s): TSH, T4TOTAL, T3FREE, THYROIDAB in the last 72 hours.  Invalid input(s): FREET3 Anemia work up: No results for input(s): VITAMINB12, FOLATE, FERRITIN, TIBC, IRON, RETICCTPCT in the last 72 hours. Sepsis Labs:  Recent Labs Lab 07/29/15 2300 07/30/15 0530  WBC 8.6 9.6  LATICACIDVEN 1.0  --    Microbiology Recent Results (from the past 240 hour(s))  Surgical pcr screen     Status:  Abnormal   Collection Time: 07/23/15 10:24 AM  Result Value Ref Range Status   MRSA, PCR NEGATIVE NEGATIVE Final   Staphylococcus aureus POSITIVE (A) NEGATIVE Final    Comment:        The Xpert SA Assay (FDA approved for NASAL specimens in patients over 19 years of age), is one component of a comprehensive surveillance program.  Test performance has been validated by [facility] for patients greater than or equal to 67 year old. It is not intended to diagnose infection nor to guide or monitor treatment.      Medications:   . acidophilus  1 capsule Oral Daily  . docusate sodium  100 mg Oral BID  . folic acid  1 mg Oral Daily  . pantoprazole  40 mg Oral Daily  . tamsulosin  0.4 mg Oral QPC supper  . vitamin B-12  100 mcg Oral Daily  . ascorbic acid  500 mg Oral Daily   Continuous Infusions: . sodium chloride      Time spent: 25 min   LOS: 2 days   Marinda Elk  [facility] Pager [phone]  *Please refer to [email or web address], password [email or web address] to get updated schedule on who will round on this Derek Bentley, as hospitalists switch teams weekly. If 7PM-7AM, please contact night-coverage at [email or web address], password [email or web address] for any overnight needs.  07/31/2015, 9:59 AM

## 2015-07-31 NOTE — Progress Notes (Addendum)
Subjective: 2 Days Post-Op Procedure(s) (LRB): DECOMPRESSION L4-L5 and L3-L4,  MICRODISCECTOMY L4-L5     (2 LEVELS) (N/A) Patient reports pain as moderate.  Reports soreness in the back and legs. He has been unable to void on his own and had to be straight cathed multiple times overnight. Medicine signed off yesterday as his hypotension had improved. He has hiccups this AM. Still very nauseous, overnight had some vomitus which did appear to be consistent with coffee grounds per nursing. This AM he has brought up some bile. Has been unable to eat due to N/V. No BM No flatus at this point. Last BM Tuesday. Reports some hx of constipation chronically as well.  Objective: Vital signs in last 24 hours: Temp:  [98.2 F (36.8 C)-99.4 F (37.4 C)] 99.4 F (37.4 C) (09/30 0616) Pulse Rate:  [86-103] 103 (09/30 0616) Resp:  [16-18] 16 (09/30 0616) BP: (115-134)/(58-89) 134/70 mmHg (09/30 0616) SpO2:  [94 %-98 %] 95 % (09/30 0616)  Intake/Output from previous day: 09/29 0701 - 09/30 0700 In: 1095 [P.O.:1020] Out: 975 [Urine:975] Intake/Output this shift:     Recent Labs  07/29/15 2300 07/30/15 0530  HGB 11.8* 11.4*    Recent Labs  07/29/15 2300 07/30/15 0530  WBC 8.6 9.6  RBC 3.48* 3.47*  HCT 34.8*  34.4* 34.2*  PLT 155 144*    Recent Labs  07/29/15 2300 07/30/15 0530  NA 139 137  K 4.1 4.1  CL 106 105  CO2 28 27  BUN 8 8  CREATININE 0.84 0.87  GLUCOSE 108* 128*  CALCIUM 8.1* 8.3*   No results for input(s): LABPT, INR in the last 72 hours.  Neurologically intact ABD soft Neurovascular intact Sensation intact distally Intact pulses distally Dorsiflexion/Plantar flexion intact Incision: dressing C/D/I and no drainage No cellulitis present Compartment soft no sign of DVT  Abdomen soft nondistended. Mild diffuse soreness on palpation  Assessment/Plan: 2 Days Post-Op Procedure(s) (LRB): DECOMPRESSION L4-L5 and L3-L4,  MICRODISCECTOMY L4-L5     (2 LEVELS)  (N/A) Advance diet Up with therapy D/C IV fluids  Bowel mgmt- dulcolax and mag citrate already ordered for prn use, continue colace Add warm kpad to abdomen Add phenergan to help manage N/V Back off on narcotics as much as possible to help decrease N/V did fairly well with Tylenol overnight Bladder scan/straight cath prn- will discuss urinary retention with Dr. Shelle Iron and plan to at least reconsult medicine if not also consult urology for their assistance May need to D/C with foley if retention not resolving  BISSELL, JACLYN M. 07/31/2015, 8:21 AM  Spoke with Dr. Shelle Iron UA and C&S ordered Discussed with Dr. Robb Matar who will re-eval patient today- Foley placed. Abd xray ordered, Zofran scheduled. Will re-eval tomorrow. Appreciate input Paged Dr. Annabell Howells to discuss consult vs. D/C with foley/abx and follow up outpt

## 2015-07-31 NOTE — Progress Notes (Signed)
Occupational Therapy Treatment Patient Details Name: ALIE HARDGROVE MRN: [id number] DOB: August 31, 1936 Today's Date: 07/31/2015    History of present illness 79 yo male s/p DECOMPRESSION L4-L5 and L3-L4, MICRODISCECTOMY L4-L5due to spinal stenosis   OT comments  Patient making progress towards OT goals, continue plan of care for now. Education regarding AE provided to pt and pt's wife. Encouraged pt to use seat and grab bars in walk-in shower for safety. Pt takes increased time to complete tasks.    Follow Up Recommendations  Home health OT;Supervision/Assistance - 24 hour    Equipment Recommendations  3 in 1 bedside comode;Other (comment) (AE - reacher, sock aid, LH sponge, LH shoe horn & grab bars in shower)    Recommendations for Other Services  None at this time   Precautions / Restrictions Precautions Precautions: Fall;Back Precaution Comments: Pt able to verbalize 3/3 back precautions, min cues needed for adhering to them during functional tasks/mobility Restrictions Weight Bearing Restrictions: No    Mobility Bed Mobility Overal bed mobility: Needs Assistance Bed Mobility: Sit to Supine Sit to supine: Mod assist   General bed mobility comments: Mod assist for management of BLEs. Cues for technique, sequencing to adhere to back precautions during sit>supine.   Transfers Overall transfer level: Needs assistance Equipment used: Rolling walker (2 wheeled) Transfers: Sit to/from Stand Sit to Stand: Min guard General transfer comment: verbal cues for precautions and hand placement    Balance Overall balance assessment: Needs assistance Sitting-balance support: No upper extremity supported;Feet supported Sitting balance-Leahy Scale: Fair     Standing balance support: Bilateral upper extremity supported;During functional activity Standing balance-Leahy Scale: Fair   ADL Overall ADL's : Needs assistance/impaired General ADL Comments: Introduced, demonstrated, and pt  return demonstrated use of  AE for LB ADLs (reacher, sock aid, LH sponge. LH shoe horn). Encouraged pt to purchase kit to increase independence with LB ADLs. Pt ambulated from recliner > BR for shower stall transfer. Encouraged use of shower seat for safety during showers. Also encouraged pt/wife purchase grab bars for safety in the shower.       Cognition   Behavior During Therapy: WFL for tasks assessed/performed Overall Cognitive Status: Within Functional Limits for tasks assessed (takes increased time to complete tasks)                 Pertinent Vitals/ Pain       Pain Assessment: Faces Faces Pain Scale: Hurts little more Pain Location: back Pain Descriptors / Indicators: Sore;Guarding Pain Intervention(s): Monitored during session   Frequency Min 2X/week     Progress Toward Goals  OT Goals(current goals can now befound in the care plan section)  Progress towards OT goals: Progressing toward goals     Plan Discharge plan remains appropriate    End of Session Equipment Utilized During Treatment: Rolling walker   Activity Tolerance Patient tolerated treatment well   Patient Left in bed;with call bell/phone within reach;with nursing/sitter in room    Time: 1583-0940 OT Time Calculation (min): 19 min  Charges: OT General Charges $OT Visit: 1 Procedure OT Treatments $Self Care/Home Management : 8-22 mins  CLAY,PATRICIA , MS, OTR/L, CLT Pager: [phone]  07/31/2015, 9:32 AM

## 2015-07-31 NOTE — Progress Notes (Signed)
ANTIBIOTIC CONSULT NOTE - INITIAL  Pharmacy Consult for vancomycin, adjust ceftazidime Indication: r/o PNA  No Known Allergies  Patient Measurements: Height:  (172.7 cm) Weight: 129 lb (58.514 kg) IBW/kg (Calculated) : 68.4  Vital Signs: Temp: 100.9 F (38.3 C) (09/30 1133) Temp Source: Oral (09/30 1133) BP: 144/66 mmHg (09/30 1133) Pulse Rate: 93 (09/30 1133) Intake/Output from previous day: 09/29 0701 - 09/30 0700 In: 1095 [P.O.:1020] Out: 975 [Urine:975] Intake/Output from this shift: Total I/O In: 0  Out: 400 [Urine:400]  Labs:  Recent Labs  07/29/15 2300 07/30/15 0530 07/31/15 1020  WBC 8.6 9.6 13.8*  HGB 11.8* 11.4* 12.7*  PLT 155 144* 152  CREATININE 0.84 0.87 0.78   Estimated Creatinine Clearance: 65 mL/min (by C-G formula based on Cr of 0.78). No results for input(s): VANCOTROUGH, VANCOPEAK, VANCORANDOM, GENTTROUGH, GENTPEAK, GENTRANDOM, TOBRATROUGH, TOBRAPEAK, TOBRARND, AMIKACINPEAK, AMIKACINTROU, AMIKACIN in the last 72 hours.   Microbiology: Recent Results (from the past 720 hour(s))  Surgical pcr screen     Status: Abnormal   Collection Time: 07/23/15 10:24 AM  Result Value Ref Range Status   MRSA, PCR NEGATIVE NEGATIVE Final   Staphylococcus aureus POSITIVE (A) NEGATIVE Final    Comment:        The Xpert SA Assay (FDA approved for NASAL specimens in patients over 60 years of age), is one component of a comprehensive surveillance program.  Test performance has been validated by [facility] for patients greater than or equal to 66 year old. It is not intended to diagnose infection nor to guide or monitor treatment.     Medical History: Past Medical History  Diagnosis Date  . Arthritis     Medications:  Anti-infectives    Start     Dose/Rate Route Frequency Ordered Stop   07/31/15 2200  vancomycin (VANCOCIN) IVPB 750 mg/150 ml premix     750 mg 150 mL/hr over 60 Minutes Intravenous Every 12 hours 07/31/15 1216     07/31/15  1400  cefTAZidime (FORTAZ) 2 g in dextrose 5 % 50 mL IVPB     2 g 100 mL/hr over 30 Minutes Intravenous 3 times per day 07/31/15 1211     07/31/15 1230  vancomycin (VANCOCIN) IVPB 1000 mg/200 mL premix     1,000 mg 200 mL/hr over 60 Minutes Intravenous  Once 07/31/15 1216     07/29/15 1600  ceFAZolin (ANCEF) IVPB 2 g/50 mL premix     2 g 100 mL/hr over 30 Minutes Intravenous Every 8 hours 07/29/15 1324 07/30/15 0914   07/29/15 1024  polymyxin B 500,000 Units, bacitracin 50,000 Units in sodium chloride irrigation 0.9 % 500 mL irrigation  Status:  Discontinued       As needed 07/29/15 1024 07/29/15 1203   07/29/15 0830  clindamycin (CLEOCIN) IVPB 900 mg     900 mg 100 mL/hr over 30 Minutes Intravenous On call to O.R. 07/29/15 0818 07/29/15 0952   07/29/15 0830  ceFAZolin (ANCEF) IVPB 2 g/50 mL premix     2 g 100 mL/hr over 30 Minutes Intravenous On call to O.R. 07/29/15 0818 07/29/15 0950     Assessment: 55 yoM w/ PMH spinal stenosis admitted 9/28 for spinal decompression.  Experienced hypotension overnight that has resolved; however, noted with new fever this AM and repeating CXR as previous showed ATX vs infiltrate.  Question possible aspiration.  Pharmacy to dose vanc and renally adjust ceftazidime for r/o PNA  9/30 >> vancomycin >> 9/30 >> ceftazidime >>  9/30 urine: IP S. pneumo UAg: IP Legionella UAg: IP  Today, 07/31/2015: Temp: 100.9, previously afebrile WBC: elevated today, previously wnl Renal: SCr stable wnl; CrCl 65 CG O2 sats wnl on RA   Goal of Therapy:  Vancomycin trough level 15-20 mcg/ml  Eradication of infection Appropriate antibiotic dosing for indication and renal function  Plan:  Day 1 antibiotics Vancomycin 1000 mg IV now, then 750 mg IV q12 hr Measure vancomycin trough levels at steady state as indicated Ceftazidime dosing is appropriate at 2 g IV q8 hr.  Follow clinical course, renal function, culture results as available  Follow for  de-escalation of antibiotics and LOT   Bernadene Person, PharmD, BCPS Pager: [phone] 07/31/2015, 12:33 PM

## 2015-08-01 ENCOUNTER — Ambulatory Visit (HOSPITAL_COMMUNITY): Payer: Medicare Other

## 2015-08-01 DIAGNOSIS — R509 Fever, unspecified: Secondary | ICD-10-CM

## 2015-08-01 DIAGNOSIS — M4806 Spinal stenosis, lumbar region: Secondary | ICD-10-CM | POA: Diagnosis not present

## 2015-08-01 DIAGNOSIS — G43A1 Cyclical vomiting, intractable: Secondary | ICD-10-CM | POA: Diagnosis not present

## 2015-08-01 DIAGNOSIS — I9789 Other postprocedural complications and disorders of the circulatory system, not elsewhere classified: Secondary | ICD-10-CM | POA: Diagnosis not present

## 2015-08-01 DIAGNOSIS — I4891 Unspecified atrial fibrillation: Secondary | ICD-10-CM

## 2015-08-01 DIAGNOSIS — R031 Nonspecific low blood-pressure reading: Secondary | ICD-10-CM | POA: Diagnosis not present

## 2015-08-01 LAB — BASIC METABOLIC PANEL
Anion gap: 8 (ref 5–15)
BUN: 11 mg/dL (ref 6–20)
CALCIUM: 8.2 mg/dL — AB (ref 8.9–10.3)
CHLORIDE: 107 mmol/L (ref 101–111)
CO2: 22 mmol/L (ref 22–32)
CREATININE: 0.9 mg/dL (ref 0.61–1.24)
Glucose, Bld: 80 mg/dL (ref 65–99)
Potassium: 3.9 mmol/L (ref 3.5–5.1)
SODIUM: 137 mmol/L (ref 135–145)

## 2015-08-01 LAB — LACTIC ACID, PLASMA
Lactic Acid, Venous: 1.4 mmol/L (ref 0.5–2.0)
Lactic Acid, Venous: 1.6 mmol/L (ref 0.5–2.0)

## 2015-08-01 LAB — HIV ANTIBODY (ROUTINE TESTING W REFLEX): HIV Screen 4th Generation wRfx: NONREACTIVE

## 2015-08-01 LAB — CBC
HEMATOCRIT: 35.5 % — AB (ref 39.0–52.0)
HEMOGLOBIN: 12 g/dL — AB (ref 13.0–17.0)
MCH: 33.2 pg (ref 26.0–34.0)
MCHC: 33.8 g/dL (ref 30.0–36.0)
MCV: 98.3 fL (ref 78.0–100.0)
Platelets: 155 10*3/uL (ref 150–400)
RBC: 3.61 MIL/uL — ABNORMAL LOW (ref 4.22–5.81)
RDW: 12.8 % (ref 11.5–15.5)
WBC: 14 10*3/uL — ABNORMAL HIGH (ref 4.0–10.5)

## 2015-08-01 LAB — EXPECTORATED SPUTUM ASSESSMENT W REFEX TO RESP CULTURE

## 2015-08-01 LAB — TROPONIN I
TROPONIN I: 0.04 ng/mL — AB (ref <0.031)
TROPONIN I: 0.03 ng/mL (ref <0.031)
Troponin I: 0.03 ng/mL (ref <0.031)

## 2015-08-01 LAB — URINE CULTURE: CULTURE: NO GROWTH

## 2015-08-01 LAB — APTT: APTT: 38 s — AB (ref 24–37)

## 2015-08-01 LAB — PROTIME-INR
INR: 1.24 (ref 0.00–1.49)
PROTHROMBIN TIME: 15.7 s — AB (ref 11.6–15.2)

## 2015-08-01 LAB — EXPECTORATED SPUTUM ASSESSMENT W GRAM STAIN, RFLX TO RESP C

## 2015-08-01 LAB — STREP PNEUMONIAE URINARY ANTIGEN: STREP PNEUMO URINARY ANTIGEN: NEGATIVE

## 2015-08-01 MED ORDER — HEPARIN (PORCINE) IN NACL 100-0.45 UNIT/ML-% IJ SOLN
800.0000 [IU]/h | INTRAMUSCULAR | Status: DC
  Filled 2015-08-01: qty 250

## 2015-08-01 MED ORDER — DILTIAZEM HCL 100 MG IV SOLR
5.0000 mg/h | INTRAVENOUS | Status: DC
  Filled 2015-08-01: qty 100

## 2015-08-01 MED ORDER — DILTIAZEM LOAD VIA INFUSION
15.0000 mg | Freq: Once | INTRAVENOUS | Status: DC
  Filled 2015-08-01: qty 15

## 2015-08-01 MED ORDER — DILTIAZEM HCL 30 MG PO TABS
30.0000 mg | ORAL_TABLET | Freq: Four times a day (QID) | ORAL | Status: DC
  Administered 2015-08-01: 30 mg via ORAL
  Filled 2015-08-01 (×4): qty 1

## 2015-08-01 NOTE — Progress Notes (Addendum)
When patient arrived to unit heart monitor was applied revealing NSR. BP 97/57. EKG performed confirming NSR. Cardiologist (turner) paged.  Derek Bentley. [name], RN

## 2015-08-01 NOTE — Progress Notes (Addendum)
[facility] CONSULT PROGRESS NOTE    Progress Note   Derek Bentley ZOX:[id number] DOB: 10-Sep-1936 DOA: 07/29/2015 PCP: Thayer Headings, MD   Brief Narrative:   Derek Bentley is an 79 y.o. male past medical history of spinal stenosis and admitted on 07/29/2015 for surgical intervention for this current problem. We're consulted for transient hypotension. We were reconsulted as patient development nausea and vomiting and urinary retention.  Assessment/Plan:  Spinal stenosis of lumbar region Per surgery.  Transient hypotension: - Transient hypotension has resolved   Enlarged prostate with urinary retention: - I will insert a Foley, start him on Flomax daily. Reevaluate him with a voiding trial in 2-4 weeks. This probably due to side effects from anesthesia and BPH.  Ileus: - I have personally reviewed shows patterns of intravenous, rectal wall does not show any stones. - Patient is currently not vomiting has not passed any gas or bowel movements. We will keep him nothing by mouth. - Decrease the use of narcotics, electrolyte normal limits.  Fever: - Started empirically on vancomycin and Fortaz due to his fever, repeated chest x-ray showed no infiltrates so empiric antibiotics were stopped - His  hemoglobin has remained stable, abdominal exam is benign. - Electrolytes are within normal limits, he denies any cough or shortness of breath, with just mild leukocytosis. - Check U/A, not compatible with a urinary tract infection, nausea DVT was negative for DVT. - No bowel movements C. difficile unlikely. - Surgery to check site of surgical incision. Has remained afebrile today.  New onset atrial fibrillation: Postsurgical, rigidity echo will start him on diltiazem 30 every 6 orally. Patient denies any chest pain or shortness of breath. Cycle cardiac biomarkers. To discuss with surgery when anticoagulation can be started. Will consult cardiology. Transfer to telemetry.  DVT  Prophylaxis - Lovenox ordered.  Family Communication: none Disposition Plan: Per orthopedic surgery. Code Status:     Code Status Orders        Start     Ordered   07/29/15 1325  Full code   Continuous     07/29/15 1324    Advance Directive Documentation        Most Recent Value   Type of Advance Directive  Healthcare Power of Attorney, Living will   Pre-existing out of facility DNR order (yellow form or pink MOST form)     "MOST" Form in Place?          IV Access:    Peripheral IV   Procedures and diagnostic studies:   Dg Abd 1 View  07/31/2015   CLINICAL DATA:  Nausea, vomiting today.  EXAM: ABDOMEN - 1 VIEW  COMPARISON:  None.  FINDINGS: Mild gaseous distention of bowel, predominantly colon. This may reflect mild ileus. No supine evidence of free air organomegaly. No evidence for obstruction. Advanced degenerative changes in the hips. Degenerative changes in the lumbar spine.  IMPRESSION: Mild gaseous distention of bowel in the upper abdomen, likely ileus.   Electronically Signed   By: Charlett Nose M.D.   On: 07/31/2015 10:50   Dg Chest Port 1 View  07/31/2015   CLINICAL DATA:  Productive cough, with production of dark brown mucus. Fever.  EXAM: PORTABLE CHEST 1 VIEW  COMPARISON:  07/29/2015  FINDINGS: No confluent airspace opacities or effusions. Heart is normal size. No acute bony abnormality. Degenerative changes in the shoulders.  IMPRESSION: No acute cardiopulmonary disease.   Electronically Signed   By: Charlett Nose M.D.   On: 07/31/2015  13:23     Medical Consultants:      Anti-Infectives:   Anti-infectives    Start     Dose/Rate Route Frequency Ordered Stop   07/31/15 2200  vancomycin (VANCOCIN) IVPB 750 mg/150 ml premix  Status:  Discontinued     750 mg 150 mL/hr over 60 Minutes Intravenous Every 12 hours 07/31/15 1216 07/31/15 1444   07/31/15 1400  cefTAZidime (FORTAZ) 2 g in dextrose 5 % 50 mL IVPB  Status:  Discontinued     2 g 100 mL/hr over 30  Minutes Intravenous 3 times per day 07/31/15 1211 07/31/15 1444   07/31/15 1230  vancomycin (VANCOCIN) IVPB 1000 mg/200 mL premix     1,000 mg 200 mL/hr over 60 Minutes Intravenous  Once 07/31/15 1216 07/31/15 1401   07/29/15 1600  ceFAZolin (ANCEF) IVPB 2 g/50 mL premix     2 g 100 mL/hr over 30 Minutes Intravenous Every 8 hours 07/29/15 1324 07/30/15 0914   07/29/15 1024  polymyxin B 500,000 Units, bacitracin 50,000 Units in sodium chloride irrigation 0.9 % 500 mL irrigation  Status:  Discontinued       As needed 07/29/15 1024 07/29/15 1203   07/29/15 0830  clindamycin (CLEOCIN) IVPB 900 mg     900 mg 100 mL/hr over 30 Minutes Intravenous On call to O.R. 07/29/15 0818 07/29/15 0952   07/29/15 0830  ceFAZolin (ANCEF) IVPB 2 g/50 mL premix     2 g 100 mL/hr over 30 Minutes Intravenous On call to O.R. 07/29/15 0818 07/29/15 0950      Subjective:    Derek Bentley he relates his nausea has resolved, his passing gas but has not had any bowel movements. Denies shortage of breath or chest pain.  Objective:    Filed Vitals:   07/31/15 2316 08/01/15 0020 08/01/15 0132 08/01/15 0657  BP: 92/43 98/49 150/126 88/52  Pulse: 80 70 79 113  Temp:   98.6 F (37 C) 98.1 F (36.7 C)  TempSrc:   Oral Oral  Resp:  Height:      Weight:      SpO2:  98% 93% 98%    Intake/Output Summary (Last 24 hours) at 08/01/15 0803 Last data filed at 08/01/15 0658  Gross per 24 hour  Intake 1847.5 ml  Output   2725 ml  Net -877.5 ml   Filed Weights   07/29/15 0913 07/29/15 1314  Weight: 58.832 kg (129 lb 11.2 oz) 58.514 kg (129 lb)    Exam: Gen:  NAD Cardiovascular:  RRR, No M/R/G Respiratory:  Good air movement and clear to auscultation. Gastrointestinal:  Abdomen soft, NT/ND, + BS Extremities:  No C/E/C   Data Reviewed:    Labs: Basic Metabolic Panel:  Recent Labs Lab 07/29/15 2300 07/30/15 0530 07/31/15 1020 08/01/15 0525  NA 139 137 128* 137  K 4.1 4.1 4.0 3.9  CL  106 105 96* 107  CO2 GLUCOSE 108* 128* 102* 80  BUN CREATININE 0.84 0.87 0.78 0.90  CALCIUM 8.1* 8.3* 8.5* 8.2*   GFR Estimated Creatinine Clearance: 57.8 mL/min (by C-G formula based on Cr of 0.9). Liver Function Tests:  Recent Labs Lab 07/31/15 1020  AST 28  ALT 11*  ALKPHOS 65  BILITOT 0.9  PROT 5.9*  ALBUMIN 3.3*   No results for input(s): LIPASE, AMYLASE in the last 168 hours. No results for input(s): AMMONIA in the last 168 hours. Coagulation  profile No results for input(s): INR, PROTIME in the last 168 hours.  CBC:  Recent Labs Lab 07/29/15 2300 07/30/15 0530 07/31/15 1020  WBC 8.6 9.6 13.8*  HGB 11.8* 11.4* 12.7*  HCT 34.8*  34.4* 34.2* 36.8*  MCV 100.0 98.6 97.4  PLT 155 144* 152   Cardiac Enzymes: No results for input(s): CKTOTAL, CKMB, CKMBINDEX, TROPONINI in the last 168 hours. BNP (last 3 results) No results for input(s): PROBNP in the last 8760 hours. CBG: No results for input(s): GLUCAP in the last 168 hours. D-Dimer: No results for input(s): DDIMER in the last 72 hours. Hgb A1c: No results for input(s): HGBA1C in the last 72 hours. Lipid Profile: No results for input(s): CHOL, HDL, LDLCALC, TRIG, CHOLHDL, LDLDIRECT in the last 72 hours. Thyroid function studies: No results for input(s): TSH, T4TOTAL, T3FREE, THYROIDAB in the last 72 hours.  Invalid input(s): FREET3 Anemia work up: No results for input(s): VITAMINB12, FOLATE, FERRITIN, TIBC, IRON, RETICCTPCT in the last 72 hours. Sepsis Labs:  Recent Labs Lab 07/29/15 2300 07/30/15 0530 07/31/15 1020  WBC 8.6 9.6 13.8*  LATICACIDVEN 1.0  --   --    Microbiology Recent Results (from the past 240 hour(s))  Surgical pcr screen     Status: Abnormal   Collection Time: 07/23/15 10:24 AM  Result Value Ref Range Status   MRSA, PCR NEGATIVE NEGATIVE Final   Staphylococcus aureus POSITIVE (A) NEGATIVE Final    Comment:        The Xpert SA Assay (FDA approved for  NASAL specimens in patients over 82 years of age), is one component of a comprehensive surveillance program.  Test performance has been validated by [facility] for patients greater than or equal to 8 year old. It is not intended to diagnose infection nor to guide or monitor treatment.   Urine culture     Status: None   Collection Time: 07/31/15 11:32 AM  Result Value Ref Range Status   Specimen Description URINE, CATHETERIZED  Final   Special Requests NONE  Final   Culture   Final    NO GROWTH 1 DAY Performed at [facility]    Report Status 08/01/2015 FINAL  Final  Culture, sputum-assessment     Status: None   Collection Time: 08/01/15  1:44 AM  Result Value Ref Range Status   Specimen Description SPUTUM  Final   Special Requests NONE  Final   Sputum evaluation   Final    MICROSCOPIC FINDINGS SUGGEST THAT THIS SPECIMEN IS NOT REPRESENTATIVE OF LOWER RESPIRATORY SECRETIONS. PLEASE RECOLLECT. CALLED TO PURDY,J/6E  ON 08/01/15 BY KARCZEWSKI,S.    Report Status 08/01/2015 FINAL  Final     Medications:   . acidophilus  1 capsule Oral Daily  . docusate sodium  100 mg Oral BID  . folic acid  1 mg Oral Daily  . ondansetron (ZOFRAN) IV  4 mg Intravenous 4 times per day  . pantoprazole  40 mg Oral Daily  . polyethylene glycol  17 g Oral Daily  . saccharomyces boulardii  250 mg Oral BID  . tamsulosin  0.4 mg Oral QPC supper  . vitamin B-12  100 mcg Oral Daily  . ascorbic acid  500 mg Oral Daily   Continuous Infusions: . sodium chloride 75 mL/hr at 07/31/15 2316    Time spent: 25 min   LOS: 3 days   Marinda Elk  [facility] Pager [phone]  *Please refer to [email or web address], password [email or web address] to get updated schedule  on who will round on this patient, as hospitalists switch teams weekly. If 7PM-7AM, please contact night-coverage at [email or web address], password [email or web address] for any overnight needs.  08/01/2015, 8:03 AM

## 2015-08-01 NOTE — Progress Notes (Signed)
OT Cancellation Note  Patient Details Name: Derek Bentley MRN: [id number] DOB: 1935-12-31   Cancelled Treatment:    Reason Eval/Treat Not Completed: Medical issues which prohibited therapy   Medical issues which prohibited therapy (moved to tele with onset afib. in NSR but RN requesting to hold OT today) [name], [place] 08/01/2015, 2:35 PM

## 2015-08-01 NOTE — Consult Note (Addendum)
Admit date: 07/29/2015 Referring Physician  Dr. Shelle Iron Primary Physician  Dr. Thea Silversmith Primary Cardiologist  None Reason for Consultation  Atrial fibrillation with RVR  HPI: This is a 79yo WM with no prior cardiac history who was admitted with spinal stenosis and underwent L4-L5 decompression on 9/29.  Post op he had hypotension to 89/45 but he was asymptomatic.  His BP usually runs 100/50's.  He was given an IVF bolus with resolution of hypotension.  He was mildly bradycardic at 56bpm.  Chest xray showed possible interstitial edema.  He develoepd nausea, vomiting and urinary retention and a foley was placed and he was started on Flomax.  He developed a fever and Septic workup in progress and started on [place].  Repeat chest xray showed no infiltrates and antbx were stopped.  This am noted to be in atrial fibrillation with RVR which he has never had before. Cardiology is now asked to consult.  He denies any chest pain, SOB, DOE, dizziness or palpitations.       PMH:   Past Medical History  Diagnosis Date  . Arthritis      PSH:   Past Surgical History  Procedure Laterality Date  . Hernia repair      left and right   . Left hip fracture surgery     . Tonsillectomy    . Circumcision    . Lumbar laminectomy/decompression microdiscectomy N/A 07/29/2015    Procedure: DECOMPRESSION L4-L5 and L3-L4,  MICRODISCECTOMY L4-L5     (2 LEVELS);  Surgeon: Jene Every, MD;  Location: [facility];  Service: Orthopedics;  Laterality: N/A;    Allergies:  Review of patient's allergies indicates no known allergies. Prior to Admit Meds:   Prescriptions prior to admission  Medication Sig Dispense Refill Last Dose  . acetaminophen (TYLENOL) 500 MG tablet Take 500 mg by mouth every 6 (six) hours as needed for mild pain or headache.   07/22/2015  . ascorbic acid (VITAMIN C) 500 MG tablet Take 500 mg by mouth daily.   07/22/2015  . calcium-vitamin D (OSCAL WITH D) 500-200 MG-UNIT per tablet Take 1 tablet  by mouth daily with breakfast.   07/22/2015  . Cyanocobalamin (VITAMIN B 12 PO) Take 1 tablet by mouth daily.   07/22/2015  . FOLIC ACID PO Take 1 tablet by mouth daily.   07/22/2015  . MAGNESIUM PO Take 1 tablet by mouth daily.   07/22/2015  . Multiple Vitamin (MULTIVITAMIN WITH MINERALS) TABS tablet Take 1 tablet by mouth daily.   07/22/2015  . TURMERIC PO Take 1 tablet by mouth daily.   07/22/2015   Fam HX:    Family History  Problem Relation Age of Onset  . Hypertension Other    Social HX:    Social History   Social History  . Marital Status: Married    Spouse Name: N/A  . Number of Children: N/A  . Years of Education: N/A   Occupational History  . Not on file.   Social History Main Topics  . Smoking status: Never Smoker   . Smokeless tobacco: Not on file  . Alcohol Use: No  . Drug Use: No  . Sexual Activity: Not on file   Other Topics Concern  . Not on file   Social History Narrative     ROS:  All 11 ROS were addressed and are negative except what is stated in the HPI  Physical Exam: Blood pressure 88/52, pulse 113, temperature 98.1 F (36.7 C),  temperature source Oral, resp. rate 20, height  (1.727 m), weight 129 lb (58.514 kg), SpO2 98 %.    General: Well developed, well nourished, in no acute distress Head: Eyes PERRLA, No xanthomas.   Normal cephalic and atramatic  Lungs:   Clear bilaterally to auscultation and percussion. Heart:   Irregularly irregular and tachy S1 S2 Pulses are 2+ & equal.            No carotid bruit. No JVD.  No abdominal bruits. No femoral bruits. Abdomen: Bowel sounds are positive, abdomen soft and non-tender without masses  Extremities:   No clubbing, cyanosis or edema.  DP +1 Neuro: Alert and oriented X 3. Psych:  Good affect, responds appropriately    Labs:   Lab Results  Component Value Date   WBC 14.0* 08/01/2015   HGB 12.0* 08/01/2015   HCT 35.5* 08/01/2015   MCV 98.3 08/01/2015   PLT 155 08/01/2015    Recent  Labs Lab 07/31/15 1020 08/01/15 0525  NA 128* 137  K 4.0 3.9  CL 96* 107  CO2 25 22  BUN 15 11  CREATININE 0.78 0.90  CALCIUM 8.5* 8.2*  PROT 5.9*  --   BILITOT 0.9  --   ALKPHOS 65  --   ALT 11*  --   AST 28  --   GLUCOSE 102* 80   No results found for: PTT No results found for: INR, PROTIME No results found for: CKTOTAL, CKMB, CKMBINDEX, TROPONINI  No results found for: CHOL No results found for: HDL No results found for: LDLCALC No results found for: TRIG No results found for: CHOLHDL No results found for: LDLDIRECT    Radiology:  Dg Abd 1 View  07/31/2015   CLINICAL DATA:  Nausea, vomiting today.  EXAM: ABDOMEN - 1 VIEW  COMPARISON:  None.  FINDINGS: Mild gaseous distention of bowel, predominantly colon. This may reflect mild ileus. No supine evidence of free air organomegaly. No evidence for obstruction. Advanced degenerative changes in the hips. Degenerative changes in the lumbar spine.  IMPRESSION: Mild gaseous distention of bowel in the upper abdomen, likely ileus.   Electronically Signed   By: Charlett Nose M.D.   On: 07/31/2015 10:50   Dg Chest Port 1 View  07/31/2015   CLINICAL DATA:  Productive cough, with production of dark brown mucus. Fever.  EXAM: PORTABLE CHEST 1 VIEW  COMPARISON:  07/29/2015  FINDINGS: No confluent airspace opacities or effusions. Heart is normal size. No acute bony abnormality. Degenerative changes in the shoulders.  IMPRESSION: No acute cardiopulmonary disease.   Electronically Signed   By: Charlett Nose M.D.   On: 07/31/2015 13:23    EKG:  Atrial fibrillation with RVR and IRBBB and inferior infarct  ASSESSMENT/PLAN: 1.  New onset atrial fibrillation with RVR:  This is most likely due to high catecholamine state post op.  He was started on PO Cardizem but HR still elevated.  Will d/c PO and start IV Cardizem gtt and titrate for HR as BP tolerates.  Check TSH.  Keep K>4.  Check 2D echo once HR slows to assess LV function and LA size. Transfer to  tele bed. His CHADS2VASC score is 2(based only on age > 36).  If OK with surgery would start IV Heparin gtt.  If he converts spontaneously to NSR would probably not anticoagulate long term since this is in setting of post op state.  Would consider outpt event monitor to assess for silent afib.  2.  Inferior infarct on EKG - check wall motion on EKG   Quintella Reichert, MD  08/01/2015  9:34 AM

## 2015-08-01 NOTE — Progress Notes (Addendum)
   Subjective: 3 Days Post-Op Procedure(s) (LRB): DECOMPRESSION L4-L5 and L3-L4,  MICRODISCECTOMY L4-L5     (2 LEVELS) (N/A) Patient reports pain as mild.   Patient seen in rounds for Dr. Shelle Iron Patient is well, and has had no acute complaints or problems from ortho perspective. Reports that he has hiccups. Some nausea this morning but much better since yesterday. No vomiting since yesterday. Foley continued.    Objective: Vital signs in last 24 hours: Temp:  [98.1 F (36.7 C)-100.9 F (38.3 C)] 98.1 F (36.7 C) (10/01 0657) Pulse Rate:  [70-117] 113 (10/01 0657) Resp:  [14-20] 20 (10/01 0657) BP: (85-150)/(43-126) 88/52 mmHg (10/01 0657) SpO2:  [93 %-99 %] 98 % (10/01 0657)  Intake/Output from previous day:  Intake/Output Summary (Last 24 hours) at 08/01/15 0934 Last data filed at 08/01/15 0810  Gross per 24 hour  Intake 1847.5 ml  Output   2675 ml  Net -827.5 ml     Labs:  Recent Labs  07/29/15 2300 07/30/15 0530 07/31/15 1020 08/01/15 0835  HGB 11.8* 11.4* 12.7* 12.0*    Recent Labs  07/31/15 1020 08/01/15 0835  WBC 13.8* 14.0*  RBC 3.78* 3.61*  HCT 36.8* 35.5*  PLT 152 155    Recent Labs  07/31/15 1020 08/01/15 0525  NA 128* 137  K 4.0 3.9  CL 96* 107  CO2 25 22  BUN 15 11  CREATININE 0.78 0.90  GLUCOSE 102* 80  CALCIUM 8.5* 8.2*    EXAM General - Patient is Alert and Oriented Extremity - Neurologically intact Intact pulses distally Dorsiflexion/Plantar flexion intact Compartment soft Dressing/Incision -mild frank blood drainage Motor Function - intact, moving foot and toes well on exam.   Past Medical History  Diagnosis Date  . Arthritis     Assessment/Plan: 3 Days Post-Op Procedure(s) (LRB): DECOMPRESSION L4-L5 and L3-L4,  MICRODISCECTOMY L4-L5     (2 LEVELS) (N/A) Principal Problem:   Spinal stenosis of lumbar region Active Problems:   Transient hypotension   Abnormal chest x-ray   Enlarged prostate with urinary retention  Nausea and vomiting  Estimated body mass index is 19.62 kg/(m^2) as calculated from the following:   Height as of this encounter:  (1.727 m).   Weight as of this encounter: 58.514 kg (129 lb).   WBAT  Stable from ortho perspective but is continuing to have multiple medical issues post op. He has developed new onset Afib. Medicine plans for transfer to telemetry and to consult cardiology. Continue diet per medicine. Discussed case with cardiology and patient cleared to start heparin as he is 50 post op. Will monitor any incisional changes.  Dimitri Ped, PA-C Orthopaedic Surgery 08/01/2015, 9:34 AM

## 2015-08-01 NOTE — Progress Notes (Signed)
PT Cancellation Note  Patient Details Name: Derek Bentley MRN: [id number] DOB: June 06, 1936   Cancelled Treatment:    Reason Eval/Treat Not Completed: Medical issues which prohibited therapy (moved to tele with onset afib.  in NSR but RN requesting to hold PT today.)   Rada Hay 08/01/2015, 2:33 PM

## 2015-08-01 NOTE — Progress Notes (Signed)
I talked to Dr. Mayford Knife, pt has converted to SR rate of 80,  BP systolic at 97.  Will stop IV dilt and No heparin for now.  Once BP more stable would add po dilt 30 mg every 8 hours.

## 2015-08-01 NOTE — Progress Notes (Signed)
Patient ambulated approximately 69ft with Nurse and NT wearing gait belt using front wheel walker. Patient required moderate assistance and tolerated actively fairly. Became weak and complained of "dizzyness". Heart rate stable at 80-90 while ambulating. Patient then sat in chair and stated he was comfortable.  Earnest Conroy. [name], RN

## 2015-08-01 NOTE — Progress Notes (Signed)
ANTICOAGULATION CONSULT NOTE - Initial Consult  Pharmacy Consult for Heparin Indication: atrial fibrillation  No Known Allergies  Patient Measurements: Height:  (172.7 cm) Weight: 129 lb (58.514 kg) IBW/kg (Calculated) : 68.4 Heparin Dosing Weight: using actual body weight  Vital Signs: Temp: 98.1 F (36.7 C) (10/01 0657) Temp Source: Oral (10/01 0657) BP: 88/52 mmHg (10/01 0657) Pulse Rate: 113 (10/01 0657)  Labs:  Recent Labs  07/30/15 0530 07/31/15 1020 08/01/15 0525 08/01/15 0835  HGB 11.4* 12.7*  --  12.0*  HCT 34.2* 36.8*  --  35.5*  PLT 144* 152  --  155  CREATININE 0.87 0.78 0.90  --     Estimated Creatinine Clearance: 57.8 mL/min (by C-G formula based on Cr of 0.9).   Medical History: Past Medical History  Diagnosis Date  . Arthritis      Assessment: 54 yoM with no prior cardiac history admitted with spinal stenosis, now s/p L4-L5 decompression on 9/29.  Post-op experienced hypotension, N/V, urinary retention, fever and was started on antibiotics for septic workup that were quickly discontinued after infection ruled out.  Now, in new onset atrial fibrillation with RVR.  Cards consulted and recommending starting heparin infusion per pharmacy without a bolus per ortho recommendation.  Hgb 12.0, platelets WNL CrCl~57 ml/min  Goal of Therapy:  Heparin level 0.3-0.7 units/ml Monitor platelets by anticoagulation protocol: Yes   Plan:  1.  Order baseline anticoagulation labs. 2.  Start heparin infusion at 800 units/hr. 3.  Check heparin level 8 hours after start of infusion. 4.  Daily heparin level and CBC.  Clance Boll 08/01/2015,9:52 AM

## 2015-08-02 ENCOUNTER — Ambulatory Visit (HOSPITAL_COMMUNITY): Payer: Medicare Other

## 2015-08-02 DIAGNOSIS — I4891 Unspecified atrial fibrillation: Secondary | ICD-10-CM | POA: Diagnosis not present

## 2015-08-02 DIAGNOSIS — M4806 Spinal stenosis, lumbar region: Secondary | ICD-10-CM | POA: Diagnosis not present

## 2015-08-02 DIAGNOSIS — I9789 Other postprocedural complications and disorders of the circulatory system, not elsewhere classified: Secondary | ICD-10-CM

## 2015-08-02 DIAGNOSIS — N400 Enlarged prostate without lower urinary tract symptoms: Secondary | ICD-10-CM | POA: Diagnosis not present

## 2015-08-02 DIAGNOSIS — K567 Ileus, unspecified: Secondary | ICD-10-CM | POA: Diagnosis not present

## 2015-08-02 DIAGNOSIS — R031 Nonspecific low blood-pressure reading: Secondary | ICD-10-CM

## 2015-08-02 DIAGNOSIS — R509 Fever, unspecified: Secondary | ICD-10-CM | POA: Diagnosis not present

## 2015-08-02 LAB — BASIC METABOLIC PANEL
Anion gap: 5 (ref 5–15)
BUN: 8 mg/dL (ref 6–20)
CO2: 26 mmol/L (ref 22–32)
CREATININE: 0.8 mg/dL (ref 0.61–1.24)
Calcium: 8.1 mg/dL — ABNORMAL LOW (ref 8.9–10.3)
Chloride: 108 mmol/L (ref 101–111)
GFR calc Af Amer: >60 mL/min (ref >60)
Glucose, Bld: 86 mg/dL (ref 65–99)
POTASSIUM: 3.4 mmol/L — AB (ref 3.5–5.1)
SODIUM: 139 mmol/L (ref 135–145)

## 2015-08-02 LAB — EXPECTORATED SPUTUM ASSESSMENT W GRAM STAIN, RFLX TO RESP C

## 2015-08-02 LAB — EXPECTORATED SPUTUM ASSESSMENT W REFEX TO RESP CULTURE

## 2015-08-02 MED ORDER — POTASSIUM CHLORIDE CRYS ER 20 MEQ PO TBCR
40.0000 meq | EXTENDED_RELEASE_TABLET | Freq: Two times a day (BID) | ORAL | Status: AC
  Administered 2015-08-02 (×2): 40 meq via ORAL
  Filled 2015-08-02 (×2): qty 2

## 2015-08-02 MED ORDER — ONDANSETRON HCL 4 MG/2ML IJ SOLN: 4.0000 mg | Freq: Four times a day (QID) | INTRAMUSCULAR | Status: DC | PRN

## 2015-08-02 MED ORDER — POTASSIUM CHLORIDE 2 MEQ/ML IV SOLN
INTRAVENOUS | Status: DC
  Administered 2015-08-02 – 2015-08-03 (×2): via INTRAVENOUS
  Filled 2015-08-02 (×3): qty 1000

## 2015-08-02 NOTE — Progress Notes (Signed)
  Echocardiogram 2D Echocardiogram has been performed.  Derek Bentley 08/02/2015, 12:38 PM

## 2015-08-02 NOTE — Progress Notes (Signed)
SUBJECTIVE:  No complaints  OBJECTIVE:   Vitals:   Filed Vitals:   08/01/15 1049 08/01/15 1449 08/01/15 2016 08/02/15 0448  BP: 97/57 108/50 114/51 109/49  Pulse: 99 104 93 84  Temp: 98.1 F (36.7 C) 98.3 F (36.8 C) 98.6 F (37 C) 98.3 F (36.8 C)  TempSrc: Oral Oral Oral Oral  Resp: Height:      Weight:      SpO2: 98%  95% 94%   I&O's:   Intake/Output Summary (Last 24 hours) at 08/02/15 0753 Last data filed at 08/02/15 0750  Gross per 24 hour  Intake    840 ml  Output    679 ml  Net    161 ml   TELEMETRY: Reviewed telemetry pt in NSR:     PHYSICAL EXAM General: Well developed, well nourished, in no acute distress Head: Eyes PERRLA, No xanthomas.   Normal cephalic and atramatic  Lungs:   Clear bilaterally to auscultation and percussion. Heart:   HRRR S1 S2 Pulses are 2+ & equal. Abdomen: Bowel sounds are positive, abdomen soft and non-tender without masses Extremities:   No clubbing, cyanosis or edema.  DP +1 Neuro: Alert and oriented X 3. Psych:  Good affect, responds appropriately   LABS: Basic Metabolic Panel:  Recent Labs  16/10/96 0525 08/02/15 0515  NA 137 139  K 3.9 3.4*  CL 107 108  CO2 22 26  GLUCOSE 80 86  BUN 11 8  CREATININE 0.90 0.80  CALCIUM 8.2* 8.1*   Liver Function Tests:  Recent Labs  07/31/15 1020  AST 28  ALT 11*  ALKPHOS 65  BILITOT 0.9  PROT 5.9*  ALBUMIN 3.3*   No results for input(s): LIPASE, AMYLASE in the last 72 hours. CBC:  Recent Labs  07/31/15 1020 08/01/15 0835  WBC 13.8* 14.0*  HGB 12.7* 12.0*  HCT 36.8* 35.5*  MCV 97.4 98.3  PLT 152 155   Cardiac Enzymes:  Recent Labs  08/01/15 0835 08/01/15 1434 08/01/15 2048  TROPONINI 0.04* 0.03 0.03   BNP: Invalid input(s): POCBNP D-Dimer: No results for input(s): DDIMER in the last 72 hours. Hemoglobin A1C: No results for input(s): HGBA1C in the last 72 hours. Fasting Lipid Panel: No results for input(s): CHOL, HDL, LDLCALC,  TRIG, CHOLHDL, LDLDIRECT in the last 72 hours. Thyroid Function Tests: No results for input(s): TSH, T4TOTAL, T3FREE, THYROIDAB in the last 72 hours.  Invalid input(s): FREET3 Anemia Panel: No results for input(s): VITAMINB12, FOLATE, FERRITIN, TIBC, IRON, RETICCTPCT in the last 72 hours. Coag Panel:   Lab Results  Component Value Date   INR 1.24 08/01/2015    RADIOLOGY: Dg Lumbar Spine 2-3 Views  07/23/2015   CLINICAL DATA:  Lumbar spine surgery.  EXAM: LUMBAR SPINE - 2-3 VIEW  COMPARISON:  06/03/2015.  FINDINGS: Paraspinal soft tissues normal. Diffuse severe degenerative changes noted of the lumbar spine. Stable T12 compression fracture. Stable mild scoliosis lumbar spine concave left.  IMPRESSION: Diffuse degenerative changes scoliosis lumbar spine. Stable mild T12 compression fracture. No new changes. No acute abnormality .   Electronically Signed   By: Maisie Fus  Register   On: 07/23/2015 13:59   Dg Abd 1 View  08/01/2015   CLINICAL DATA:  Ileus. Lumbar spine surgery 3 days prior. No bowel movement 4 days.  EXAM: ABDOMEN - 1 VIEW  COMPARISON:  Abdominal radiograph from one day prior.  FINDINGS: There are no dilated small bowel loops. There is diffuse mild to moderate  gaseous distention of the large bowel, which is worsened in the interval. No evidence of pneumatosis or pneumoperitoneum. Skin staples overlie the lower lumbar spine. Screws are partially visualized in the left femoral neck. Marked degenerative changes in the visualized thoracolumbar spine. Severe bilateral hip osteoarthritis. Laminectomy changes at L4.  IMPRESSION: Worsening mild to moderate gaseous distention of the large bowel without small bowel dilatation, in keeping with worsening mild to moderate large bowel adynamic ileus.   Electronically Signed   By: Delbert Phenix M.D.   On: 08/01/2015 16:21   Dg Abd 1 View  07/31/2015   CLINICAL DATA:  Nausea, vomiting today.  EXAM: ABDOMEN - 1 VIEW  COMPARISON:  None.  FINDINGS: Mild  gaseous distention of bowel, predominantly colon. This may reflect mild ileus. No supine evidence of free air organomegaly. No evidence for obstruction. Advanced degenerative changes in the hips. Degenerative changes in the lumbar spine.  IMPRESSION: Mild gaseous distention of bowel in the upper abdomen, likely ileus.   Electronically Signed   By: Charlett Nose M.D.   On: 07/31/2015 10:50   Dg Chest Port 1 View  07/31/2015   CLINICAL DATA:  Productive cough, with production of dark brown mucus. Fever.  EXAM: PORTABLE CHEST 1 VIEW  COMPARISON:  07/29/2015  FINDINGS: No confluent airspace opacities or effusions. Heart is normal size. No acute bony abnormality. Degenerative changes in the shoulders.  IMPRESSION: No acute cardiopulmonary disease.   Electronically Signed   By: Charlett Nose M.D.   On: 07/31/2015 13:23   Dg Chest Port 1 View  07/29/2015   CLINICAL DATA:  Abnormally low blood pressure  EXAM: PORTABLE CHEST 1 VIEW  COMPARISON:  July 15, 2015  FINDINGS: The heart size and mediastinal contours are stable. There is bilateral increased pulmonary interstitium. There is no focal pneumonia or pleural effusion. The visualized skeletal structures are stable.  IMPRESSION: Diffuse increased pulmonary interstitium bilaterally. This is nonspecific but can be seen in interstitial edema or viral pneumonitis.   Electronically Signed   By: Sherian Rein M.D.   On: 07/29/2015 23:07   Dg Spine Portable 1 View  07/29/2015   CLINICAL DATA:  Patient for L3-5 lumbar surgery. Intraoperative localization film.  EXAM: PORTABLE SPINE - 1 VIEW  COMPARISON:  Intraoperative localization films earlier today.  FINDINGS: Clamp remains on the L4 spinous process. 3 probes are in place. The most superior projects over the L3-4 foramina. Second more inferior probe projects over the L4-5 foramina. A third probe is at the level of the superior endplate of L5.  IMPRESSION: Localization as above.   Electronically Signed   By: Drusilla Kanner M.D.   On: 07/29/2015 11:40   Dg Spine Portable 1 View  07/29/2015   CLINICAL DATA:  Patient for lumbar surgery L3-5. Intraoperative localization film.  EXAM: PORTABLE SPINE - 1 VIEW  COMPARISON:  Intraoperative localization film earlier today.  FINDINGS: Clamp is now in place along the L4 spinous process. Probe superior to the clamp is directed toward the L3-4 interspace and probe inferior to the spinous process is directed toward the L4-5 interspace.  IMPRESSION: Localization as above.  Clamp is on the L4 spinous process.   Electronically Signed   By: Drusilla Kanner M.D.   On: 07/29/2015 10:53   Dg Spine Portable 1 View  07/29/2015   CLINICAL DATA:  Surgical level L3-4, L4-5.  EXAM: PORTABLE SPINE - 1 VIEW  COMPARISON:  07/23/2015  FINDINGS: Posterior surgical instruments are directed at  the L3 and L5 spinous processes  IMPRESSION: Intraoperative localization as above.   Electronically Signed   By: Charlett Nose M.D.   On: 07/29/2015 10:36    ASSESSMENT/PLAN: 1. New onset atrial fibrillation with RVR now converted to NSR: This is most likely due to high catecholamine state post op.BP too soft for BB or CCB. Check 2D echo to assess LV function and LA size.  His CHADS2VASC score is 2(based only on age > 83).Would probably not anticoagulate long term since this is in setting of post op state. Would consider outpt event monitor to assess for silent afib.  2. Inferior infarct on EKG - check wall motion on echo    Quintella Reichert, MD  08/02/2015  7:53 AM

## 2015-08-02 NOTE — Progress Notes (Signed)
[facility] CONSULT PROGRESS NOTE    Progress Note   Derek Bentley ZOX:[id number] DOB: [birth year] DOA: 07/29/2015 PCP: Thayer Headings, MD   Brief Narrative:   Derek Bentley is an 79 y.o. male past medical history of spinal stenosis and admitted on 07/29/2015 for surgical intervention for this current problem. We're consulted for transient hypotension. We were reconsulted as patient development nausea and vomiting and urinary retention.  Assessment/Plan:  Spinal stenosis of lumbar region Per surgery.  Transient hypotension: - Transient hypotension has resolved   Enlarged prostate with urinary retention: - I will insert a Foley, start him on Flomax daily. Reevaluate him with a voiding trial in 2-4 weeks. This probably due to side effects from anesthesia and BPH.  Ileus: - I have personally reviewed shows patterns of ileus,rectal wall does not show any stones. - Tolerating diet - Decrease the use of narcotics, replete Potassium. I agree with Reglan has had several BM of night. - from and internal medicine stand he is close to D/c awaiting for cards rec.  Fever: - Started empirically on vancomycin and Fortaz due to his fever, repeated chest x-ray showed no infiltrates so empiric antibiotics were stopped, has remained afebrile for 48 hrs, off antibiotics. - His  hemoglobin has remained stable, abdominal exam is benign. - Electrolytes are within normal limits, he denies any cough or shortness of breath, with just mild leukocytosis. - Check U/A, not compatible with a urinary tract infection, nausea DVT was negative for DVT. - C. difficile unlikely. - Surgery to check site of surgical incision. Has remained afebrile today.  New onset atrial fibrillation: Postsurgical, now in SR. Echo pending Patient denies any chest pain or shortness of breath. Cycle cardiac biomarker's negative x3. Appreciate cardiology assistance.  DVT Prophylaxis - Lovenox ordered.  Family  Communication: none Disposition Plan: Per orthopedic surgery. Code Status:     Code Status Orders        Start     Ordered   07/29/15 1325  Full code   Continuous     07/29/15 1324    Advance Directive Documentation        Most Recent Value   Type of Advance Directive  Healthcare Power of Attorney, Living will   Pre-existing out of facility DNR order (yellow form or pink MOST form)     "MOST" Form in Place?          IV Access:    Peripheral IV   Procedures and diagnostic studies:   Dg Abd 1 View  08/01/2015   CLINICAL DATA:  Ileus. Lumbar spine surgery 3 days prior. No bowel movement 4 days.  EXAM: ABDOMEN - 1 VIEW  COMPARISON:  Abdominal radiograph from one day prior.  FINDINGS: There are no dilated small bowel loops. There is diffuse mild to moderate gaseous distention of the large bowel, which is worsened in the interval. No evidence of pneumatosis or pneumoperitoneum. Skin staples overlie the lower lumbar spine. Screws are partially visualized in the left femoral neck. Marked degenerative changes in the visualized thoracolumbar spine. Severe bilateral hip osteoarthritis. Laminectomy changes at L4.  IMPRESSION: Worsening mild to moderate gaseous distention of the large bowel without small bowel dilatation, in keeping with worsening mild to moderate large bowel adynamic ileus.   Electronically Signed   By: Delbert Phenix M.D.   On: 08/01/2015 16:21   Dg Abd 1 View  07/31/2015   CLINICAL DATA:  Nausea, vomiting today.  EXAM: ABDOMEN - 1 VIEW  COMPARISON:  None.  FINDINGS: Mild gaseous distention of bowel, predominantly colon. This may reflect mild ileus. No supine evidence of free air organomegaly. No evidence for obstruction. Advanced degenerative changes in the hips. Degenerative changes in the lumbar spine.  IMPRESSION: Mild gaseous distention of bowel in the upper abdomen, likely ileus.   Electronically Signed   By: Charlett Nose M.D.   On: 07/31/2015 10:50   Dg Chest Port 1  View  07/31/2015   CLINICAL DATA:  Productive cough, with production of dark brown mucus. Fever.  EXAM: PORTABLE CHEST 1 VIEW  COMPARISON:  07/29/2015  FINDINGS: No confluent airspace opacities or effusions. Heart is normal size. No acute bony abnormality. Degenerative changes in the shoulders.  IMPRESSION: No acute cardiopulmonary disease.   Electronically Signed   By: Charlett Nose M.D.   On: 07/31/2015 13:23      Anti-Infectives:   Anti-infectives    Start     Dose/Rate Route Frequency Ordered Stop   07/31/15 2200  vancomycin (VANCOCIN) IVPB 750 mg/150 ml premix  Status:  Discontinued     750 mg 150 mL/hr over 60 Minutes Intravenous Every 12 hours 07/31/15 1216 07/31/15 1444   07/31/15 1400  cefTAZidime (FORTAZ) 2 g in dextrose 5 % 50 mL IVPB  Status:  Discontinued     2 g 100 mL/hr over 30 Minutes Intravenous 3 times per day 07/31/15 1211 07/31/15 1444   07/31/15 1230  vancomycin (VANCOCIN) IVPB 1000 mg/200 mL premix     1,000 mg 200 mL/hr over 60 Minutes Intravenous  Once 07/31/15 1216 07/31/15 1401   07/29/15 1600  ceFAZolin (ANCEF) IVPB 2 g/50 mL premix     2 g 100 mL/hr over 30 Minutes Intravenous Every 8 hours 07/29/15 1324 07/30/15 0914   07/29/15 1024  polymyxin B 500,000 Units, bacitracin 50,000 Units in sodium chloride irrigation 0.9 % 500 mL irrigation  Status:  Discontinued       As needed 07/29/15 1024 07/29/15 1203   07/29/15 0830  clindamycin (CLEOCIN) IVPB 900 mg     900 mg 100 mL/hr over 30 Minutes Intravenous On call to O.R. 07/29/15 0818 07/29/15 0952   07/29/15 0830  ceFAZolin (ANCEF) IVPB 2 g/50 mL premix     2 g 100 mL/hr over 30 Minutes Intravenous On call to O.R. 07/29/15 0818 07/29/15 0950      Subjective:    Derek Bentley he relates his nausea has resolved, his passing gas but has  had several bowel movements. Denies shortage of breath or chest pain.  Objective:    Filed Vitals:   08/01/15 1049 08/01/15 1449 08/01/15 2016 08/02/15 0448  BP:  97/57 108/50 114/51 109/49  Pulse: 99 104 93 84  Temp: 98.1 F (36.7 C) 98.3 F (36.8 C) 98.6 F (37 C) 98.3 F (36.8 C)  TempSrc: Oral Oral Oral Oral  Resp: Height:      Weight:      SpO2: 98%  95% 94%    Intake/Output Summary (Last 24 hours) at 08/02/15 0855 Last data filed at 08/02/15 0750  Gross per 24 hour  Intake    840 ml  Output    679 ml  Net    161 ml   Filed Weights   07/29/15 0913 07/29/15 1314  Weight: 58.832 kg (129 lb 11.2 oz) 58.514 kg (129 lb)    Exam: Gen:  NAD Cardiovascular:  RRR, No M/R/G Respiratory:  Good air movement and clear to auscultation.  Gastrointestinal:  Abdomen soft, NT/ND, + BS Extremities:  No C/E/C   Data Reviewed:    Labs: Basic Metabolic Panel:  Recent Labs Lab 07/29/15 2300 07/30/15 0530 07/31/15 1020 08/01/15 0525 08/02/15 0515  NA 139 137 128* 137 139  K 4.1 4.1 4.0 3.9 3.4*  CL 106 105 96* 107 108  CO2 28 27 25 22 26   GLUCOSE 108* 128* 102* 80 86  BUN 8 8 15 11 8   CREATININE 0.84 0.87 0.78 0.90 0.80  CALCIUM 8.1* 8.3* 8.5* 8.2* 8.1*   GFR Estimated Creatinine Clearance: 65 mL/min (by C-G formula based on Cr of 0.8). Liver Function Tests:  Recent Labs Lab 07/31/15 1020  AST 28  ALT 11*  ALKPHOS 65  BILITOT 0.9  PROT 5.9*  ALBUMIN 3.3*   No results for input(s): LIPASE, AMYLASE in the last 168 hours. No results for input(s): AMMONIA in the last 168 hours. Coagulation profile  Recent Labs Lab 08/01/15 1156  INR 1.24    CBC:  Recent Labs Lab 07/29/15 2300 07/30/15 0530 07/31/15 1020 08/01/15 0835  WBC 8.6 9.6 13.8* 14.0*  HGB 11.8* 11.4* 12.7* 12.0*  HCT 34.8*  34.4* 34.2* 36.8* 35.5*  MCV 100.0 98.6 97.4 98.3  PLT 155 144* 152 155   Cardiac Enzymes:  Recent Labs Lab 08/01/15 0835 08/01/15 1434 08/01/15 2048  TROPONINI 0.04* 0.03 0.03   BNP (last 3 results) No results for input(s): PROBNP in the last 8760 hours. CBG: No results for input(s): GLUCAP in the last  168 hours. D-Dimer: No results for input(s): DDIMER in the last 72 hours. Hgb A1c: No results for input(s): HGBA1C in the last 72 hours. Lipid Profile: No results for input(s): CHOL, HDL, LDLCALC, TRIG, CHOLHDL, LDLDIRECT in the last 72 hours. Thyroid function studies: No results for input(s): TSH, T4TOTAL, T3FREE, THYROIDAB in the last 72 hours.  Invalid input(s): FREET3 Anemia work up: No results for input(s): VITAMINB12, FOLATE, FERRITIN, TIBC, IRON, RETICCTPCT in the last 72 hours. Sepsis Labs:  Recent Labs Lab 07/29/15 2300 07/30/15 0530 07/31/15 1020 08/01/15 0835 08/01/15 1156  WBC 8.6 9.6 13.8* 14.0*  --   LATICACIDVEN 1.0  --   --  1.4 1.6   Microbiology Recent Results (from the past 240 hour(s))  Surgical pcr screen     Status: Abnormal   Collection Time: 07/23/15 10:24 AM  Result Value Ref Range Status   MRSA, PCR NEGATIVE NEGATIVE Final   Staphylococcus aureus POSITIVE (A) NEGATIVE Final    Comment:        The Xpert SA Assay (FDA approved for NASAL specimens in patients over 95 years of age), is one component of a comprehensive surveillance program.  Test performance has been validated by [facility] for patients greater than or equal to 38 year old. It is not intended to diagnose infection nor to guide or monitor treatment.   Urine culture     Status: None   Collection Time: 07/31/15 11:32 AM  Result Value Ref Range Status   Specimen Description URINE, CATHETERIZED  Final   Special Requests NONE  Final   Culture   Final    NO GROWTH 1 DAY Performed at [facility]    Report Status 08/01/2015 FINAL  Final  Culture, blood (routine x 2) Call MD if unable to obtain prior to antibiotics being given     Status: None (Preliminary result)   Collection Time: 07/31/15 12:45 PM  Result Value Ref Range Status   Specimen Description  BLOOD RIGHT ARM  Final   Special Requests BOTTLES DRAWN AEROBIC AND ANAEROBIC  10CC  Final   Culture   Final    NO  GROWTH < 24 HOURS Performed at [facility]    Report Status PENDING  Incomplete  Culture, blood (routine x 2) Call MD if unable to obtain prior to antibiotics being given     Status: None (Preliminary result)   Collection Time: 07/31/15 12:50 PM  Result Value Ref Range Status   Specimen Description BLOOD LEFT HAND  Final   Special Requests BOTTLES DRAWN AEROBIC ONLY 1CC  Final   Culture   Final    NO GROWTH < 24 HOURS Performed at [facility]    Report Status PENDING  Incomplete  Culture, sputum-assessment     Status: None   Collection Time: 08/01/15  1:44 AM  Result Value Ref Range Status   Specimen Description SPUTUM  Final   Special Requests NONE  Final   Sputum evaluation   Final    MICROSCOPIC FINDINGS SUGGEST THAT THIS SPECIMEN IS NOT REPRESENTATIVE OF LOWER RESPIRATORY SECRETIONS. PLEASE RECOLLECT. CALLED TO Derek Bentley,Derek Bentley  ON 08/01/15 BY Derek Bentley,S.    Report Status 08/01/2015 FINAL  Final  Culture, expectorated sputum-assessment     Status: None   Collection Time: 08/02/15  5:12 AM  Result Value Ref Range Status   Specimen Description SPUTUM  Final   Special Requests NONE  Final   Sputum evaluation   Final    MICROSCOPIC FINDINGS SUGGEST THAT THIS SPECIMEN IS NOT REPRESENTATIVE OF LOWER RESPIRATORY SECRETIONS. PLEASE RECOLLECT. NOTIFIED Derek BEVERLY,Derek Bentley [id number] @ 0628 BY Derek Bentley    Report Status 08/02/2015 FINAL  Final     Medications:   . acidophilus  1 capsule Oral Daily  . docusate sodium  100 mg Oral BID  . pantoprazole  40 mg Oral Daily  . polyethylene glycol  17 g Oral Daily  . saccharomyces boulardii  250 mg Oral BID  . tamsulosin  0.4 mg Oral QPC supper  . vitamin B-12  100 mcg Oral Daily  . ascorbic acid  500 mg Oral Daily   Continuous Infusions: . dextrose 5 % 1,000 mL with potassium chloride 40 mEq infusion      Time spent: 25 min   LOS: 4 days   Derek Bentley  [facility] Pager [phone]  *Please refer  to [email or web address], password [email or web address] to get updated schedule on who will round on this patient, as hospitalists switch teams weekly. If 7PM-7AM, please contact night-coverage at [email or web address], password [email or web address] for any overnight needs.  08/02/2015, 8:55 AM

## 2015-08-02 NOTE — Progress Notes (Addendum)
Physical Therapy Treatment Patient Details Name: EARLIN SWEEDEN MRN: [id number] DOB: [birth year] Today's Date: 08/02/2015    History of Present Illness 79 yo male s/p DECOMPRESSION L4-L5 and L3-L4, MICRODISCECTOMY L4-L5due to spinal stenosis    PT Comments    Improved activity tolerance compared to last session. Pt continues to require Min assist for mobility. Per OT note, wife unable to physically assist pt. Feel pt may benefit from [facility] aide, in addition to therapies, to assist with ADLs. Will plan to practice stairs again prior to d/c.  HR 106 bpm during ambulation  Follow Up Recommendations  Home health PT;Supervision/Assistance - 24 hour; Home health Aide     Equipment Recommendations  None recommended by PT    Recommendations for Other Services       Precautions / Restrictions Precautions Precautions: Fall;Back Precaution Comments: Pt able to recall 3/3 precautions. Cues needed during activity Restrictions Weight Bearing Restrictions: No    Mobility  Bed Mobility Overal bed mobility: Needs Assistance Bed Mobility: Rolling;Sidelying to Sit Rolling: Min guard Sidelying to sit: Min assist       General bed mobility comments: VCs for safety, technique, adherence to precautions. Increased time. Assist for trunk. Pt did use bedrail  Transfers Overall transfer level: Needs assistance Equipment used: Rolling walker (2 wheeled) Transfers: Sit to/from Stand Sit to Stand: Min assist Stand pivot transfers: Mod assist       General transfer comment: assist to rise and steady. VCS safety, hand placement. Increased time to stabilize.   Ambulation/Gait Ambulation/Gait assistance: Min guard Ambulation Distance (Feet): 85 Feet Assistive device: Rolling walker (2 wheeled) Gait Pattern/deviations: Step-through pattern;Decreased stride length;Trunk flexed     General Gait Details: verbal cues for back precautions, increased thoracic and cervical flexion however pt  reports hx of this (likely due to spinal stenosis) ,encouraged upright posture as pt able to tolerate, distance limited by fatigue and pain today   Stairs            Wheelchair Mobility    Modified Rankin (Stroke Patients Only)       Balance     Sitting balance-Leahy Scale: Fair     Standing balance support: Bilateral upper extremity supported;During functional activity Standing balance-Leahy Scale: Poor                      Cognition Arousal/Alertness: Awake/alert Behavior During Therapy: WFL for tasks assessed/performed Overall Cognitive Status: Within Functional Limits for tasks assessed       Memory: Decreased recall of precautions              Exercises      General Comments        Pertinent Vitals/Pain Pain Assessment: 0-10 Pain Score: 7  Pain Location: back Pain Descriptors / Indicators: Aching;Sore Pain Intervention(s): Monitored during session;Repositioned    Home Living                      Prior Function            PT Goals (current goals can now be found in the care plan section) Progress towards PT goals: Progressing toward goals    Frequency  Min 5X/week    PT Plan Current plan remains appropriate    Co-evaluation             End of Session Equipment Utilized During Treatment: Gait belt Activity Tolerance: Patient tolerated treatment well Patient left: in chair;with call bell/phone within reach;with  chair alarm set;with family/visitor present     Time: 1423-1440 PT Time Calculation (min) (ACUTE ONLY): 17 min  Charges:  $Gait Training: 8-22 mins                    G Codes:      Rebeca Alert, MPT Pager: [phone]

## 2015-08-02 NOTE — Progress Notes (Signed)
Derek Bentley  MRN: [id number] DOB/Age: [birth year] 79 y.o. Physician: Jacquelyne Balint Procedure: Procedure(s) (LRB): DECOMPRESSION L4-L5 and L3-L4,  MICRODISCECTOMY L4-L5     (2 LEVELS) (N/A)     Subjective: Awake and alert. Right leg pain improving and modest back pain but getting better. Patient anxious to go home. Appreciate cardiology and medicines assistance. Several small bowel movements, he has some mild hiccups this am.  Vital Signs Temp:  [98.1 F (36.7 C)-98.6 F (37 C)] 98.3 F (36.8 C) (10/02 0448) Pulse Rate:  [84-104] 84 (10/02 0448) Resp:  [18-20] 20 (10/02 0448) BP: (92-114)/(49-59) 109/49 mmHg (10/02 0448) SpO2:  [94 %-98 %] 94 % (10/02 0448)  Lab Results  Recent Labs  07/31/15 1020 08/01/15 0835  WBC 13.8* 14.0*  HGB 12.7* 12.0*  HCT 36.8* 35.5*  PLT 152 155   BMET  Recent Labs  08/01/15 0525 08/02/15 0515  NA 137 139  K 3.9 3.4*  CL 107 108  CO2 22 26  GLUCOSE 80 86  BUN 11 8  CREATININE 0.90 0.80  CALCIUM 8.2* 8.1*   INR  Date Value Ref Range Status  08/01/2015 1.24 0.00 - 1.49 Final   EXAM Bilateral legs show good sensation and strength to DF/EHL Aquacel dressing in place, no surrounding erythema and no drainage        Plan Cardiology has ordered echo. Foley in place and Flomax started for urinary retention. Medicine managing Hopeful for DC tomorrow   [facility] for Dr.Kevin Supple 08/02/2015, 9:33 AM Contact # [phone]

## 2015-08-02 NOTE — Progress Notes (Addendum)
Occupational Therapy Treatment Patient Details Name: Derek Bentley MRN: [id number] DOB: 24-Sep-1936 Today's Date: 08/02/2015    History of present illness 79 yo male s/p DECOMPRESSION L4-L5 and L3-L4, MICRODISCECTOMY L4-L5due to spinal stenosis   OT comments  Pt up to chair with mod assist and walker today. He reports feeling weaker today and note that he had more difficulty with functional mobility than at eval. Wife present and states she is not able to physically assist pt at d/c due to back issues herself. He is currently unable to let go of the walker to perform tasks such as clothing management as he requires the walker for balance support. Pt may need to consider SNF if not able to progress to a safe level for d/c home with spouse. Will follow on acute.    Follow Up Recommendations  Other (comment);Supervision/Assistance - 24 hour;SNF (if pt not able to progress to d/c home, may need to consider SNF. If home, recommend HHOT)    Equipment Recommendations  3 in 1 bedside comode (already delivered to room)    Recommendations for Other Services      Precautions / Restrictions Precautions Precautions: Fall;Back Precaution Comments: pt able to state 2/3 precautions with min cues, reviewed all precautions with pt and wife. Restrictions Weight Bearing Restrictions: No       Mobility Bed Mobility Overal bed mobility: Needs Assistance Bed Mobility: Rolling;Sidelying to Sit Rolling: Min assist Sidelying to sit: Min assist       General bed mobility comments: verbal cues for log roll technique, encouraged pt to try to not use rail but requierd use of rail to help push up.   Transfers Overall transfer level: Needs assistance Equipment used: Rolling walker (2 wheeled) Transfers: Sit to/from [employer] Sit to Stand: Mod assist Stand pivot transfers: Mod assist       General transfer comment: assist to rise and steady. Cues for hand placement and back  precautions. Pt is shaky in LEs with pivot to chair using walker.    Balance     Sitting balance-Leahy Scale: Fair       Standing balance-Leahy Scale: Poor                     ADL                       Lower Body Dressing: Minimal assistance;Sitting/lateral leans;With adaptive equipment Lower Body Dressing Details (indicate cue type and reason): min assist to doff sock with reacher and don with sock aid. increased time and mod cues. total assist required for any clothing management of pulling up clothing as pt unable to let go of walker currently.  Toilet Transfer: Moderate assistance;Stand-pivot;RW             General ADL Comments: Wife present for session. Discussed AE options and coverage further as pt's wife states she has back problems so bending over to help pt with LB self care would be difficult. Explained how AE can help pt be more independent and also allow for decreased caregiver burden of helping pt with LB self care. Pt practiced with reacher and sock aid today at EOB. HR at 79 after activity. Pt having difficulty with sit to stand transitions today and mod cues for back precautions overall. He was shaky in LEs with pivot around to chair and pt reporting that he feels weaker compared to previous session.       Vision  Perception     Praxis      Cognition   Behavior During Therapy: WFL for tasks assessed/performed         Memory: Decreased recall of precautions               Extremity/Trunk Assessment               Exercises     Shoulder Instructions       General Comments      Pertinent Vitals/ Pain       Pain Assessment: 0-10 Pain Score: 7  Pain Location: back Pain Descriptors / Indicators: Aching Pain Intervention(s): Repositioned;Monitored during session  Home Living                                          Prior Functioning/Environment              Frequency  Min 2X/week     Progress Toward Goals  OT Goals(current goals can now be found in the care plan section)  Progress towards OT goals: Not progressing toward goals - comment (pt is overall weaker this session than at eval)     Plan Discharge plan needs to be updated    Co-evaluation                 End of Session Equipment Utilized During Treatment: Rolling walker   Activity Tolerance Patient limited by pain   Patient Left in chair;with call bell/phone within reach;with chair alarm set;with family/visitor present   Nurse Communication          Time: 6045-4098 OT Time Calculation (min): 31 min  Charges: OT General Charges $OT Visit: 1 Procedure OT Treatments $Self Care/Home Management : 8-22 mins $Therapeutic Activity: 8-22 mins  Lennox Laity  [phone] 08/02/2015, 12:09 PM

## 2015-08-03 DIAGNOSIS — N401 Enlarged prostate with lower urinary tract symptoms: Secondary | ICD-10-CM | POA: Diagnosis present

## 2015-08-03 DIAGNOSIS — Z8249 Family history of ischemic heart disease and other diseases of the circulatory system: Secondary | ICD-10-CM | POA: Diagnosis not present

## 2015-08-03 DIAGNOSIS — I9789 Other postprocedural complications and disorders of the circulatory system, not elsewhere classified: Secondary | ICD-10-CM | POA: Diagnosis present

## 2015-08-03 DIAGNOSIS — R918 Other nonspecific abnormal finding of lung field: Secondary | ICD-10-CM | POA: Diagnosis present

## 2015-08-03 DIAGNOSIS — K567 Ileus, unspecified: Secondary | ICD-10-CM | POA: Diagnosis not present

## 2015-08-03 DIAGNOSIS — M4806 Spinal stenosis, lumbar region: Secondary | ICD-10-CM

## 2015-08-03 DIAGNOSIS — R338 Other retention of urine: Secondary | ICD-10-CM | POA: Diagnosis present

## 2015-08-03 DIAGNOSIS — I4891 Unspecified atrial fibrillation: Secondary | ICD-10-CM | POA: Diagnosis not present

## 2015-08-03 DIAGNOSIS — M160 Bilateral primary osteoarthritis of hip: Secondary | ICD-10-CM | POA: Diagnosis present

## 2015-08-03 DIAGNOSIS — M5126 Other intervertebral disc displacement, lumbar region: Secondary | ICD-10-CM | POA: Diagnosis present

## 2015-08-03 DIAGNOSIS — M545 Low back pain: Secondary | ICD-10-CM | POA: Diagnosis present

## 2015-08-03 DIAGNOSIS — N400 Enlarged prostate without lower urinary tract symptoms: Secondary | ICD-10-CM | POA: Diagnosis not present

## 2015-08-03 LAB — BASIC METABOLIC PANEL
Anion gap: 3 — ABNORMAL LOW (ref 5–15)
CHLORIDE: 108 mmol/L (ref 101–111)
CO2: 28 mmol/L (ref 22–32)
Calcium: 8.2 mg/dL — ABNORMAL LOW (ref 8.9–10.3)
Creatinine, Ser: 0.76 mg/dL (ref 0.61–1.24)
GFR calc Af Amer: >60 mL/min (ref >60)
GFR calc non Af Amer: >60 mL/min (ref >60)
GLUCOSE: 98 mg/dL (ref 65–99)
POTASSIUM: 4.4 mmol/L (ref 3.5–5.1)
Sodium: 139 mmol/L (ref 135–145)

## 2015-08-03 LAB — LEGIONELLA PNEUMOPHILA SEROGP 1 UR AG: L. PNEUMOPHILA SEROGP 1 UR AG: NEGATIVE

## 2015-08-03 NOTE — Progress Notes (Signed)
Physical Therapy Treatment Patient Details Name: Derek Bentley MRN: [id number] DOB: 03/09/1936 Today's Date: 08/03/2015    History of Present Illness 79 yo male s/p DECOMPRESSION L4-L5 and L3-L4, MICRODISCECTOMY L4-L5due to spinal stenosis    PT Comments    Pt continues to require Min assist for bed mobility/standing and Min guard assist for ambulation and stair negotiation. Pain rated 5-6/10. Pt remains shaky when mobilizing. Discussed car transfer. Reviewed safety with pt and wife.   Follow Up Recommendations  Home health PT;Supervision/Assistance - 24 hour; home health aide    Equipment Recommendations  None recommended by PT    Recommendations for Other Services       Precautions / Restrictions Precautions Precautions: Back;Fall Precaution Comments: Cues needed during activity for back precautions and logroll technique Restrictions Weight Bearing Restrictions: No    Mobility  Bed Mobility Overal bed mobility: Needs Assistance Bed Mobility: Rolling;Sidelying to Sit Rolling: Min guard Sidelying to sit: Min assist       General bed mobility comments: VCs for safety, technique, adherence to precautions. Increased time. Assist for trunk. Pt did use bedrail  Transfers Overall transfer level: Needs assistance Equipment used: Rolling walker (2 wheeled) Transfers: Sit to/from Stand Sit to Stand: Min assist         General transfer comment: assist to rise and steady. VCS safety, hand placement. Increased time to stabilize.   Ambulation/Gait Ambulation/Gait assistance: Min guard Ambulation Distance (Feet): 75 Feet Assistive device: Rolling walker (2 wheeled) Gait Pattern/deviations: Trunk flexed;Decreased stride length;Step-through pattern     General Gait Details: verbal cues for back precautions, increased thoracic and cervical flexion however pt reports hx of this (likely due to spinal stenosis) ,encouraged upright posture as pt able to tolerate. very close  guard. pt is shaky but tolerated distance well    Stairs Stairs: Yes Stairs assistance: Min guard Stair Management: Step to pattern;Forwards;Two rails Number of Stairs: 2 General stair comments: verbal cues for safe technique, pt did not require any physical assist. very close guard for safety  Wheelchair Mobility    Modified Rankin (Stroke Patients Only)       Balance                                    Cognition Arousal/Alertness: Awake/alert Behavior During Therapy: WFL for tasks assessed/performed Overall Cognitive Status: Within Functional Limits for tasks assessed                      Exercises      General Comments        Pertinent Vitals/Pain Pain Assessment: 0-10 Pain Score: 6  Pain Location: back Pain Descriptors / Indicators: Sore Pain Intervention(s): Monitored during session;Repositioned    Home Living                      Prior Function            PT Goals (current goals can now be found in the care plan section) Progress towards PT goals: Progressing toward goals    Frequency  Min 5X/week    PT Plan Current plan remains appropriate    Co-evaluation             End of Session Equipment Utilized During Treatment: Gait belt Activity Tolerance: Patient tolerated treatment well Patient left: in chair;with call bell/phone within reach;with chair alarm set;with family/visitor present  Time: 0940-1005 PT Time Calculation (min) (ACUTE ONLY): 25 min  Charges:  $Gait Training: 8-22 mins $Therapeutic Activity: 8-22 mins                    G Codes:  Functional Assessment Tool Used: clinical judgement Functional Limitation: Mobility: Walking and moving around Mobility: Walking and Moving Around Goal Status [phone]): At least 1 percent but less than 20 percent impaired, limited or restricted Mobility: Walking and Moving Around Discharge Status [phone]): At least 1 percent but less than 20 percent impaired,  limited or restricted   Rebeca Alert, MPT Pager: [phone]

## 2015-08-03 NOTE — Progress Notes (Signed)
[facility] CONSULT PROGRESS NOTE    Progress Note   Derek Bentley WUJ:[id number] DOB: February 05, 1936 DOA: 07/29/2015 PCP: Thayer Headings, MD   Brief Narrative:   Derek Bentley is an 79 y.o. male past medical history of spinal stenosis and admitted on 07/29/2015 for surgical intervention for this current problem. We're consulted for transient hypotension. We were reconsulted as patient development nausea and vomiting and urinary retention.  Assessment/Plan:  Spinal stenosis of lumbar region Per surgery.  Transient hypotension: - Transient hypotension has resolved   Enlarged prostate with urinary retention: - I will insert a Foley, cont Flomax daily. Reevaluate him with a voiding trial in 2-4 weeks.  - follow up with urology as an outpatient.  Ileus: - Tolerating diet, no nausea and vomtting. - Decrease the use of narcotics, replete Potassium. He had  had several BM of night. - Will sign off.  Fever unclear source: - Started empirically on vancomycin and Fortaz due to his fever, repeated chest x-ray showed no infiltrates so empiric antibiotics were stopped, has remained afebrile for 48 hrs, off antibiotics. - His  hemoglobin has remained stable, abdominal exam is benign.  New onset atrial fibrillation: Postsurgical, now in SR.  Patient denies any chest pain or shortness of breath. Cycle cardiac biomarker's negative x3. Appreciate cardiology assistance.  DVT Prophylaxis - Lovenox ordered.  Family Communication: none Disposition Plan: Per orthopedic surgery. Code Status:     Code Status Orders        Start     Ordered   07/29/15 1325  Full code   Continuous     07/29/15 1324    Advance Directive Documentation        Most Recent Value   Type of Advance Directive  Healthcare Power of Attorney, Living will   Pre-existing out of facility DNR order (yellow form or pink MOST form)     "MOST" Form in Place?          IV Access:    Peripheral  IV   Procedures and diagnostic studies:   Dg Abd 1 View  08/01/2015   CLINICAL DATA:  Ileus. Lumbar spine surgery 3 days prior. No bowel movement 4 days.  EXAM: ABDOMEN - 1 VIEW  COMPARISON:  Abdominal radiograph from one day prior.  FINDINGS: There are no dilated small bowel loops. There is diffuse mild to moderate gaseous distention of the large bowel, which is worsened in the interval. No evidence of pneumatosis or pneumoperitoneum. Skin staples overlie the lower lumbar spine. Screws are partially visualized in the left femoral neck. Marked degenerative changes in the visualized thoracolumbar spine. Severe bilateral hip osteoarthritis. Laminectomy changes at L4.  IMPRESSION: Worsening mild to moderate gaseous distention of the large bowel without small bowel dilatation, in keeping with worsening mild to moderate large bowel adynamic ileus.   Electronically Signed   By: Delbert Phenix M.D.   On: 08/01/2015 16:21      Anti-Infectives:   Anti-infectives    Start     Dose/Rate Route Frequency Ordered Stop   07/31/15 2200  vancomycin (VANCOCIN) IVPB 750 mg/150 ml premix  Status:  Discontinued     750 mg 150 mL/hr over 60 Minutes Intravenous Every 12 hours 07/31/15 1216 07/31/15 1444   07/31/15 1400  cefTAZidime (FORTAZ) 2 g in dextrose 5 % 50 mL IVPB  Status:  Discontinued     2 g 100 mL/hr over 30 Minutes Intravenous 3 times per day 07/31/15 1211 07/31/15 1444   07/31/15 1230  vancomycin (VANCOCIN) IVPB 1000 mg/200 mL premix     1,000 mg 200 mL/hr over 60 Minutes Intravenous  Once 07/31/15 1216 07/31/15 1401   07/29/15 1600  ceFAZolin (ANCEF) IVPB 2 g/50 mL premix     2 g 100 mL/hr over 30 Minutes Intravenous Every 8 hours 07/29/15 1324 07/30/15 0914   07/29/15 1024  polymyxin B 500,000 Units, bacitracin 50,000 Units in sodium chloride irrigation 0.9 % 500 mL irrigation  Status:  Discontinued       As needed 07/29/15 1024 07/29/15 1203   07/29/15 0830  clindamycin (CLEOCIN) IVPB 900 mg      900 mg 100 mL/hr over 30 Minutes Intravenous On call to O.R. 07/29/15 0818 07/29/15 0952   07/29/15 0830  ceFAZolin (ANCEF) IVPB 2 g/50 mL premix     2 g 100 mL/hr over 30 Minutes Intravenous On call to O.R. 07/29/15 0818 07/29/15 0950      Subjective:    Derek Bentley no complains  Objective:    Filed Vitals:   08/02/15 1120 08/02/15 1340 08/02/15 2137 08/03/15 0445  BP:  111/62 94/48 101/40  Pulse: 79 71 75 76  Temp:  98 F (36.7 C) 98.5 F (36.9 C) 98.4 F (36.9 C)  TempSrc:  Oral Oral Oral  Resp:  Height:      Weight:      SpO2:  98% 98% 97%    Intake/Output Summary (Last 24 hours) at 08/03/15 0820 Last data filed at 08/03/15 0450  Gross per 24 hour  Intake   1750 ml  Output   2100 ml  Net   -350 ml   Filed Weights   07/29/15 0913 07/29/15 1314  Weight: 58.832 kg (129 lb 11.2 oz) 58.514 kg (129 lb)    Exam: Gen:  NAD Cardiovascular:  RRR, No M/R/G Respiratory:  Good air movement and clear to auscultation. Gastrointestinal:  Abdomen soft, NT/ND, + BS Extremities:  No C/E/C   Data Reviewed:    Labs: Basic Metabolic Panel:  Recent Labs Lab 07/30/15 0530 07/31/15 1020 08/01/15 0525 08/02/15 0515 08/03/15 0545  NA 137 128* 137 139 139  K 4.1 4.0 3.9 3.4* 4.4  CL 105 96* 107 108 108  CO2 GLUCOSE 128* 102* 80 86 98  BUN <5*  CREATININE 0.87 0.78 0.90 0.80 0.76  CALCIUM 8.3* 8.5* 8.2* 8.1* 8.2*   GFR Estimated Creatinine Clearance: 65 mL/min (by C-G formula based on Cr of 0.76). Liver Function Tests:  Recent Labs Lab 07/31/15 1020  AST 28  ALT 11*  ALKPHOS 65  BILITOT 0.9  PROT 5.9*  ALBUMIN 3.3*   No results for input(s): LIPASE, AMYLASE in the last 168 hours. No results for input(s): AMMONIA in the last 168 hours. Coagulation profile  Recent Labs Lab 08/01/15 1156  INR 1.24    CBC:  Recent Labs Lab 07/29/15 2300 07/30/15 0530 07/31/15 1020 08/01/15 0835  WBC 8.6 9.6 13.8* 14.0*   HGB 11.8* 11.4* 12.7* 12.0*  HCT 34.8*  34.4* 34.2* 36.8* 35.5*  MCV 100.0 98.6 97.4 98.3  PLT 155 144* 152 155   Cardiac Enzymes:  Recent Labs Lab 08/01/15 0835 08/01/15 1434 08/01/15 2048  TROPONINI 0.04* 0.03 0.03   BNP (last 3 results) No results for input(s): PROBNP in the last 8760 hours. CBG: No results for input(s): GLUCAP in the last 168 hours. D-Dimer: No results for input(s): DDIMER in the last  72 hours. Hgb A1c: No results for input(s): HGBA1C in the last 72 hours. Lipid Profile: No results for input(s): CHOL, HDL, LDLCALC, TRIG, CHOLHDL, LDLDIRECT in the last 72 hours. Thyroid function studies: No results for input(s): TSH, T4TOTAL, T3FREE, THYROIDAB in the last 72 hours.  Invalid input(s): FREET3 Anemia work up: No results for input(s): VITAMINB12, FOLATE, FERRITIN, TIBC, IRON, RETICCTPCT in the last 72 hours. Sepsis Labs:  Recent Labs Lab 07/29/15 2300 07/30/15 0530 07/31/15 1020 08/01/15 0835 08/01/15 1156  WBC 8.6 9.6 13.8* 14.0*  --   LATICACIDVEN 1.0  --   --  1.4 1.6   Microbiology Recent Results (from the past 240 hour(s))  Urine culture     Status: None   Collection Time: 07/31/15 11:32 AM  Result Value Ref Range Status   Specimen Description URINE, CATHETERIZED  Final   Special Requests NONE  Final   Culture   Final    NO GROWTH 1 DAY Performed at [facility]    Report Status 08/01/2015 FINAL  Final  Culture, blood (routine x 2) Call MD if unable to obtain prior to antibiotics being given     Status: None (Preliminary result)   Collection Time: 07/31/15 12:45 PM  Result Value Ref Range Status   Specimen Description BLOOD RIGHT ARM  Final   Special Requests BOTTLES DRAWN AEROBIC AND ANAEROBIC  10CC  Final   Culture   Final    NO GROWTH 2 DAYS Performed at [facility]    Report Status PENDING  Incomplete  Culture, blood (routine x 2) Call MD if unable to obtain prior to antibiotics being given     Status: None  (Preliminary result)   Collection Time: 07/31/15 12:50 PM  Result Value Ref Range Status   Specimen Description BLOOD LEFT HAND  Final   Special Requests BOTTLES DRAWN AEROBIC ONLY 1CC  Final   Culture   Final    NO GROWTH 2 DAYS Performed at [facility]    Report Status PENDING  Incomplete  Culture, sputum-assessment     Status: None   Collection Time: 08/01/15  1:44 AM  Result Value Ref Range Status   Specimen Description SPUTUM  Final   Special Requests NONE  Final   Sputum evaluation   Final    MICROSCOPIC FINDINGS SUGGEST THAT THIS SPECIMEN IS NOT REPRESENTATIVE OF LOWER RESPIRATORY SECRETIONS. PLEASE RECOLLECT. CALLED TO PURDY,J/6E  ON 08/01/15 BY KARCZEWSKI,S.    Report Status 08/01/2015 FINAL  Final  Culture, expectorated sputum-assessment     Status: None   Collection Time: 08/02/15  5:12 AM  Result Value Ref Range Status   Specimen Description SPUTUM  Final   Special Requests NONE  Final   Sputum evaluation   Final    MICROSCOPIC FINDINGS SUGGEST THAT THIS SPECIMEN IS NOT REPRESENTATIVE OF LOWER RESPIRATORY SECRETIONS. PLEASE RECOLLECT. NOTIFIED CINDY BEVERLY,RN [id number] @ 0628 BY J SCOTTON    Report Status 08/02/2015 FINAL  Final     Medications:   . acidophilus  1 capsule Oral Daily  . docusate sodium  100 mg Oral BID  . pantoprazole  40 mg Oral Daily  . polyethylene glycol  17 g Oral Daily  . saccharomyces boulardii  250 mg Oral BID  . tamsulosin  0.4 mg Oral QPC supper  . vitamin B-12  100 mcg Oral Daily  . ascorbic acid  500 mg Oral Daily   Continuous Infusions: . dextrose 5 % 1,000 mL with potassium chloride 40  mEq infusion 50 mL/hr at 08/03/15 0539    Time spent: 25 min   LOS: 5 days   Marinda Elk  [facility] Pager [phone]  *Please refer to [email or web address], password [email or web address] to get updated schedule on who will round on this patient, as hospitalists switch teams weekly. If 7PM-7AM, please contact night-coverage at  [email or web address], password [email or web address] for any overnight needs.  08/03/2015, 8:20 AM

## 2015-08-03 NOTE — Discharge Instructions (Signed)
Walk As Tolerated utilizing back precautions.  No bending, twisting, or lifting.  No driving for 2 weeks.   Aquacel dressing may remain in place until follow up. May shower with aquacel dressing in place. If the dressing peels off or becomes saturated, you may remove aquacel dressing and place gauze and tape dressing which should be kept clean and dry and changed daily. Do not remove steri-strips if they are present. See Dr. Shelle Iron in office in 10 to 14 days. Begin taking aspirin  per day starting 4 days after your surgery if not allergic to aspirin or on another blood thinner. Walk daily even outside. Use a cane or walker only if necessary. Avoid sitting on soft sofas.  Follow up with [facility] in 1 week- You will need to call them to make an appointment [phone]

## 2015-08-03 NOTE — Progress Notes (Signed)
Patient ID: BURNHAM TROST, male   DOB: 1936/03/23, 79 y.o.   MRN: [id number]    Subjective:  Denies SSCP, palpitations or Dyspnea Wants to go home   Objective:  Filed Vitals:   08/02/15 1120 08/02/15 1340 08/02/15 2137 08/03/15 0445  BP:  111/62 94/48 101/40  Pulse: 79 71 75 76  Temp:  98 F (36.7 C) 98.5 F (36.9 C) 98.4 F (36.9 C)  TempSrc:  Oral Oral Oral  Resp:  Height:      Weight:      SpO2:  98% 98% 97%    Intake/Output from previous day:  Intake/Output Summary (Last 24 hours) at 08/03/15 4098 Last data filed at 08/03/15 0450  Gross per 24 hour  Intake   1750 ml  Output   2100 ml  Net   -350 ml    Physical Exam: Affect appropriate Healthy:  appears stated age HEENT: normal Neck supple with no adenopathy JVP normal no bruits no thyromegaly Lungs clear with no wheezing and good diaphragmatic motion Heart:  S1/S2 no murmur, no rub, gallop or click PMI normal Abdomen: benighn, BS positve, no tenderness, no AAA no bruit.  No HSM or HJR Distal pulses intact with no bruits No edema Neuro non-focal Skin warm and dry No muscular weakness Back surgery this admission   Lab Results: Basic Metabolic Panel:  Recent Labs  11/91/47 0515 08/03/15 0545  NA 139 139  K 3.4* 4.4  CL 108 108  CO2 26 28  GLUCOSE 86 98  BUN 8 <5*  CREATININE 0.80 0.76  CALCIUM 8.1* 8.2*   Liver Function Tests:  Recent Labs  07/31/15 1020  AST 28  ALT 11*  ALKPHOS 65  BILITOT 0.9  PROT 5.9*  ALBUMIN 3.3*   CBC:  Recent Labs  07/31/15 1020 08/01/15 0835  WBC 13.8* 14.0*  HGB 12.7* 12.0*  HCT 36.8* 35.5*  MCV 97.4 98.3  PLT 152 155   Cardiac Enzymes:  Recent Labs  08/01/15 0835 08/01/15 1434 08/01/15 2048  TROPONINI 0.04* 0.03 0.03    Imaging: Dg Abd 1 View  08/01/2015   CLINICAL DATA:  Ileus. Lumbar spine surgery 3 days prior. No bowel movement 4 days.  EXAM: ABDOMEN - 1 VIEW  COMPARISON:  Abdominal radiograph from one day prior.   FINDINGS: There are no dilated small bowel loops. There is diffuse mild to moderate gaseous distention of the large bowel, which is worsened in the interval. No evidence of pneumatosis or pneumoperitoneum. Skin staples overlie the lower lumbar spine. Screws are partially visualized in the left femoral neck. Marked degenerative changes in the visualized thoracolumbar spine. Severe bilateral hip osteoarthritis. Laminectomy changes at L4.  IMPRESSION: Worsening mild to moderate gaseous distention of the large bowel without small bowel dilatation, in keeping with worsening mild to moderate large bowel adynamic ileus.   Electronically Signed   By: Delbert Phenix M.D.   On: 08/01/2015 16:21    Cardiac Studies:  ECG:     Telemetry:  NSR afib gone  08/03/2015   Echo:  EF 50-55% no RWMA  Medications:   . acidophilus  1 capsule Oral Daily  . docusate sodium  100 mg Oral BID  . pantoprazole  40 mg Oral Daily  . polyethylene glycol  17 g Oral Daily  . saccharomyces boulardii  250 mg Oral BID  . tamsulosin  0.4 mg Oral QPC supper  . vitamin B-12  100 mcg Oral Daily  . ascorbic  acid  500 mg Oral Daily     . dextrose 5 % 1,000 mL with potassium chloride 40 mEq infusion 50 mL/hr at 08/03/15 0539    Assessment/Plan:   1. New onset atrial fibrillation with RVR now converted to NSR: This is most likely due to high catecholamine state post op.BP too soft for BB or CCB. Check 2D echo to assess LV function and LA size. His CHADS2VASC score is 2(based only on age > 24).Would probably not anticoagulate long term since this is in setting of post op state. Will arrange outpatient f/u and event monitor   2. Inferior infarct on EKG - echo with low normal EF and no RWMAls   Will sign off patient wants to go home   Charlton Haws 08/03/2015, 8:07 AM

## 2015-08-03 NOTE — Progress Notes (Signed)
Subjective: 5 Days Post-Op Procedure(s) (LRB): DECOMPRESSION L4-L5 and L3-L4,  MICRODISCECTOMY L4-L5     (2 LEVELS) (N/A) Patient reports pain as 3 on 0-10 scale.    Objective: Vital signs in last 24 hours: Temp:  [98 F (36.7 C)-98.5 F (36.9 C)] 98.4 F (36.9 C) (10/03 0445) Pulse Rate:  [71-76] 76 (10/03 0445) Resp:  [18] 18 (10/03 0445) BP: (94-111)/(40-62) 101/40 mmHg (10/03 0445) SpO2:  [97 %-98 %] 97 % (10/03 0445)  Intake/Output from previous day: 10/02 0701 - 10/03 0700 In: 1870 [P.O.:1200; I.V.:670] Out: 2100 [Urine:2100] Intake/Output this shift:     Recent Labs  08/01/15 0835  HGB 12.0*    Recent Labs  08/01/15 0835  WBC 14.0*  RBC 3.61*  HCT 35.5*  PLT 155    Recent Labs  08/02/15 0515 08/03/15 0545  NA 139 139  K 3.4* 4.4  CL 108 108  CO2 26 28  BUN 8 <5*  CREATININE 0.80 0.76  GLUCOSE 86 98  CALCIUM 8.1* 8.2*    Recent Labs  08/01/15 1156  INR 1.24    Neurologically intact Sensation intact distally Intact pulses distally Dorsiflexion/Plantar flexion intact Incision: dressing C/D/I  Assessment/Plan: 5 Days Post-Op Procedure(s) (LRB): DECOMPRESSION L4-L5 and L3-L4,  MICRODISCECTOMY L4-L5     (2 LEVELS) (N/A) Discharge home with home health  Thelda Gagan C 08/03/2015, 12:33 PM

## 2015-08-03 NOTE — Discharge Summary (Signed)
Physician Discharge Summary   Patient ID: Derek Bentley MRN: [id number] DOB/AGE: [birth year] 77 y.o.  Admit date: 07/29/2015 Discharge date: 08/03/2015  Primary Diagnosis:   SPINAL STENOSIS L4-L5 and L3-L4  Admission Diagnoses:  Past Medical History  Diagnosis Date  . Arthritis    Discharge Diagnoses:   Principal Problem:   Spinal stenosis of lumbar region Active Problems:   Transient hypotension   Abnormal chest x-ray   Enlarged prostate with urinary retention   Nausea and vomiting   Postoperative atrial fibrillation (HCC)   Fever   Ileus (HCC)  Procedure:  Procedure(s) (LRB): DECOMPRESSION L4-L5 and L3-L4,  MICRODISCECTOMY L4-L5     (2 LEVELS) (N/A)   Consults: cardiology and hospitalists  HPI:  see H&P    Laboratory Data: Hospital Outpatient Visit on 07/23/2015  Component Date Value Ref Range Status  . Sodium 07/23/2015 141  135 - 145 mmol/L Final  . Potassium 07/23/2015 3.8  3.5 - 5.1 mmol/L Final  . Chloride 07/23/2015 108  101 - 111 mmol/L Final  . CO2 07/23/2015 27  22 - 32 mmol/L Final  . Glucose, Bld 07/23/2015 132* 65 - 99 mg/dL Final  . BUN 07/23/2015 10  6 - 20 mg/dL Final  . Creatinine, Ser 07/23/2015 0.86  0.61 - 1.24 mg/dL Final  . Calcium 07/23/2015 9.2  8.9 - 10.3 mg/dL Final  . GFR calc non Af Amer 07/23/2015 >60  >60 mL/min Final  . GFR calc Af Amer 07/23/2015 >60  >60 mL/min Final   Comment: (NOTE) The eGFR has been calculated using the CKD EPI equation. This calculation has not been validated in all clinical situations. eGFR's persistently <60 mL/min signify possible Chronic Kidney Disease.   . Anion gap 07/23/2015 6  5 - 15 Final  . WBC 07/23/2015 4.5  4.0 - 10.5 K/uL Final  . RBC 07/23/2015 4.18* 4.22 - 5.81 MIL/uL Final  . Hemoglobin 07/23/2015 13.9  13.0 - 17.0 g/dL Final  . HCT 07/23/2015 41.3  39.0 - 52.0 % Final  . MCV 07/23/2015 98.8  78.0 - 100.0 fL Final  . MCH 07/23/2015 33.3  26.0 - 34.0 pg Final  . MCHC 07/23/2015 33.7   30.0 - 36.0 g/dL Final  . RDW 07/23/2015 13.1  11.5 - 15.5 % Final  . Platelets 07/23/2015 185  150 - 400 K/uL Final  . MRSA, PCR 07/23/2015 NEGATIVE  NEGATIVE Final  . Staphylococcus aureus 07/23/2015 POSITIVE* NEGATIVE Final   Comment:        The Xpert SA Assay (FDA approved for NASAL specimens in patients over 32 years of age), is one component of a comprehensive surveillance program.  Test performance has been validated by [facility] for patients greater than or equal to 62 year old. It is not intended to diagnose infection nor to guide or monitor treatment.     Recent Labs  08/01/15 0835  HGB 12.0*    Recent Labs  08/01/15 0835  WBC 14.0*  RBC 3.61*  HCT 35.5*  PLT 155    Recent Labs  08/02/15 0515 08/03/15 0545  NA 139 139  K 3.4* 4.4  CL 108 108  CO2 26 28  BUN 8 <5*  CREATININE 0.80 0.76  GLUCOSE 86 98  CALCIUM 8.1* 8.2*    Recent Labs  08/01/15 1156  INR 1.24    X-Rays:Dg Lumbar Spine 2-3 Views  07/23/2015   CLINICAL DATA:  Lumbar spine surgery.  EXAM: LUMBAR SPINE - 2-3 VIEW  COMPARISON:  06/03/2015.  FINDINGS: Paraspinal soft tissues normal. Diffuse severe degenerative changes noted of the lumbar spine. Stable T12 compression fracture. Stable mild scoliosis lumbar spine concave left.  IMPRESSION: Diffuse degenerative changes scoliosis lumbar spine. Stable mild T12 compression fracture. No new changes. No acute abnormality .   Electronically Signed   By: Marcello Moores  Register   On: 07/23/2015 13:59   Dg Abd 1 View  08/01/2015   CLINICAL DATA:  Ileus. Lumbar spine surgery 3 days prior. No bowel movement 4 days.  EXAM: ABDOMEN - 1 VIEW  COMPARISON:  Abdominal radiograph from one day prior.  FINDINGS: There are no dilated small bowel loops. There is diffuse mild to moderate gaseous distention of the large bowel, which is worsened in the interval. No evidence of pneumatosis or pneumoperitoneum. Skin staples overlie the lower lumbar spine. Screws are  partially visualized in the left femoral neck. Marked degenerative changes in the visualized thoracolumbar spine. Severe bilateral hip osteoarthritis. Laminectomy changes at L4.  IMPRESSION: Worsening mild to moderate gaseous distention of the large bowel without small bowel dilatation, in keeping with worsening mild to moderate large bowel adynamic ileus.   Electronically Signed   By: Ilona Sorrel M.D.   On: 08/01/2015 16:21   Dg Abd 1 View  07/31/2015   CLINICAL DATA:  Nausea, vomiting today.  EXAM: ABDOMEN - 1 VIEW  COMPARISON:  None.  FINDINGS: Mild gaseous distention of bowel, predominantly colon. This may reflect mild ileus. No supine evidence of free air organomegaly. No evidence for obstruction. Advanced degenerative changes in the hips. Degenerative changes in the lumbar spine.  IMPRESSION: Mild gaseous distention of bowel in the upper abdomen, likely ileus.   Electronically Signed   By: Rolm Baptise M.D.   On: 07/31/2015 10:50   Dg Chest Port 1 View  07/31/2015   CLINICAL DATA:  Productive cough, with production of dark brown mucus. Fever.  EXAM: PORTABLE CHEST 1 VIEW  COMPARISON:  07/29/2015  FINDINGS: No confluent airspace opacities or effusions. Heart is normal size. No acute bony abnormality. Degenerative changes in the shoulders.  IMPRESSION: No acute cardiopulmonary disease.   Electronically Signed   By: Rolm Baptise M.D.   On: 07/31/2015 13:23   Dg Chest Port 1 View  07/29/2015   CLINICAL DATA:  Abnormally low blood pressure  EXAM: PORTABLE CHEST 1 VIEW  COMPARISON:  July 15, 2015  FINDINGS: The heart size and mediastinal contours are stable. There is bilateral increased pulmonary interstitium. There is no focal pneumonia or pleural effusion. The visualized skeletal structures are stable.  IMPRESSION: Diffuse increased pulmonary interstitium bilaterally. This is nonspecific but can be seen in interstitial edema or viral pneumonitis.   Electronically Signed   By: Abelardo Diesel M.D.    On: 07/29/2015 23:07   Dg Spine Portable 1 View  07/29/2015   CLINICAL DATA:  Patient for L3-5 lumbar surgery. Intraoperative localization film.  EXAM: PORTABLE SPINE - 1 VIEW  COMPARISON:  Intraoperative localization films earlier today.  FINDINGS: Clamp remains on the L4 spinous process. 3 probes are in place. The most superior projects over the L3-4 foramina. Second more inferior probe projects over the L4-5 foramina. A third probe is at the level of the superior endplate of L5.  IMPRESSION: Localization as above.   Electronically Signed   By: Inge Rise M.D.   On: 07/29/2015 11:40   Dg Spine Portable 1 View  07/29/2015   CLINICAL DATA:  Patient for lumbar surgery L3-5. Intraoperative localization film.  EXAM:  PORTABLE SPINE - 1 VIEW  COMPARISON:  Intraoperative localization film earlier today.  FINDINGS: Clamp is now in place along the L4 spinous process. Probe superior to the clamp is directed toward the L3-4 interspace and probe inferior to the spinous process is directed toward the L4-5 interspace.  IMPRESSION: Localization as above.  Clamp is on the L4 spinous process.   Electronically Signed   By: Drusilla Kanner M.D.   On: 07/29/2015 10:53   Dg Spine Portable 1 View  07/29/2015   CLINICAL DATA:  Surgical level L3-4, L4-5.  EXAM: PORTABLE SPINE - 1 VIEW  COMPARISON:  07/23/2015  FINDINGS: Posterior surgical instruments are directed at the L3 and L5 spinous processes  IMPRESSION: Intraoperative localization as above.   Electronically Signed   By: Charlett Nose M.D.   On: 07/29/2015 10:36    EKG: Orders placed or performed during the hospital encounter of 07/29/15  . ED EKG  . ED EKG  . EKG 12-Lead  . EKG 12-Lead  . EKG 12-Lead  . EKG 12-Lead  . EKG 12-Lead  . EKG 12-Lead  . EKG 12-Lead  . EKG 12-Lead     Hospital Course: Patient was admitted to [facility] and taken to the OR and underwent the above state procedure without complications.  Patient tolerated the  procedure well and was later transferred to the recovery room and then to the orthopaedic floor for postoperative care.  They were given PO and IV analgesics for pain control following their surgery.  They were given 24 hours of postoperative antibiotics.   PT was consulted postop to assist with mobility and transfers.  The patient was allowed to be WBAT with therapy and was taught back precautions. Discharge planning was consulted to help with postop disposition and equipment needs.  Patient had a fair night on the evening of surgery and started to get up OOB with therapy on day one. Initially PT was limited due to pain, hypotension. He also experienced urinary retention s/p foley removal, bladder scanned and I&O cathed per protocol. On POD 2 a foley was reinserted for ongoing urinary retention. He was re-evaluated by the hospitalist service. His hypotension remained stable. He was also experiencing N/V and hiccups, was found to have an ileus. He also had an episode of A. Fib and was seen by cardiology which was felt to be an isolated incident. Patient was seen in rounds daily and was ready to go home on day five.  They were given discharge instructions and dressing directions. They were instructed on when to follow up in the office with Dr. Shelle Iron.   Diet: Regular diet Activity:WBAT Follow-up: 10-14 days post-op Disposition - Home Discharged Condition: good   Discharge Instructions    Call MD / Call 911    Complete by:  As directed   If you experience chest pain or shortness of breath, CALL 911 and be transported to the hospital emergency room.  If you develope a fever above 101 F, pus (white drainage) or increased drainage or redness at the wound, or calf pain, call your surgeon's office.     Call MD / Call 911    Complete by:  As directed   If you experience chest pain or shortness of breath, CALL 911 and be transported to the hospital emergency room.  If you develope a fever above 101 F, pus  (white drainage) or increased drainage or redness at the wound, or calf pain, call your surgeon's office.  Constipation Prevention    Complete by:  As directed   Drink plenty of fluids.  Prune juice may be helpful.  You may use a stool softener, such as Colace (over the counter) 100 mg twice a day.  Use MiraLax (over the counter) for constipation as needed.     Constipation Prevention    Complete by:  As directed   Drink plenty of fluids.  Prune juice may be helpful.  You may use a stool softener, such as Colace (over the counter) 100 mg twice a day.  Use MiraLax (over the counter) for constipation as needed.     Diet - low sodium heart healthy    Complete by:  As directed      Diet - low sodium heart healthy    Complete by:  As directed      Increase activity slowly as tolerated    Complete by:  As directed      Increase activity slowly as tolerated    Complete by:  As directed             Medication List    STOP taking these medications        TURMERIC PO      TAKE these medications        acetaminophen 500 MG tablet  Commonly known as:  TYLENOL  Take 500 mg by mouth every 6 (six) hours as needed for mild pain or headache.     ascorbic acid 500 MG tablet  Commonly known as:  VITAMIN C  Take 500 mg by mouth daily.     calcium-vitamin D 500-200 MG-UNIT tablet  Commonly known as:  OSCAL WITH D  Take 1 tablet by mouth daily with breakfast.     docusate sodium 100 MG capsule  Commonly known as:  COLACE  Take 1 capsule (100 mg total) by mouth 2 (two) times daily as needed for mild constipation.     FOLIC ACID PO  Take 1 tablet by mouth daily.     HYDROcodone-acetaminophen 5-325 MG tablet  Commonly known as:  NORCO/VICODIN  Take 1 tablet by mouth every 4 (four) hours as needed.     MAGNESIUM PO  Take 1 tablet by mouth daily.     multivitamin with minerals Tabs tablet  Take 1 tablet by mouth daily.     tamsulosin 0.4 MG Caps capsule  Commonly known as:  FLOMAX    Take 1 capsule (0.4 mg total) by mouth daily after supper.     VITAMIN B 12 PO  Take 1 tablet by mouth daily.           Follow-up Information    Follow up with Johnn Hai, MD. Go on 08/12/2015.   Specialty:  Orthopedic Surgery   Why:  At 12:00pm for post hospitalization follow-up and for suture removal   Contact information:   [address] [place] [address] [phone]       Follow up with [facility].   Why:  home health physical therapy and occupational therapy   Contact information:   [address] [place] [address] [phone]       Follow up with Nickie Retort, MD. Go on 09/09/2015.   Specialty:  Urology   Why:  At 9:00am for post hospitalization follow-up, Bring insurance and photo ID, Bring current medication list or bottles   Contact information:   [place] [address] [phone]  Follow up with [facility] Schedule an appointment as soon as possible for a visit in 1 week.   Contact information:   [place] [address] [phone]       Signed: Lacie Draft, PA-C Orthopaedic Surgery 08/03/2015, 1:53 PM

## 2015-08-03 NOTE — Progress Notes (Signed)
D/c'd to home w/ wife via w/c voices no c/o.

## 2015-08-03 NOTE — Progress Notes (Signed)
Occupational Therapy Treatment Patient Details Name: Derek Bentley MRN: [id number] DOB: August 07, 1936 Today's Date: 08/03/2015    History of present illness 79 yo male s/p DECOMPRESSION L4-L5 and L3-L4, MICRODISCECTOMY L4-L5due to spinal stenosis   OT comments  Dc home this day. Pt is a high fall risk and needed mod A. Explained need for rehab to pt - he refused   Follow Up Recommendations  Other (comment);Supervision/Assistance - 24 hour;SNF;Home health OT    Equipment Recommendations       Recommendations for Other Services      Precautions / Restrictions Precautions Precautions: Back;Fall Precaution Comments: Cues needed during activity for back precautions  Restrictions Weight Bearing Restrictions: No       Mobility Bed Mobility Overal bed mobility: Needs Assistance Bed Mobility: Rolling;Sidelying to Sit Rolling: Min guard Sidelying to sit: Mod assist       General bed mobility comments: VCs for safety, technique, adherence to precautions. Increased time. Assist for trunk. Pt did use bedrail  Transfers Overall transfer level: Needs assistance Equipment used: Rolling walker (2 wheeled) Transfers: Sit to/from Stand Sit to Stand: Mod assist Stand pivot transfers: Mod assist       General transfer comment: assist to rise and steady. VCS safety, hand placement. Increased time to stabilize.     Balance                                   ADL Overall ADL's : Needs assistance/impaired Eating/Feeding: Set up;Sitting       Upper Body Bathing: Set up;Sitting   Lower Body Bathing: Maximal assistance;Sit to/from stand;Cueing for sequencing;Cueing for safety       Lower Body Dressing: Maximal assistance;Sit to/from stand;Cueing for sequencing;Cueing for safety   Toilet Transfer: Moderate assistance;Stand-pivot;RW       Tub/ Shower Transfer: Moderate assistance   Functional mobility during ADLs: Moderate assistance General ADL Comments: Pt mod  A this day.  Feel DC home with wife unsafe but pt not willing to consider rehab and wants to go home.  Pt insistant on going home      Vision                     Perception     Praxis      Cognition   Behavior During Therapy: WFL for tasks assessed/performed Overall Cognitive Status: Within Functional Limits for tasks assessed Area of Impairment: Safety/judgement     Memory: Decreased recall of precautions               Extremity/Trunk Assessment               Exercises     Shoulder Instructions       General Comments      Pertinent Vitals/ Pain       Pain Assessment: 0-10 Pain Score: 3  Pain Location: back Pain Descriptors / Indicators: Sore Pain Intervention(s): Limited activity within patient's tolerance;Monitored during session  Home Living                                          Prior Functioning/Environment              Frequency       Progress Toward Goals  OT Goals(current goals can now be found in the care  plan section)        Plan Discharge plan remains appropriate    Co-evaluation                 End of Session Equipment Utilized During Treatment: Rolling walker   Activity Tolerance Patient tolerated treatment well   Patient Left in chair   Nurse Communication Mobility status        Time: 5284-1324 OT Time Calculation (min): 16 min  Charges: OT General Charges $OT Visit: 1 Procedure OT Treatments $Self Care/Home Management : 8-22 mins  Alysiana Ethridge, Karin Golden D 08/03/2015, 2:09 PM

## 2015-08-03 NOTE — Care Management Note (Signed)
Case Management Note  Patient Details  Name: Derek Bentley MRN: [id number] Date of Birth: 12-06-1935  Subjective/Objective:  PT-recc HHPT.Patient/spouse chose [facility] for HHC-HHPT/HHOT ordered.Has 3n1 already in rm.                 Action/Plan:d/c home w/HHC.   Expected Discharge Date:                  Expected Discharge Plan:  Home w Home Health Services  In-House Referral:     Discharge planning Services  CM Consult  Post Acute Care Choice:  Home Health Choice offered to:  Patient  DME Arranged:  3-N-1 DME Agency:  [facility]  HH Arranged:  PT, OT [facility] Agency:  [facility]  Status of Service:  Completed, signed off  Medicare Important Message Given:    Date Medicare IM Given:    Medicare IM give by:    Date Additional Medicare IM Given:    Additional Medicare Important Message give by:     If discussed at Long Length of Stay Meetings, dates discussed:    Additional Comments:  Lanier Clam, RN 08/03/2015, 1:08 PM

## 2015-08-05 LAB — CULTURE, BLOOD (ROUTINE X 2)
CULTURE: NO GROWTH
Culture: NO GROWTH

## 2015-09-09 ENCOUNTER — Encounter: Payer: Self-pay | Admitting: Gastroenterology

## 2015-10-18 ENCOUNTER — Emergency Department (HOSPITAL_COMMUNITY): Payer: Medicare Other

## 2015-10-18 ENCOUNTER — Encounter (HOSPITAL_COMMUNITY): Payer: Self-pay | Admitting: *Deleted

## 2015-10-18 ENCOUNTER — Inpatient Hospital Stay (HOSPITAL_COMMUNITY): Payer: Medicare Other

## 2015-10-18 ENCOUNTER — Inpatient Hospital Stay (HOSPITAL_COMMUNITY)
Admission: EM | Admit: 2015-10-18 | Discharge: 2015-10-23 | DRG: 389 | Disposition: A | Discharge status: Home Health | Payer: Medicare Other | Attending: Internal Medicine | Admitting: Internal Medicine

## 2015-10-18 DIAGNOSIS — M549 Dorsalgia, unspecified: Secondary | ICD-10-CM | POA: Diagnosis present

## 2015-10-18 DIAGNOSIS — Z79899 Other long term (current) drug therapy: Secondary | ICD-10-CM

## 2015-10-18 DIAGNOSIS — K565 Intestinal adhesions [bands] with obstruction (postprocedural) (postinfection): Principal | ICD-10-CM | POA: Diagnosis present

## 2015-10-18 DIAGNOSIS — N179 Acute kidney failure, unspecified: Secondary | ICD-10-CM | POA: Insufficient documentation

## 2015-10-18 DIAGNOSIS — R066 Hiccough: Secondary | ICD-10-CM | POA: Diagnosis present

## 2015-10-18 DIAGNOSIS — M1990 Unspecified osteoarthritis, unspecified site: Secondary | ICD-10-CM | POA: Diagnosis present

## 2015-10-18 DIAGNOSIS — I959 Hypotension, unspecified: Secondary | ICD-10-CM | POA: Diagnosis present

## 2015-10-18 DIAGNOSIS — N400 Enlarged prostate without lower urinary tract symptoms: Secondary | ICD-10-CM | POA: Diagnosis present

## 2015-10-18 DIAGNOSIS — R109 Unspecified abdominal pain: Secondary | ICD-10-CM | POA: Diagnosis present

## 2015-10-18 DIAGNOSIS — E861 Hypovolemia: Secondary | ICD-10-CM | POA: Diagnosis present

## 2015-10-18 DIAGNOSIS — R1010 Upper abdominal pain, unspecified: Secondary | ICD-10-CM | POA: Diagnosis not present

## 2015-10-18 DIAGNOSIS — E872 Acidosis, unspecified: Secondary | ICD-10-CM | POA: Diagnosis present

## 2015-10-18 DIAGNOSIS — R001 Bradycardia, unspecified: Secondary | ICD-10-CM | POA: Diagnosis present

## 2015-10-18 DIAGNOSIS — R1032 Left lower quadrant pain: Secondary | ICD-10-CM | POA: Diagnosis not present

## 2015-10-18 DIAGNOSIS — I4891 Unspecified atrial fibrillation: Secondary | ICD-10-CM | POA: Diagnosis present

## 2015-10-18 DIAGNOSIS — R609 Edema, unspecified: Secondary | ICD-10-CM | POA: Diagnosis present

## 2015-10-18 DIAGNOSIS — N320 Bladder-neck obstruction: Secondary | ICD-10-CM | POA: Diagnosis present

## 2015-10-18 DIAGNOSIS — R031 Nonspecific low blood-pressure reading: Secondary | ICD-10-CM | POA: Diagnosis not present

## 2015-10-18 DIAGNOSIS — I4892 Unspecified atrial flutter: Secondary | ICD-10-CM | POA: Diagnosis present

## 2015-10-18 DIAGNOSIS — K56609 Unspecified intestinal obstruction, unspecified as to partial versus complete obstruction: Secondary | ICD-10-CM | POA: Insufficient documentation

## 2015-10-18 DIAGNOSIS — E876 Hypokalemia: Secondary | ICD-10-CM | POA: Insufficient documentation

## 2015-10-18 DIAGNOSIS — I442 Atrioventricular block, complete: Secondary | ICD-10-CM | POA: Diagnosis present

## 2015-10-18 DIAGNOSIS — K5669 Other intestinal obstruction: Secondary | ICD-10-CM | POA: Diagnosis not present

## 2015-10-18 LAB — I-STAT TROPONIN, ED: Troponin i, poc: 0.05 ng/mL (ref 0.00–0.08)

## 2015-10-18 LAB — COMPREHENSIVE METABOLIC PANEL
ALBUMIN: 3 g/dL — AB (ref 3.5–5.0)
ALT: 40 U/L (ref 17–63)
ANION GAP: 25 — AB (ref 5–15)
AST: 81 U/L — AB (ref 15–41)
Alkaline Phosphatase: 73 U/L (ref 38–126)
BILIRUBIN TOTAL: 1.6 mg/dL — AB (ref 0.3–1.2)
BUN: 56 mg/dL — AB (ref 6–20)
CO2: 15 mmol/L — ABNORMAL LOW (ref 22–32)
Calcium: 9 mg/dL (ref 8.9–10.3)
Chloride: 101 mmol/L (ref 101–111)
Creatinine, Ser: 2.58 mg/dL — ABNORMAL HIGH (ref 0.61–1.24)
GFR calc Af Amer: 26 mL/min — ABNORMAL LOW (ref >60)
GFR, EST NON AFRICAN AMERICAN: 22 mL/min — AB (ref >60)
Glucose, Bld: 167 mg/dL — ABNORMAL HIGH (ref 65–99)
POTASSIUM: 3.1 mmol/L — AB (ref 3.5–5.1)
SODIUM: 141 mmol/L (ref 135–145)
TOTAL PROTEIN: 5.6 g/dL — AB (ref 6.5–8.1)

## 2015-10-18 LAB — URINALYSIS, ROUTINE W REFLEX MICROSCOPIC
GLUCOSE, UA: NEGATIVE mg/dL
HGB URINE DIPSTICK: NEGATIVE
Ketones, ur: 15 mg/dL — AB
Nitrite: NEGATIVE
Protein, ur: 100 mg/dL — AB
SPECIFIC GRAVITY, URINE: 1.026 (ref 1.005–1.030)
pH: 5 (ref 5.0–8.0)

## 2015-10-18 LAB — CBC WITH DIFFERENTIAL/PLATELET
BASOS ABS: 0 10*3/uL (ref 0.0–0.1)
Basophils Relative: 0 %
EOS ABS: 0 10*3/uL (ref 0.0–0.7)
EOS PCT: 0 %
HCT: 45.7 % (ref 39.0–52.0)
HEMOGLOBIN: 15 g/dL (ref 13.0–17.0)
LYMPHS ABS: 1 10*3/uL (ref 0.7–4.0)
Lymphocytes Relative: 6 %
MCH: 33.3 pg (ref 26.0–34.0)
MCHC: 32.8 g/dL (ref 30.0–36.0)
MCV: 101.3 fL — ABNORMAL HIGH (ref 78.0–100.0)
Monocytes Absolute: 1.9 10*3/uL — ABNORMAL HIGH (ref 0.1–1.0)
Monocytes Relative: 10 %
NEUTROS PCT: 84 %
Neutro Abs: 14.9 10*3/uL — ABNORMAL HIGH (ref 1.7–7.7)
PLATELETS: 196 10*3/uL (ref 150–400)
RBC: 4.51 MIL/uL (ref 4.22–5.81)
RDW: 13.2 % (ref 11.5–15.5)
WBC: 17.9 10*3/uL — AB (ref 4.0–10.5)

## 2015-10-18 LAB — I-STAT CHEM 8, ED
BUN: 58 mg/dL — ABNORMAL HIGH (ref 6–20)
CALCIUM ION: 1.08 mmol/L — AB (ref 1.13–1.30)
CREATININE: 2.1 mg/dL — AB (ref 0.61–1.24)
Chloride: 101 mmol/L (ref 101–111)
GLUCOSE: 161 mg/dL — AB (ref 65–99)
HCT: 46 % (ref 39.0–52.0)
HEMOGLOBIN: 15.6 g/dL (ref 13.0–17.0)
Potassium: 3 mmol/L — ABNORMAL LOW (ref 3.5–5.1)
Sodium: 140 mmol/L (ref 135–145)
TCO2: 18 mmol/L (ref 0–100)

## 2015-10-18 LAB — I-STAT CG4 LACTIC ACID, ED
LACTIC ACID, VENOUS: 14.75 mmol/L — AB (ref 0.5–2.0)
Lactic Acid, Venous: 7.54 mmol/L (ref 0.5–2.0)

## 2015-10-18 LAB — LACTIC ACID, PLASMA
Lactic Acid, Venous: 6.2 mmol/L (ref 0.5–2.0)
Lactic Acid, Venous: 4.1 mmol/L (ref 0.5–2.0)

## 2015-10-18 LAB — TYPE AND SCREEN
ABO/RH(D): O POS
Antibody Screen: NEGATIVE

## 2015-10-18 LAB — GLUCOSE, CAPILLARY: GLUCOSE-CAPILLARY: 113 mg/dL — AB (ref 65–99)

## 2015-10-18 LAB — URINE MICROSCOPIC-ADD ON

## 2015-10-18 LAB — TROPONIN I: TROPONIN I: 0.07 ng/mL — AB (ref <0.031)

## 2015-10-18 LAB — ABO/RH: ABO/RH(D): O POS

## 2015-10-18 LAB — PROCALCITONIN: PROCALCITONIN: 0.92 ng/mL

## 2015-10-18 LAB — POC OCCULT BLOOD, ED: FECAL OCCULT BLD: NEGATIVE

## 2015-10-18 LAB — MRSA PCR SCREENING: MRSA by PCR: NEGATIVE

## 2015-10-18 MED ORDER — VANCOMYCIN HCL IN DEXTROSE 1-5 GM/200ML-% IV SOLN
1000.0000 mg | Freq: Once | INTRAVENOUS | Status: AC
  Administered 2015-10-18: 1000 mg via INTRAVENOUS
  Filled 2015-10-18: qty 200

## 2015-10-18 MED ORDER — SODIUM CHLORIDE 0.9 % IV SOLN
INTRAVENOUS | Status: DC
  Administered 2015-10-18 – 2015-10-19 (×2): via INTRAVENOUS

## 2015-10-18 MED ORDER — SODIUM CHLORIDE 0.9 % IV BOLUS (SEPSIS)
1000.0000 mL | Freq: Once | INTRAVENOUS | Status: AC
  Administered 2015-10-18: 1000 mL via INTRAVENOUS

## 2015-10-18 MED ORDER — ONDANSETRON HCL 4 MG/2ML IJ SOLN
4.0000 mg | Freq: Four times a day (QID) | INTRAMUSCULAR | Status: DC | PRN
  Administered 2015-10-18: 4 mg via INTRAVENOUS
  Filled 2015-10-18: qty 2

## 2015-10-18 MED ORDER — ONDANSETRON HCL 4 MG/2ML IJ SOLN
4.0000 mg | Freq: Once | INTRAMUSCULAR | Status: AC
  Administered 2015-10-18: 4 mg via INTRAVENOUS
  Filled 2015-10-18: qty 2

## 2015-10-18 MED ORDER — CHLORHEXIDINE GLUCONATE 0.12 % MT SOLN
15.0000 mL | Freq: Two times a day (BID) | OROMUCOSAL | Status: DC
  Administered 2015-10-18 – 2015-10-22 (×8): 15 mL via OROMUCOSAL
  Filled 2015-10-18 (×4): qty 15

## 2015-10-18 MED ORDER — PANTOPRAZOLE SODIUM 40 MG IV SOLR
40.0000 mg | Freq: Every day | INTRAVENOUS | Status: DC
  Administered 2015-10-18 – 2015-10-21 (×4): 40 mg via INTRAVENOUS
  Filled 2015-10-18 (×4): qty 40

## 2015-10-18 MED ORDER — FENTANYL CITRATE (PF) 100 MCG/2ML IJ SOLN
25.0000 ug | INTRAMUSCULAR | Status: DC | PRN
  Administered 2015-10-18: 25 ug via INTRAVENOUS
  Filled 2015-10-18: qty 2

## 2015-10-18 MED ORDER — AMIODARONE HCL IN DEXTROSE 360-4.14 MG/200ML-% IV SOLN
60.0000 mg/h | Freq: Once | INTRAVENOUS | Status: AC
  Administered 2015-10-18: 60 mg/h via INTRAVENOUS
  Filled 2015-10-18: qty 200

## 2015-10-18 MED ORDER — SODIUM CHLORIDE 0.9 % IV BOLUS (SEPSIS)
2000.0000 mL | Freq: Once | INTRAVENOUS | Status: AC
  Administered 2015-10-18: 2000 mL via INTRAVENOUS

## 2015-10-18 MED ORDER — PIPERACILLIN-TAZOBACTAM 3.375 G IVPB 30 MIN
3.3750 g | Freq: Once | INTRAVENOUS | Status: AC
  Administered 2015-10-18: 3.375 g via INTRAVENOUS
  Filled 2015-10-18: qty 50

## 2015-10-18 MED ORDER — AMIODARONE IV BOLUS ONLY 150 MG/100ML
150.0000 mg | Freq: Once | INTRAVENOUS | Status: AC
  Administered 2015-10-18: 150 mg via INTRAVENOUS

## 2015-10-18 MED ORDER — BARIUM SULFATE 2.1 % PO SUSP
ORAL | Status: AC
  Filled 2015-10-18: qty 1

## 2015-10-18 MED ORDER — MORPHINE SULFATE (PF) 4 MG/ML IV SOLN
4.0000 mg | INTRAVENOUS | Status: DC | PRN
Start: 2015-10-18 — End: 2015-10-18

## 2015-10-18 MED ORDER — CETYLPYRIDINIUM CHLORIDE 0.05 % MT LIQD
7.0000 mL | Freq: Two times a day (BID) | OROMUCOSAL | Status: DC
  Administered 2015-10-19 – 2015-10-22 (×6): 7 mL via OROMUCOSAL

## 2015-10-18 MED ORDER — SODIUM CHLORIDE 0.9 % IV SOLN: 500.0000 mg | INTRAVENOUS | Status: DC

## 2015-10-18 MED ORDER — PIPERACILLIN-TAZOBACTAM 3.375 G IVPB
3.3750 g | Freq: Three times a day (TID) | INTRAVENOUS | Status: DC
  Administered 2015-10-18 – 2015-10-23 (×15): 3.375 g via INTRAVENOUS
  Filled 2015-10-18 (×18): qty 50

## 2015-10-18 NOTE — ED Notes (Signed)
CRITICAL VALUE ALERT  Critical value received:  Lactic acid Date of notification: 10/18/15  Time of notification:  1450  Critical value read back: yes  MD notified : Fayrene FearingJames, MD, Toniann FailWendy, RN Improved result

## 2015-10-18 NOTE — Progress Notes (Addendum)
ANTIBIOTIC CONSULT NOTE - INITIAL  Pharmacy Consult for vancomycin Indication: rule out pneumonia  No Known Allergies  Patient Measurements:   Adjusted Body Weight:   Vital Signs: Temp: 94.6 F (34.8 C) (12/18 1011) Temp Source: Rectal (12/18 1011) BP: 106/79 mmHg (12/18 1100) Pulse Rate: 132 (12/18 1102) Intake/Output from previous day:   Intake/Output from this shift:    Labs:  Recent Labs  10/18/15 1024 10/18/15 1037  WBC 17.9*  --   HGB 15.0 15.6  PLT 196  --   CREATININE  --  2.10*   CrCl cannot be calculated (Unknown ideal weight.). No results for input(s): VANCOTROUGH, VANCOPEAK, VANCORANDOM, GENTTROUGH, GENTPEAK, GENTRANDOM, TOBRATROUGH, TOBRAPEAK, TOBRARND, AMIKACINPEAK, AMIKACINTROU, AMIKACIN in the last 72 hours.   Microbiology: No results found for this or any previous visit (from the past 720 hour(s)).  Medical History: Past Medical History  Diagnosis Date  . Arthritis     Medications:  Anti-infectives    Start     Dose/Rate Route Frequency Ordered Stop   10/19/15 1200  vancomycin (VANCOCIN) 500 mg in sodium chloride 0.9 % 100 mL IVPB     500 mg 100 mL/hr over 60 Minutes Intravenous Every 24 hours 10/18/15 1111     10/18/15 1115  vancomycin (VANCOCIN) IVPB 1000 mg/200 mL premix     1,000 mg 200 mL/hr over 60 Minutes Intravenous  Once 10/18/15 1105     10/18/15 1100  piperacillin-tazobactam (ZOSYN) IVPB 3.375 g     3.375 g 100 mL/hr over 30 Minutes Intravenous  Once 10/18/15 1057       Assessment: 79 yom presented to the ED with abdominal pain. To start empiric vancomycin for sepsis. Also MD ordered a 1x dose of zosyn. Pt his hypothermic, WBC is elevated at 17.9 and lactic acid is 14.75. Scr is elevated at 2.1 (baseline <1).   Vanc 12/18>> Zosyn x 1 12/18  Goal of Therapy:  Vancomycin trough level 15-20 mcg/ml  Plan:  - Vancomycin 1gm IV x 1 then 500mg  IV Q24H - F/u renal fxn, C&S, clinical status [facility] - F/u continuation  of zosyn or other gram negative coverage  Vibha Ferdig, Drake Leachachel Lynn 10/18/2015,11:11 AM  Addendum: Stopping vancomycin and continued with zosyn alone.   Plan:  Zosyn 3.375gm IV Q8H (4 hr inf)  Lysle Pearlachel Artie Mcintyre, PharmD, BCPS Pager # [phone] 10/18/2015 1:14 PM

## 2015-10-18 NOTE — ED Notes (Signed)
Noted that patients arm was swollen below BP cuff. Told nurse and she advised me to move cuff to leg.

## 2015-10-18 NOTE — Consult Note (Signed)
Reason for Consult: sepsis abdominal pain Referring Physician: [name] is an 79 y.o. male.  HPI: 79 yo male with 4 days abdominal pain. He had vomiting 3 days ago and has not eaten anything since then. His last bowel movement was 5 days ago. He also complains of intractable hiccups. He has chronic constipation  Past Medical History  Diagnosis Date  . Arthritis     Past Surgical History  Procedure Laterality Date  . Hernia repair      left and right   . Left hip fracture surgery     . Tonsillectomy    . Circumcision    . Lumbar laminectomy/decompression microdiscectomy N/A 07/29/2015    Procedure: DECOMPRESSION L4-L5 and L3-L4,  MICRODISCECTOMY L4-L5     (2 LEVELS);  Surgeon: Susa Day, MD;  Location: [facility];  Service: Orthopedics;  Laterality: N/A;    Family History  Problem Relation Age of Onset  . Hypertension Other     Social History:  reports that he has never smoked. He does not have any smokeless tobacco history on file. He reports that he does not drink alcohol or use illicit drugs.  Allergies: No Known Allergies  Medications: I have reviewed the patient's current medications.  Results for orders placed or performed during the hospital encounter of 10/18/15 (from the past 48 hour(s))  POC occult blood, ED Provider will collect     Status: None   Collection Time: 10/18/15 10:14 AM  Result Value Ref Range   Fecal Occult Bld NEGATIVE NEGATIVE  CBC with Differential/Platelet     Status: Abnormal   Collection Time: 10/18/15 10:24 AM  Result Value Ref Range   WBC 17.9 (H) 4.0 - 10.5 K/uL   RBC 4.51 4.22 - 5.81 MIL/uL   Hemoglobin 15.0 13.0 - 17.0 g/dL   HCT 45.7 39.0 - 52.0 %   MCV 101.3 (H) 78.0 - 100.0 fL   MCH 33.3 26.0 - 34.0 pg   MCHC 32.8 30.0 - 36.0 g/dL   RDW 13.2 11.5 - 15.5 %   Platelets 196 150 - 400 K/uL   Neutrophils Relative % 84 %   Neutro Abs 14.9 (H) 1.7 - 7.7 K/uL   Lymphocytes Relative 6 %   Lymphs Abs 1.0 0.7 - 4.0  K/uL   Monocytes Relative 10 %   Monocytes Absolute 1.9 (H) 0.1 - 1.0 K/uL   Eosinophils Relative 0 %   Eosinophils Absolute 0.0 0.0 - 0.7 K/uL   Basophils Relative 0 %   Basophils Absolute 0.0 0.0 - 0.1 K/uL  Comprehensive metabolic panel     Status: Abnormal   Collection Time: 10/18/15 10:24 AM  Result Value Ref Range   Sodium 141 135 - 145 mmol/L   Potassium 3.1 (L) 3.5 - 5.1 mmol/L   Chloride 101 101 - 111 mmol/L   CO2 15 (L) 22 - 32 mmol/L   Glucose, Bld 167 (H) 65 - 99 mg/dL   BUN 56 (H) 6 - 20 mg/dL   Creatinine, Ser 2.58 (H) 0.61 - 1.24 mg/dL   Calcium 9.0 8.9 - 10.3 mg/dL   Total Protein 5.6 (L) 6.5 - 8.1 g/dL   Albumin 3.0 (L) 3.5 - 5.0 g/dL   AST 81 (H) 15 - 41 U/L   ALT 40 17 - 63 U/L   Alkaline Phosphatase 73 38 - 126 U/L   Total Bilirubin 1.6 (H) 0.3 - 1.2 mg/dL   GFR calc non Af Amer 22 (L) >60  mL/min   GFR calc Af Amer 26 (L) >60 mL/min    Comment: (NOTE) The eGFR has been calculated using the CKD EPI equation. This calculation has not been validated in all clinical situations. eGFR's persistently <60 mL/min signify possible Chronic Kidney Disease.    Anion gap 25 (H) 5 - 15  Troponin I     Status: Abnormal   Collection Time: 10/18/15 10:24 AM  Result Value Ref Range   Troponin I 0.07 (H) <0.031 ng/mL    Comment:        PERSISTENTLY INCREASED TROPONIN VALUES IN THE RANGE OF 0.04-0.49 ng/mL CAN BE SEEN IN:       -UNSTABLE ANGINA       -CONGESTIVE HEART FAILURE       -MYOCARDITIS       -CHEST TRAUMA       -ARRYHTHMIAS       -LATE PRESENTING MYOCARDIAL INFARCTION       -COPD   CLINICAL FOLLOW-UP RECOMMENDED.   Type and screen     Status: None   Collection Time: 10/18/15 10:25 AM  Result Value Ref Range   ABO/RH(D) O POS    Antibody Screen NEG    Sample Expiration 10/21/2015   ABO/Rh     Status: None   Collection Time: 10/18/15 10:25 AM  Result Value Ref Range   ABO/RH(D) O POS   I-stat troponin, ED     Status: None   Collection Time: 10/18/15  10:35 AM  Result Value Ref Range   Troponin i, poc 0.05 0.00 - 0.08 ng/mL   Comment 3            Comment: Due to the release kinetics of cTnI, a negative result within the first hours of the onset of symptoms does not rule out myocardial infarction with certainty. If myocardial infarction is still suspected, repeat the test at appropriate intervals.   I-stat chem 8, ed     Status: Abnormal   Collection Time: 10/18/15 10:37 AM  Result Value Ref Range   Sodium 140 135 - 145 mmol/L   Potassium 3.0 (L) 3.5 - 5.1 mmol/L   Chloride 101 101 - 111 mmol/L   BUN 58 (H) 6 - 20 mg/dL   Creatinine, Ser 2.10 (H) 0.61 - 1.24 mg/dL   Glucose, Bld 161 (H) 65 - 99 mg/dL   Calcium, Ion 1.08 (L) 1.13 - 1.30 mmol/L   TCO2 18 0 - 100 mmol/L   Hemoglobin 15.6 13.0 - 17.0 g/dL   HCT 46.0 39.0 - 52.0 %  I-Stat CG4 Lactic Acid, ED     Status: Abnormal   Collection Time: 10/18/15 10:37 AM  Result Value Ref Range   Lactic Acid, Venous 14.75 (HH) 0.5 - 2.0 mmol/L   Comment NOTIFIED PHYSICIAN   Urinalysis, Routine w reflex microscopic (not at [facility])     Status: Abnormal   Collection Time: 10/18/15 12:30 PM  Result Value Ref Range   Color, Urine ORANGE (A) YELLOW    Comment: BIOCHEMICALS MAY BE AFFECTED BY COLOR   APPearance TURBID (A) CLEAR   Specific Gravity, Urine 1.026 1.005 - 1.030   pH 5.0 5.0 - 8.0   Glucose, UA NEGATIVE NEGATIVE mg/dL   Hgb urine dipstick NEGATIVE NEGATIVE   Bilirubin Urine SMALL (A) NEGATIVE   Ketones, ur 15 (A) NEGATIVE mg/dL   Protein, ur 100 (A) NEGATIVE mg/dL   Nitrite NEGATIVE NEGATIVE   Leukocytes, UA TRACE (A) NEGATIVE  Urine microscopic-add on  Status: Abnormal   Collection Time: 10/18/15 12:30 PM  Result Value Ref Range   Squamous Epithelial / LPF 0-5 (A) NONE SEEN   WBC, UA 0-5 0 - 5 WBC/hpf   RBC / HPF 0-5 0 - 5 RBC/hpf   Bacteria, UA FEW (A) NONE SEEN   Casts HYALINE CASTS (A) NEGATIVE   Urine-Other AMORPHOUS URATES/PHOSPHATES   I-Stat CG4 Lactic Acid, ED      Status: Abnormal   Collection Time: 10/18/15 12:56 PM  Result Value Ref Range   Lactic Acid, Venous 7.54 (HH) 0.5 - 2.0 mmol/L   Comment NOTIFIED PHYSICIAN     Dg Chest Port 1 View  10/18/2015  CLINICAL DATA:  Pain and nausea EXAM: PORTABLE CHEST - 1 VIEW COMPARISON:  07/31/2015 FINDINGS: Cardiac shadow is within normal limits. The lungs are well aerated bilaterally. No focal infiltrate or sizable effusion is seen. No bony abnormality is noted. IMPRESSION: No active disease. Electronically Signed   By: Inez Catalina M.D.   On: 10/18/2015 11:02    Review of Systems  Constitutional: Negative for fever and chills.  HENT: Negative for hearing loss.   Eyes: Negative for blurred vision and double vision.  Respiratory: Negative for cough and hemoptysis.   Cardiovascular: Negative for chest pain and palpitations.  Gastrointestinal: Positive for vomiting, abdominal pain and constipation. Negative for nausea.  Genitourinary: Negative for dysuria and urgency.  Musculoskeletal: Negative for myalgias and neck pain.  Skin: Negative for itching and rash.  Neurological: Negative for dizziness, tingling and headaches.  Endo/Heme/Allergies: Does not bruise/bleed easily.  Psychiatric/Behavioral: Negative for depression and suicidal ideas.   Blood pressure 140/89, pulse 114, temperature 96.7 F (35.9 C), temperature source Rectal, resp. rate 21, SpO2 98 %. Physical Exam  Constitutional: He is oriented to person, place, and time. He appears well-developed and well-nourished.  HENT:  Head: Normocephalic and atraumatic.  Eyes: Conjunctivae are normal. Pupils are equal, round, and reactive to light.  Neck: Normal range of motion. Neck supple.  Cardiovascular: Normal rate and regular rhythm.   Respiratory: Breath sounds normal.  GI: Soft. He exhibits distension. He exhibits no mass. There is tenderness. There is no rebound.  Well healed midline scar and bilateral groin scars without palpable hernia  defect.  Musculoskeletal: He exhibits no edema or tenderness.  Neurological: He is alert and oriented to person, place, and time.  Skin: Skin is warm and dry.  Psychiatric: He has a normal mood and affect. His behavior is normal.    Assessment/Plan: 79 yo male with history of sigmoidectomy and bilateral inguinal hernia repairs. On CT scan he appears to have some dilation with transition point in mid abdomen. His lactate has improved with fluids and there does not appear to be a cloosed loop obstruction or twisting of mesentary. -place NG tube -agree with empiric antibiotics -may need additional rehydration  Arta Bruce Kinsinger 10/18/2015, 1:48 PM

## 2015-10-18 NOTE — ED Provider Notes (Addendum)
CSN: [id number]     Arrival date & time 10/18/15  1478 History   First MD Initiated Contact with Patient 10/18/15 1002     Chief Complaint  Patient presents with  . Abdominal Pain      HPI  Patient presents via over-the-counter EMS from home. History of back surgery 3 months ago. Over last 3-4 days had severe abdominal pain with no nausea vomiting or diarrhea. Was tachycardic en route. Was given amiodarone and fluids en route by paramedics for tachycardia and hypotension.  No emesis. Last bowel movement 2 days ago. Feels nauseated. No fevers or chills. No chest pain. No neurological symptoms with numbness or weakness.  Past Medical History  Diagnosis Date  . Arthritis    Past Surgical History  Procedure Laterality Date  . Hernia repair      left and right   . Left hip fracture surgery     . Tonsillectomy    . Circumcision    . Lumbar laminectomy/decompression microdiscectomy N/A 07/29/2015    Procedure: DECOMPRESSION L4-L5 and L3-L4,  MICRODISCECTOMY L4-L5     (2 LEVELS);  Surgeon: Jene Every, MD;  Location: [facility];  Service: Orthopedics;  Laterality: N/A;   Family History  Problem Relation Age of Onset  . Hypertension Other    Social History  Substance Use Topics  . Smoking status: Never Smoker   . Smokeless tobacco: Not on file  . Alcohol Use: No    Review of Systems  Constitutional: Negative for fever, chills, diaphoresis, appetite change and fatigue.  HENT: Negative for mouth sores, sore throat and trouble swallowing.   Eyes: Negative for visual disturbance.  Respiratory: Negative for cough, chest tightness, shortness of breath and wheezing.   Cardiovascular: Negative for chest pain.  Gastrointestinal: Positive for abdominal pain, constipation and abdominal distention. Negative for nausea, vomiting and diarrhea.  Endocrine: Negative for polydipsia, polyphagia and polyuria.  Genitourinary: Negative for dysuria, frequency and hematuria.  Musculoskeletal: Negative  for gait problem.  Skin: Negative for color change, pallor and rash.  Neurological: Positive for weakness. Negative for dizziness, syncope, light-headedness and headaches.  Hematological: Does not bruise/bleed easily.  Psychiatric/Behavioral: Negative for behavioral problems and confusion.      Allergies  Review of patient's allergies indicates no known allergies.  Home Medications   Prior to Admission medications   Medication Sig Start Date End Date Taking? Authorizing Provider  ascorbic acid (VITAMIN C) 500 MG tablet Take 500 mg by mouth daily.   Yes Historical Provider, MD  calcium-vitamin D (OSCAL WITH D) 500-200 MG-UNIT per tablet Take 1 tablet by mouth daily with breakfast.   Yes Historical Provider, MD  Cyanocobalamin (VITAMIN B 12 PO) Take 1 tablet by mouth daily.   Yes Historical Provider, MD  FOLIC ACID PO Take 1 tablet by mouth daily.   Yes Historical Provider, MD  Ibuprofen-Diphenhydramine HCl (ADVIL PM) 200-25 MG CAPS Take 1 tablet by mouth daily as needed (pain).   Yes Historical Provider, MD  MAGNESIUM PO Take 1 tablet by mouth daily.   Yes Historical Provider, MD  Multiple Vitamin (MULTIVITAMIN WITH MINERALS) TABS tablet Take 1 tablet by mouth daily.   Yes Historical Provider, MD  naproxen sodium (ANAPROX) 220 MG tablet Take 220 mg by mouth 2 (two) times daily as needed (pain).   Yes Historical Provider, MD  docusate sodium (COLACE) 100 MG capsule Take 1 capsule (100 mg total) by mouth 2 (two) times daily as needed for mild constipation. Patient not taking: Reported  on 10/18/2015 07/29/15   Jene Every, MD  HYDROcodone-acetaminophen (NORCO/VICODIN) 5-325 MG tablet Take 1 tablet by mouth every 4 (four) hours as needed. Patient not taking: Reported on 10/18/2015 07/29/15   Jene Every, MD  tamsulosin (FLOMAX) 0.4 MG CAPS capsule Take 1 capsule (0.4 mg total) by mouth daily after supper. Patient not taking: Reported on 10/18/2015 07/31/15   Dayna Barker Bissell, PA-C   BP  140/89 mmHg  Pulse 114  Temp(Src) 96.7 F (35.9 C) (Rectal)  Resp 21  SpO2 98% Physical Exam  Constitutional: He is oriented to person, place, and time. He appears well-developed and well-nourished. He appears listless. He appears ill. No distress.  HENT:  Head: Normocephalic.  Eyes: Conjunctivae are normal. Pupils are equal, round, and reactive to light. No scleral icterus.  Neck: Normal range of motion. Neck supple. No thyromegaly present.  Cardiovascular: Normal rate and regular rhythm.  Exam reveals no gallop and no friction rub.   No murmur heard. Tachycardia 140. Appears to be 2-1 atrial flutter  Pulmonary/Chest: Effort normal and breath sounds normal. No respiratory distress. He has no wheezes. He has no rales.  Abdominal: Soft. Bowel sounds are normal. He exhibits distension. There is generalized tenderness. There is no rebound.  Soft distention of the abdomen. Not rigid. Mild diffuse tenderness without guarding. Rectal is guaiac negative brown stool.  Musculoskeletal: Normal range of motion.  Neurological: He is oriented to person, place, and time. He appears listless.  Skin: Skin is warm and dry. No rash noted.  Psychiatric: He has a normal mood and affect. His behavior is normal.    ED Course  Procedures (including critical care time) Labs Review Labs Reviewed  CBC WITH DIFFERENTIAL/PLATELET - Abnormal; Notable for the following:    WBC 17.9 (*)    MCV 101.3 (*)    Neutro Abs 14.9 (*)    Monocytes Absolute 1.9 (*)    All other components within normal limits  COMPREHENSIVE METABOLIC PANEL - Abnormal; Notable for the following:    Potassium 3.1 (*)    CO2 15 (*)    Glucose, Bld 167 (*)    BUN 56 (*)    Creatinine, Ser 2.58 (*)    Total Protein 5.6 (*)    Albumin 3.0 (*)    AST 81 (*)    Total Bilirubin 1.6 (*)    GFR calc non Af Amer 22 (*)    GFR calc Af Amer 26 (*)    Anion gap 25 (*)    All other components within normal limits  TROPONIN I - Abnormal;  Notable for the following:    Troponin I 0.07 (*)    All other components within normal limits  URINALYSIS, ROUTINE W REFLEX MICROSCOPIC (NOT AT [facility]) - Abnormal; Notable for the following:    Color, Urine ORANGE (*)    APPearance TURBID (*)    Bilirubin Urine SMALL (*)    Ketones, ur 15 (*)    Protein, ur 100 (*)    Leukocytes, UA TRACE (*)    All other components within normal limits  URINE MICROSCOPIC-ADD ON - Abnormal; Notable for the following:    Squamous Epithelial / LPF 0-5 (*)    Bacteria, UA FEW (*)    Casts HYALINE CASTS (*)    All other components within normal limits  I-STAT CHEM 8, ED - Abnormal; Notable for the following:    Potassium 3.0 (*)    BUN 58 (*)    Creatinine, Ser 2.10 (*)  Glucose, Bld 161 (*)    Calcium, Ion 1.08 (*)    All other components within normal limits  I-STAT CG4 LACTIC ACID, ED - Abnormal; Notable for the following:    Lactic Acid, Venous 14.75 (*)    All other components within normal limits  I-STAT CG4 LACTIC ACID, ED - Abnormal; Notable for the following:    Lactic Acid, Venous 7.54 (*)    All other components within normal limits  CULTURE, BLOOD (ROUTINE X 2)  CULTURE, BLOOD (ROUTINE X 2)  PROCALCITONIN  LACTIC ACID, PLASMA  LACTIC ACID, PLASMA  I-STAT TROPOININ, ED  POC OCCULT BLOOD, ED  TYPE AND SCREEN  ABO/RH    Imaging Review Ct Abdomen Pelvis Wo Contrast  10/18/2015  CLINICAL DATA:  Lower abdominal pain, elevated lactic acidosis, sepsis EXAM: CT ABDOMEN AND PELVIS WITHOUT CONTRAST TECHNIQUE: Multidetector CT imaging of the abdomen and pelvis was performed following the standard protocol without IV contrast. Sagittal and coronal MPR images reconstructed from axial data set. Oral contrast not administered. COMPARISON:  None. FINDINGS: Dependent atelectasis LEFT lower lobe. Scattered atherosclerotic calcification aorta and coronary arteries. Beam hardening artifacts traverse upper abdomen secondary to patient's arms within  imaged field. BILATERAL renal cysts. Numerous gallstones within a potential porcelain gallbladder. Slightly irregular/ nodular hepatic margins raising question of cirrhosis. No focal mass lesions of the liver, spleen, pancreas, or adrenal glands. No hydronephrosis or ureteral dilatation. Stomach distended small hiatal hernia. Significant dilatation of proximal small bowel loops with decompressed distal small bowel loops compatible with obstruction. Several of the dilated small bowel loops proximally show wall thickening, with significant thickening of a single small bowel loop in the mid abdomen, raising question of small bowel ischemia. On coronal images, a suggestion of swirling of the mesentery is seen though this is not definitely confirmed on the axial images. Significant mesenteric edema. Distal small bowel and colon decompressed without wall thickening. Scattered ascites. Decompressed urinary bladder with minimal prostatic enlargement. No mass, adenopathy, or free air. Bones demineralized with pins at proximal RIGHT femur. Anterior compression deformity of T12 with Schmorl's node formation, appears old. Degenerative disc disease changes thoracolumbar spine. IMPRESSION: Dilated stomach and proximal small bowel loops with decompressed distal small bowel and colon compatible with small bowel obstruction. Significant bowel wall thickening of small bowel loops in the mid abdomen with mesenteric edema suspicious for ischemic small bowel. Suggestion of swirling of the mesentery on coronal images seen is seen which can be seen with internal hernia or small bowel volvulus though this is not seen on axial images. Findings called to Dr. Fayrene FearingJames on 10/18/2015 at 1352 hours. Electronically Signed   By: Ulyses SouthwardMark  Boles M.D.   On: 10/18/2015 13:54   Dg Chest Port 1 View  10/18/2015  CLINICAL DATA:  Pain and nausea EXAM: PORTABLE CHEST - 1 VIEW COMPARISON:  07/31/2015 FINDINGS: Cardiac shadow is within normal limits. The lungs  are well aerated bilaterally. No focal infiltrate or sizable effusion is seen. No bony abnormality is noted. IMPRESSION: No active disease. Electronically Signed   By: Alcide CleverMark  Lukens M.D.   On: 10/18/2015 11:02   I have personally reviewed and evaluated these images and lab results as part of my medical decision-making.   EKG Interpretation   Date/Time:  Sunday October 18 2015 10:01:45 EST Ventricular Rate:  144 PR Interval:    QRS Duration: 135 QT Interval:  339 QTC Calculation: 525 R Axis:   -84 Text Interpretation:  Wide-QRS tachycardia Probable A flutter 2:1 AVB  RBBB  and LAFB Baseline wander in lead(s) V5 V6 Confirmed by Fayrene Fearing  MD, Charlotta Lapaglia  [phone]) on 10/18/2015 10:05:45 AM      MDM   Final diagnoses:  Abdominal pain   Upon arrival patient had pressure of 90. Was tachycardic at 140. Wide complex regular, monitor most consistent with atrial flutter and 21 AV block. Mentating well. IVs established. Given IV fluid boluses. Had been given 75 mg amiodarone bolus by paramedics without change in rate, rhythm.  Her 2 L fluid still had tachycardia. Initial lactate 14. Initial fluid bolus initiated. Code sepsis initiated. Given amiodarone 150 mg bolus and drip initiated by me. Had conversion to complete third-degree AV block. Maintained pressures in mental status. Amiodarone was stopped and fluids continued his rate has improved.  Seen by critical care. CT scan abdomen pain without contrast. Shows small bowel obstruction, possible small bowel illness. Thickened bowel with concern for possible ischemic bowel considering high lactic acid. Is guaiac negative. Surgical service consulted by intensivists.    CRITICAL CARE Performed by: Rolland Porter JOSEPH   Total critical care time: 60 minutes  Critical care time was exclusive of separately billable procedures and treating other patients.  Critical care was necessary to treat or prevent imminent or life-threatening deterioration.  Critical  care was time spent personally by me on the following activities: development of treatment plan with patient and/or surrogate as well as nursing, discussions with consultants, evaluation of patient's response to treatment, examination of patient, obtaining history from patient or surrogate, ordering and performing treatments and interventions, ordering and review of laboratory studies, ordering and review of radiographic studies, pulse oximetry and re-evaluation of patient's condition. care    Rolland Porter, MD 10/18/15 1414  Rolland Porter, MD 11/19/15 [phone]

## 2015-10-18 NOTE — H&P (Signed)
PULMONARY / CRITICAL CARE MEDICINE   Name: Derek Bentley MRN: [id number] DOB: 03/03/1936    ADMISSION DATE:  10/18/2015  REFERRING MD:  EDP  CHIEF COMPLAINT:  abd pain, lactic acidosis   HISTORY OF PRESENT ILLNESS:   79yo male with hx arthritis, hx diverticulitis, recent lumbar spine surgery presents 12/18 with 5 day hx abd pain, vomiting with intermittent coffee ground emesis. Unable to take PO's for several days.  Denies fever, SOB, rectal bleeding, tarry stools.  Does take Advil PM 3-4 x per week.  In ER found to have lactic acid 14, SBP 80's. Initially thought to be in A Flutter and received amiodarone bolus but had subsequent bradycardia and this was stopped.  PCCM called for ICU admission.    PAST MEDICAL HISTORY :  He  has a past medical history of Arthritis.  PAST SURGICAL HISTORY: He  has past surgical history that includes Hernia repair; left hip fracture surgery ; Tonsillectomy; Circumcision; and Lumbar laminectomy/decompression microdiscectomy (N/A, 07/29/2015).  No Known Allergies  No current facility-administered medications on file prior to encounter.   Current Outpatient Prescriptions on File Prior to Encounter  Medication Sig  . ascorbic acid (VITAMIN C) 500 MG tablet Take 500 mg by mouth daily.  . calcium-vitamin D (OSCAL WITH D) 500-200 MG-UNIT per tablet Take 1 tablet by mouth daily with breakfast.  . Cyanocobalamin (VITAMIN B 12 PO) Take 1 tablet by mouth daily.  Marland Kitchen. FOLIC ACID PO Take 1 tablet by mouth daily.  Marland Kitchen. MAGNESIUM PO Take 1 tablet by mouth daily.  . Multiple Vitamin (MULTIVITAMIN WITH MINERALS) TABS tablet Take 1 tablet by mouth daily.  Marland Kitchen. docusate sodium (COLACE) 100 MG capsule Take 1 capsule (100 mg total) by mouth 2 (two) times daily as needed for mild constipation. (Patient not taking: Reported on 10/18/2015)  . HYDROcodone-acetaminophen (NORCO/VICODIN) 5-325 MG tablet Take 1 tablet by mouth every 4 (four) hours as needed. (Patient not taking: Reported  on 10/18/2015)  . tamsulosin (FLOMAX) 0.4 MG CAPS capsule Take 1 capsule (0.4 mg total) by mouth daily after supper. (Patient not taking: Reported on 10/18/2015)    FAMILY HISTORY:  His indicated that his mother is deceased. He indicated that his father is deceased.   No known family hx cirrhosis.   SOCIAL HISTORY: He  reports that he has never smoked. He does not have any smokeless tobacco history on file. He reports that he does not drink alcohol or use illicit drugs.  REVIEW OF SYSTEMS:   As per HPI - All other systems reviewed and were neg.   SUBJECTIVE:    VITAL SIGNS: BP 115/54 mmHg  Pulse 109  Temp(Src) 94.6 F (34.8 C) (Rectal)  Resp 18  SpO2 96%  HEMODYNAMICS:    VENTILATOR SETTINGS:    INTAKE / OUTPUT:    PHYSICAL EXAMINATION: General:  Pleasant male, uncomfortable appearing  Neuro:  Awake, alert, appropriate, MAE  HEENT:  Mm dry, no JVD  Cardiovascular:  s1s2 rrr Lungs:  resps even non labored on RA, clear  Abdomen:  Distended, tender diffusely but L>R, no r/g Musculoskeletal:  Warm and dry, pale, no edema   LABS:  BMET  Recent Labs Lab 10/18/15 1024 10/18/15 1037  NA 141 140  K 3.1* 3.0*  CL 101 101  CO2 15*  --   BUN 56* 58*  CREATININE 2.58* 2.10*  GLUCOSE 167* 161*    Electrolytes  Recent Labs Lab 10/18/15 1024  CALCIUM 9.0    CBC  Recent  Labs Lab 10/18/15 1024 10/18/15 1037  WBC 17.9*  --   HGB 15.0 15.6  HCT 45.7 46.0  PLT 196  --     Coag's No results for input(s): APTT, INR in the last 168 hours.  Sepsis Markers  Recent Labs Lab 10/18/15 1037  LATICACIDVEN 14.75*    ABG No results for input(s): PHART, PCO2ART, PO2ART in the last 168 hours.  Liver Enzymes  Recent Labs Lab 10/18/15 1024  AST 81*  ALT 40  ALKPHOS 73  BILITOT 1.6*  ALBUMIN 3.0*    Cardiac Enzymes  Recent Labs Lab 10/18/15 1024  TROPONINI 0.07*    Glucose No results for input(s): GLUCAP in the last 168  hours.  Imaging Dg Chest Port 1 View  10/18/2015  CLINICAL DATA:  Pain and nausea EXAM: PORTABLE CHEST - 1 VIEW COMPARISON:  07/31/2015 FINDINGS: Cardiac shadow is within normal limits. The lungs are well aerated bilaterally. No focal infiltrate or sizable effusion is seen. No bony abnormality is noted. IMPRESSION: No active disease. Electronically Signed   By: Alcide Clever M.D.   On: 10/18/2015 11:02     STUDIES:  CT abd/pelvis 12/18>>>  CULTURES:   ANTIBIOTICS: Zosyn 12/18>>>  SIGNIFICANT EVENTS:   LINES/TUBES:   DISCUSSION: 79yo male presenting with 5 day hx abd pain, lactic acidosis, hypotension.    ASSESSMENT / PLAN:  PULMONARY No active issue  P:   Supplemental O2 as needed   CARDIOVASCULAR Hypotension, In setting A Fib Sepsis  A fib with Tachycardia  Bradycardia - after amiodarone admin  P:  volume resuscitation  D/c amiodarone at this point  RENAL AKI - r/t sepsis + NSAIDS P:   F/u chem  Volume as above  No contrast for CT   GASTROINTESTINAL Abd pain  Nausea - ?coffee ground emesis Poor PO intake P:   CT abd pending  Consider GI v surgery depending on CT results  PPI  NPO for now   HEMATOLOGIC ?coffee ground emesis -  P:  Trend CBC  SCD's   INFECTIOUS Sepsis - presumed abd source  P:   Empiric zosyn  D/c vanc for now  Trend pct, lactate   ENDOCRINE No active issue  P:   Monitor glucose on chem   NEUROLOGIC Back pain  P:   Hold NSAIDS  PRN fentanyl    FAMILY  - Updates:   Pt, wife updated at bedside 12/18.    Dirk Dress, NP 10/18/2015  12:54 PM Pager: [phone] or [phone]   Attending Note:  I have examined patient, reviewed labs, studies and notes. I have discussed the case with Derek Bentley, and I agree with the data and plans as amended above. Pt with hx chronic constipation, 5 days of abd pain. Presented with pain + tachycardia and hemodynamic instability with A Fib + RVR and hypotension. Both  are now improved with volume. Significant lactic acidosis. On my exam he has no bowel sounds, is very tender on the L but no rebound. CT abdomen without contrast is pending. I believe we need to have CCS evaluate him. In meantime we will continue his volume resuscitation and treat with empiric abx. Admit to ICU. Independent critical care time is 50 minutes.   Levy Pupa, MD, PhD 10/18/2015, 2:06 PM [facility] [phone] or if no answer [phone]

## 2015-10-18 NOTE — Progress Notes (Signed)
NG tube finally placed with 400cc out and 800cc in vomit. Abdomen now without guarding and much less tender. No UOP since admission -XR to follow up placement -1l NS bolus  Derek RossettiLuke Kaitlynd Phillips, M.D. [facility] Pg: [phone]

## 2015-10-18 NOTE — ED Notes (Signed)
Per Pharmacy the area is to have a cold compress to the infiltrated IV site, per pharmacy the medication is not a vesicant

## 2015-10-18 NOTE — ED Notes (Signed)
Pt in from home via [facility], per report pt had a back sx x 3 mths ago, pt reports new onset abd pain over the last 3-4 days ago, pt denies n/v/d, pt rcvd Amiodarone 75 mg & 500 mL NS pta, pt A&O x4. Follows commands, speaks in complete setences, denies CP & SOB

## 2015-10-18 NOTE — ED Notes (Signed)
James,MD aware of pts infiltrated IV site & that Pharmacist was notified

## 2015-10-18 NOTE — ED Notes (Signed)
Attempted report 

## 2015-10-18 NOTE — ED Notes (Signed)
Upon arrival to floor this RN noticed the pt had 2 inch x 2 inch contusion to the R upper forearm above infiltration site, this RN had a Pharmacist look at the site, floor RN Tyesha to contact admitting to inform re: the skin assessment, all skin intact

## 2015-10-18 NOTE — ED Notes (Signed)
Wife at bedside.

## 2015-10-18 NOTE — ED Notes (Signed)
Zoll pads placed on pt.

## 2015-10-18 NOTE — Progress Notes (Signed)
NG tube appears in good position on XR Lactate continues to downtrend -will maintain nonoperative management unless clinical course worsens  Feliciana RossettiLuke Jeanita Carneiro, M.D. [facility] Pg: [phone]

## 2015-10-18 NOTE — ED Notes (Signed)
Per Fayrene FearingJames, MD pt to go to CT abd without drinking the contrast

## 2015-10-19 ENCOUNTER — Inpatient Hospital Stay (HOSPITAL_COMMUNITY): Payer: Medicare Other

## 2015-10-19 DIAGNOSIS — R1010 Upper abdominal pain, unspecified: Secondary | ICD-10-CM

## 2015-10-19 DIAGNOSIS — K5669 Other intestinal obstruction: Secondary | ICD-10-CM

## 2015-10-19 DIAGNOSIS — E872 Acidosis: Secondary | ICD-10-CM

## 2015-10-19 DIAGNOSIS — N320 Bladder-neck obstruction: Secondary | ICD-10-CM | POA: Insufficient documentation

## 2015-10-19 DIAGNOSIS — K56609 Unspecified intestinal obstruction, unspecified as to partial versus complete obstruction: Secondary | ICD-10-CM | POA: Insufficient documentation

## 2015-10-19 LAB — BASIC METABOLIC PANEL
Anion gap: 12 (ref 5–15)
BUN: 68 mg/dL — AB (ref 6–20)
CO2: 23 mmol/L (ref 22–32)
CREATININE: 2.42 mg/dL — AB (ref 0.61–1.24)
Calcium: 7.8 mg/dL — ABNORMAL LOW (ref 8.9–10.3)
Chloride: 107 mmol/L (ref 101–111)
GFR calc Af Amer: 28 mL/min — ABNORMAL LOW (ref >60)
GFR, EST NON AFRICAN AMERICAN: 24 mL/min — AB (ref >60)
Glucose, Bld: 81 mg/dL (ref 65–99)
POTASSIUM: 4 mmol/L (ref 3.5–5.1)
SODIUM: 142 mmol/L (ref 135–145)

## 2015-10-19 LAB — PROCALCITONIN: PROCALCITONIN: 3.28 ng/mL

## 2015-10-19 LAB — CBC
HCT: 39.3 % (ref 39.0–52.0)
Hemoglobin: 14 g/dL (ref 13.0–17.0)
MCH: 34.7 pg — AB (ref 26.0–34.0)
MCHC: 35.6 g/dL (ref 30.0–36.0)
MCV: 97.5 fL (ref 78.0–100.0)
PLATELETS: 145 10*3/uL — AB (ref 150–400)
RBC: 4.03 MIL/uL — AB (ref 4.22–5.81)
RDW: 13.4 % (ref 11.5–15.5)
WBC: 18 10*3/uL — ABNORMAL HIGH (ref 4.0–10.5)

## 2015-10-19 MED ORDER — HEPARIN SODIUM (PORCINE) 5000 UNIT/ML IJ SOLN
5000.0000 [IU] | Freq: Three times a day (TID) | INTRAMUSCULAR | Status: DC
  Administered 2015-10-19 – 2015-10-23 (×12): 5000 [IU] via SUBCUTANEOUS
  Filled 2015-10-19 (×10): qty 1

## 2015-10-19 NOTE — Progress Notes (Signed)
Subjective: Derek Bentley is working, he feels much better, a little sore on palpation, but better than when he came in.  I didn't hear bowel sounds.  His colon surgery was about 19 years ago.  He would really like some ice chips.  He vomited a large amount yesterday and was incontinent during that period, no foley.    Objective: Vital signs in last 24 hours: Temp:  [94.6 F (34.8 C)-98 F (36.7 C)] 97.4 F (36.3 C) (12/19 0803) Pulse Rate:  [40-132] 117 (12/19 0700) Resp:  [14-30] 25 (12/19 0700) BP: (82-149)/(46-100) 106/67 mmHg (12/19 0700) SpO2:  [79 %-100 %] 98 % (12/19 0700) Weight:  [63.2 kg (139 lb 5.3 oz)-64.3 kg (141 lb 12.1 oz)] 64.3 kg (141 lb 12.1 oz) (12/19 0426) Last BM Date: 10/13/15 5.2 liters of fluid recorded 100 ml of urine recorded Emesis 1156 recorded Afebrile, tachycardic, BP in the 90-100's range  (ST, off amiodarone this AM) Creatinine is up  WBC is still up, No lactate this AM Intake/Output from previous day: 12/18 0701 - 12/19 0700 In: 5211.3 [I.V.:4111.3; IV Piggyback:1100] Out: 1256 [Urine:100; Emesis/Derek Bentley output:1156] Intake/Output this shift:    General appearance: alert, cooperative and no distress GI: soft, minimally tender, I didn't hear much in the was of BS.  Well healed midline scar.  Lab Results:   Recent Labs  10/18/15 1024 10/18/15 1037 10/19/15 0228  WBC 17.9*  --  18.0*  HGB 15.0 15.6 14.0  HCT 45.7 46.0 39.3  PLT 196  --  145*    BMET  Recent Labs  10/18/15 1024 10/18/15 1037 10/19/15 0228  NA 141 140 142  K 3.1* 3.0* 4.0  CL 101 101 107  CO2 15*  --  23  GLUCOSE 167* 161* 81  BUN 56* 58* 68*  CREATININE 2.58* 2.10* 2.42*  CALCIUM 9.0  --  7.8*   PT/INR No results for input(s): LABPROT, INR in the last 72 hours.   Recent Labs Lab 10/18/15 1024  AST 81*  ALT 40  ALKPHOS 73  BILITOT 1.6*  PROT 5.6*  ALBUMIN 3.0*     Lipase  No results found for: LIPASE   Studies/Results: Ct Abdomen Pelvis Wo  Contrast  10/18/2015  CLINICAL DATA:  Lower abdominal pain, elevated lactic acidosis, sepsis EXAM: CT ABDOMEN AND PELVIS WITHOUT CONTRAST TECHNIQUE: Multidetector CT imaging of the abdomen and pelvis was performed following the standard protocol without IV contrast. Sagittal and coronal MPR images reconstructed from axial data set. Oral contrast not administered. COMPARISON:  None. FINDINGS: Dependent atelectasis LEFT lower lobe. Scattered atherosclerotic calcification aorta and coronary arteries. Beam hardening artifacts traverse upper abdomen secondary to patient's arms within imaged field. BILATERAL renal cysts. Numerous gallstones within a potential porcelain gallbladder. Slightly irregular/ nodular hepatic margins raising question of cirrhosis. No focal mass lesions of the liver, spleen, pancreas, or adrenal glands. No hydronephrosis or ureteral dilatation. Stomach distended small hiatal hernia. Significant dilatation of proximal small bowel loops with decompressed distal small bowel loops compatible with obstruction. Several of the dilated small bowel loops proximally show wall thickening, with significant thickening of a single small bowel loop in the mid abdomen, raising question of small bowel ischemia. On coronal images, a suggestion of swirling of the mesentery is seen though this is not definitely confirmed on the axial images. Significant mesenteric edema. Distal small bowel and colon decompressed without wall thickening. Scattered ascites. Decompressed urinary bladder with minimal prostatic enlargement. No mass, adenopathy, or free air. Bones demineralized with pins  at proximal RIGHT femur. Anterior compression deformity of T12 with Schmorl's node formation, appears old. Degenerative disc disease changes thoracolumbar spine. IMPRESSION: Dilated stomach and proximal small bowel loops with decompressed distal small bowel and colon compatible with small bowel obstruction. Significant bowel wall  thickening of small bowel loops in the mid abdomen with mesenteric edema suspicious for ischemic small bowel. Suggestion of swirling of the mesentery on coronal images seen is seen which can be seen with internal hernia or small bowel volvulus though this is not seen on axial images. Findings called to Dr. Fayrene Fearing on 10/18/2015 at 1352 hours. Electronically Signed   By: Ulyses Southward M.D.   On: 10/18/2015 13:54   Dg Chest Port 1 View  10/18/2015  CLINICAL DATA:  Pain and nausea EXAM: PORTABLE CHEST - 1 VIEW COMPARISON:  07/31/2015 FINDINGS: Cardiac shadow is within normal limits. The lungs are well aerated bilaterally. No focal infiltrate or sizable effusion is seen. No bony abnormality is noted. IMPRESSION: No active disease. Electronically Signed   By: Alcide Clever M.D.   On: 10/18/2015 11:02   Dg Abd Portable 1v  10/18/2015  CLINICAL DATA:  Derek Bentley tube placement. EXAM: PORTABLE ABDOMEN - 1 VIEW COMPARISON:  CT, 10/18/2015 at 1303 hours FINDINGS: Nasogastric tube passes below the diaphragm well into the stomach. There is little bowel air on the supine exam. No radiographic evidence of obstruction. Dilated small bowel loops seen on earlier CT scan may not be visible due to the supine technique lack of air. Soft tissues are unremarkable. IMPRESSION: Nasogastric tube well positioned with its tip in the mid stomach. Electronically Signed   By: Amie Portland M.D.   On: 10/18/2015 17:37    Medications: . antiseptic oral rinse  7 mL Mouth Rinse q12n4p  . chlorhexidine  15 mL Mouth Rinse BID  . pantoprazole (PROTONIX) IV  40 mg Intravenous QHS  . piperacillin-tazobactam (ZOSYN)  IV  3.375 g Intravenous Q8H   . sodium chloride 75 mL/hr at 10/19/15 0555   Prior to Admission medications   Medication Sig Start Date End Date Taking? Authorizing Provider  ascorbic acid (VITAMIN C) 500 MG tablet Take 500 mg by mouth daily.   Yes Historical Provider, MD  calcium-vitamin D (OSCAL WITH D) 500-200 MG-UNIT per tablet  Take 1 tablet by mouth daily with breakfast.   Yes Historical Provider, MD  Cyanocobalamin (VITAMIN B 12 PO) Take 1 tablet by mouth daily.   Yes Historical Provider, MD  FOLIC ACID PO Take 1 tablet by mouth daily.   Yes Historical Provider, MD  Ibuprofen-Diphenhydramine HCl (ADVIL PM) 200-25 MG CAPS Take 1 tablet by mouth daily as needed (pain).   Yes Historical Provider, MD  MAGNESIUM PO Take 1 tablet by mouth daily.   Yes Historical Provider, MD  Multiple Vitamin (MULTIVITAMIN WITH MINERALS) TABS tablet Take 1 tablet by mouth daily.   Yes Historical Provider, MD  naproxen sodium (ANAPROX) 220 MG tablet Take 220 mg by mouth 2 (two) times daily as needed (pain).   Yes Historical Provider, MD  docusate sodium (COLACE) 100 MG capsule Take 1 capsule (100 mg total) by mouth 2 (two) times daily as needed for mild constipation. Patient not taking: Reported on 10/18/2015 07/29/15   Jene Every, MD  HYDROcodone-acetaminophen (NORCO/VICODIN) 5-325 MG tablet Take 1 tablet by mouth every 4 (four) hours as needed. Patient not taking: Reported on 10/18/2015 07/29/15   Jene Every, MD  tamsulosin (FLOMAX) 0.4 MG CAPS capsule Take 1 capsule (0.4 mg  total) by mouth daily after supper. Patient not taking: Reported on 10/18/2015 07/31/15   Dorothy SparkJaclyn M Bissell, PA-C     Assessment/Plan SBO, with thickening of single small bowel loop, mesenteric edema  Hx of  sigmoidectomy & inguinal hernia repairs Lumbar spine surgery 07/29/15,  DR. Beane Chronic constipation  Last BM 6 days ago BPH Antibiotics: Zosyn day 2 DVT:  SCD    Plan:  I will get a film, I would let him have some ice chips, will defer fluids to CCM.  Continue hydration and Derek Bentley decompression.        LOS: 1 day    Zahirah Cheslock 10/19/2015

## 2015-10-19 NOTE — Progress Notes (Signed)
PULMONARY / CRITICAL CARE MEDICINE   Name: Derek Bentley MRN: [id number] DOB: 09/16/1936    ADMISSION DATE:  10/18/2015  REFERRING MD:  EDP  CHIEF COMPLAINT:  abd pain, lactic acidosis   HISTORY OF PRESENT ILLNESS:   79yo male with hx arthritis, hx diverticulitis, recent lumbar spine surgery presents 12/18 with 5 day hx abd pain, vomiting with intermittent coffee ground emesis. Unable to take PO's for several days.  Denies fever, SOB, rectal bleeding, tarry stools.  Does take Advil PM 3-4 x per week.  In ER found to have lactic acid 14, SBP 80's. Initially thought to be in A Flutter and received amiodarone bolus but had subsequent bradycardia and this was stopped.  PCCM called for ICU admission.    SUBJECTIVE:  States abdominal pain is better. Denies any nausea.   VITAL SIGNS: BP 106/67 mmHg  Pulse 117  Temp(Src) 97.4 F (36.3 C) (Oral)  Resp 25  Ht 5\' 8"  (1.727 m)  Wt 141 lb 12.1 oz (64.3 kg)  BMI 21.56 kg/m2  SpO2 98%  HEMODYNAMICS:    VENTILATOR SETTINGS:    INTAKE / OUTPUT: I/O last 3 completed shifts: In: 5211.3 [I.V.:4111.3; IV Piggyback:1100] Out: 1256 [Urine:100; Emesis/NG output:1156]  PHYSICAL EXAMINATION: General:  Elderly man resting in bed, NAD Neuro: awake, alert and oriented x 3, moves all extremities, follows commands  HEENT:  EOMI, PERRL, mucus membranes moist  CV: RRR, no m/g/r Pulm: CTA bilaterally, breaths non-labored  Abdomen: BS hypoactive, soft, distended, mild tenderness, no guarding Ext: warm, no edema   LABS:  BMET  Recent Labs Lab 10/18/15 1024 10/18/15 1037 10/19/15 0228  NA 141 140 142  K 3.1* 3.0* 4.0  CL 101 101 107  CO2 15*  --  23  BUN 56* 58* 68*  CREATININE 2.58* 2.10* 2.42*  GLUCOSE 167* 161* 81    Electrolytes  Recent Labs Lab 10/18/15 1024 10/19/15 0228  CALCIUM 9.0 7.8*    CBC  Recent Labs Lab 10/18/15 1024 10/18/15 1037 10/19/15 0228  WBC 17.9*  --  18.0*  HGB 15.0 15.6 14.0  HCT 45.7 46.0  39.3  PLT 196  --  145*    Coag's No results for input(s): APTT, INR in the last 168 hours.  Sepsis Markers  Recent Labs Lab 10/18/15 1256 10/18/15 1352 10/18/15 1605 10/19/15 0228  LATICACIDVEN 7.54* 6.2* 4.1*  --   PROCALCITON  --  0.92  --  3.28    ABG No results for input(s): PHART, PCO2ART, PO2ART in the last 168 hours.  Liver Enzymes  Recent Labs Lab 10/18/15 1024  AST 81*  ALT 40  ALKPHOS 73  BILITOT 1.6*  ALBUMIN 3.0*    Cardiac Enzymes  Recent Labs Lab 10/18/15 1024  TROPONINI 0.07*    Glucose  Recent Labs Lab 10/18/15 1516  GLUCAP 113*    Imaging Ct Abdomen Pelvis Wo Contrast  10/18/2015  CLINICAL DATA:  Lower abdominal pain, elevated lactic acidosis, sepsis EXAM: CT ABDOMEN AND PELVIS WITHOUT CONTRAST TECHNIQUE: Multidetector CT imaging of the abdomen and pelvis was performed following the standard protocol without IV contrast. Sagittal and coronal MPR images reconstructed from axial data set. Oral contrast not administered. COMPARISON:  None. FINDINGS: Dependent atelectasis LEFT lower lobe. Scattered atherosclerotic calcification aorta and coronary arteries. Beam hardening artifacts traverse upper abdomen secondary to patient's arms within imaged field. BILATERAL renal cysts. Numerous gallstones within a potential porcelain gallbladder. Slightly irregular/ nodular hepatic margins raising question of cirrhosis. No focal mass lesions  of the liver, spleen, pancreas, or adrenal glands. No hydronephrosis or ureteral dilatation. Stomach distended small hiatal hernia. Significant dilatation of proximal small bowel loops with decompressed distal small bowel loops compatible with obstruction. Several of the dilated small bowel loops proximally show wall thickening, with significant thickening of a single small bowel loop in the mid abdomen, raising question of small bowel ischemia. On coronal images, a suggestion of swirling of the mesentery is seen though this  is not definitely confirmed on the axial images. Significant mesenteric edema. Distal small bowel and colon decompressed without wall thickening. Scattered ascites. Decompressed urinary bladder with minimal prostatic enlargement. No mass, adenopathy, or free air. Bones demineralized with pins at proximal RIGHT femur. Anterior compression deformity of T12 with Schmorl's node formation, appears old. Degenerative disc disease changes thoracolumbar spine. IMPRESSION: Dilated stomach and proximal small bowel loops with decompressed distal small bowel and colon compatible with small bowel obstruction. Significant bowel wall thickening of small bowel loops in the mid abdomen with mesenteric edema suspicious for ischemic small bowel. Suggestion of swirling of the mesentery on coronal images seen is seen which can be seen with internal hernia or small bowel volvulus though this is not seen on axial images. Findings called to Dr. Fayrene Fearing on 10/18/2015 at 1352 hours. Electronically Signed   By: Ulyses Southward M.D.   On: 10/18/2015 13:54   Dg Chest Port 1 View  10/18/2015  CLINICAL DATA:  Pain and nausea EXAM: PORTABLE CHEST - 1 VIEW COMPARISON:  07/31/2015 FINDINGS: Cardiac shadow is within normal limits. The lungs are well aerated bilaterally. No focal infiltrate or sizable effusion is seen. No bony abnormality is noted. IMPRESSION: No active disease. Electronically Signed   By: Alcide Clever M.D.   On: 10/18/2015 11:02   Dg Abd Portable 1v  10/18/2015  CLINICAL DATA:  NG tube placement. EXAM: PORTABLE ABDOMEN - 1 VIEW COMPARISON:  CT, 10/18/2015 at 1303 hours FINDINGS: Nasogastric tube passes below the diaphragm well into the stomach. There is little bowel air on the supine exam. No radiographic evidence of obstruction. Dilated small bowel loops seen on earlier CT scan may not be visible due to the supine technique lack of air. Soft tissues are unremarkable. IMPRESSION: Nasogastric tube well positioned with its tip in the  mid stomach. Electronically Signed   By: Amie Portland M.D.   On: 10/18/2015 17:37     STUDIES:  CT abd/pelvis 12/18>>>SBO w/ significant bowel wall thickening of small bowel loops in the mid abdomen with mesenteric edema suspicious for ischemic small bowel.  CULTURES: Blood cx 12/18>>  ANTIBIOTICS: Zosyn 12/18>>>  SIGNIFICANT EVENTS: 12/18>> Admitted to ICU w/ abd pain, lactic acidosis found to have a SBO  LINES/TUBES:   DISCUSSION: 79yo male presenting with 5 day hx abd pain, lactic acidosis, hypotension.    ASSESSMENT / PLAN:  PULMONARY No active issue  P:   Supplemental O2 as needed  upright  CARDIOVASCULAR Hypotension, in setting of AFib Hypovolemia R/o sepsis source A fib with Tachycardia  Bradycardia - after amiodarone admin  P:  volume resuscitation maintain Tele  RENAL AKI - r/t sepsis + NSAIDS Metabolic acidosis- resolving Lactic acidosis- improving P:   Chem in AM Volume as above Saline at 75 keep Chem am  GASTROINTESTINAL SBO-0 imrpoving Nausea - ?coffee ground emesis Poor PO intake Hx sigmoidectomy P:   Continue NG decompression Abd x-ray without much change Surgery following, no surgical intervention needed currently  PPI  NPO   HEMATOLOGIC ?coffee ground  emesis  Leukocytosis- abd source? P:  Trend CBC am SCD's  No sig bleeding, risk dvt higher, add sub q hep  INFECTIOUS Sepsis - presumed abd source At risk bacterial translocation  P:   Empiric zosyn, consider rapid transition in am to off or ceftriaxone Lactic acid decreasing PCT increased to 3.28, repeat in am with clinical status, note renal fxn up Blood cx pending  ENDOCRINE No active issue  P:   Monitor glucose on chem   NEUROLOGIC Back pain  P:   Hold NSAIDS  PRN fentanyl    FAMILY  - Updates:   Pt, wife updated at bedside 12/18, 12/19   Rich Number, MD, MPH Internal Medicine Resident, PGY-II Pager: [phone]  STAFF NOTE: Cindi Carbon, MD FACP  have personally reviewed patient's available data, including medical history, events of note, physical examination and test results as part of my evaluation. I have discussed with resident/NP and other care providers such as pharmacist, RN and RRT. In addition, I personally evaluated patient and elicited key findings of: no distress, no pressors, lactic clearing well, keep pos balance , has ARF, move to floor, assess pct in am , keep empiric zosyn, NPO, surgery following, ngt remain, abdo film unimpressive, he feels better, to triad   Derek Rossetti. Karow Alias, MD, FACP Pgr: [phone] [facility] 10/19/2015 11:55 AM

## 2015-10-19 NOTE — Progress Notes (Signed)
Pt arrived to unit via bed. Pt alert and oriented, wife at bedside. Connected to low intermittent wall suction. Applied condom catheter. Put pt on telemetry box 6, called CCMD. Will continue to monitor.    Ok AnisSanders,Ashanti Ratti A, RN 10/19/2015

## 2015-10-19 NOTE — Care Management Note (Addendum)
Case Management Note  Patient Details  Name: Derek Bentley MRN: [id number] Date of Birth: 11/05/1935  Subjective/Objective:       Small bowel obstruction             Action/Plan: NCM spoke to pt and wife, Derek Bentley, [phone]. Wife reports pt has wheelchair, hospital bed, rolling walker, and bedside commode. States he had HH with [facility] in the past. States he is not interested in having HHPT anymore, he has exercises at home. States after his treatment he would be in pain. Will continue to follow up for possible HH RN at dc. No DME needed at this time. Wife states he can afford his medications at home. Pt has insurance with [employer]. Provided info to admitting to enter into his chart.    Expected Discharge Date:                  Expected Discharge Plan:  Home/Self Care  In-House Referral:  NA  Discharge planning Services  CM Consult  Post Acute Care Choice:  NA Choice offered to:  NA  DME Arranged:  N/A DME Agency:  NA  HH Arranged:    HH Agency:     Status of Service:  Completed, signed off  Medicare Important Message Given:    Date Medicare IM Given:    Medicare IM give by:    Date Additional Medicare IM Given:    Additional Medicare Important Message give by:     If discussed at Long Length of Stay Meetings, dates discussed:    Additional Comments:  Elliot CousinShavis, Dajanique Robley Ellen, RN 10/19/2015, 4:04 PM

## 2015-10-20 ENCOUNTER — Inpatient Hospital Stay (HOSPITAL_COMMUNITY): Payer: Medicare Other

## 2015-10-20 DIAGNOSIS — N179 Acute kidney failure, unspecified: Secondary | ICD-10-CM | POA: Insufficient documentation

## 2015-10-20 DIAGNOSIS — E876 Hypokalemia: Secondary | ICD-10-CM

## 2015-10-20 DIAGNOSIS — R031 Nonspecific low blood-pressure reading: Secondary | ICD-10-CM

## 2015-10-20 LAB — URINALYSIS, ROUTINE W REFLEX MICROSCOPIC
BILIRUBIN URINE: NEGATIVE
Glucose, UA: NEGATIVE mg/dL
KETONES UR: 40 mg/dL — AB
Leukocytes, UA: NEGATIVE
NITRITE: NEGATIVE
Protein, ur: 30 mg/dL — AB
SPECIFIC GRAVITY, URINE: 1.029 (ref 1.005–1.030)
pH: 5.5 (ref 5.0–8.0)

## 2015-10-20 LAB — URINE MICROSCOPIC-ADD ON

## 2015-10-20 LAB — CBC WITH DIFFERENTIAL/PLATELET
BASOS PCT: 0 %
Basophils Absolute: 0 10*3/uL (ref 0.0–0.1)
EOS PCT: 1 %
Eosinophils Absolute: 0.1 10*3/uL (ref 0.0–0.7)
HCT: 33.9 % — ABNORMAL LOW (ref 39.0–52.0)
HEMOGLOBIN: 11.4 g/dL — AB (ref 13.0–17.0)
LYMPHS ABS: 1.4 10*3/uL (ref 0.7–4.0)
Lymphocytes Relative: 10 %
MCH: 33.6 pg (ref 26.0–34.0)
MCHC: 33.6 g/dL (ref 30.0–36.0)
MCV: 100 fL (ref 78.0–100.0)
MONO ABS: 1.8 10*3/uL — AB (ref 0.1–1.0)
Monocytes Relative: 13 %
NEUTROS ABS: 10.2 10*3/uL — AB (ref 1.7–7.7)
Neutrophils Relative %: 76 %
Platelets: 180 10*3/uL (ref 150–400)
RBC: 3.39 MIL/uL — ABNORMAL LOW (ref 4.22–5.81)
RDW: 13.6 % (ref 11.5–15.5)
WBC: 13.5 10*3/uL — ABNORMAL HIGH (ref 4.0–10.5)

## 2015-10-20 LAB — BASIC METABOLIC PANEL
Anion gap: 14 (ref 5–15)
BUN: 57 mg/dL — AB (ref 6–20)
CHLORIDE: 112 mmol/L — AB (ref 101–111)
CO2: 20 mmol/L — AB (ref 22–32)
CREATININE: 1.74 mg/dL — AB (ref 0.61–1.24)
Calcium: 7.9 mg/dL — ABNORMAL LOW (ref 8.9–10.3)
GFR calc Af Amer: 42 mL/min — ABNORMAL LOW (ref >60)
GFR calc non Af Amer: 36 mL/min — ABNORMAL LOW (ref >60)
GLUCOSE: 55 mg/dL — AB (ref 65–99)
Potassium: 3.3 mmol/L — ABNORMAL LOW (ref 3.5–5.1)
SODIUM: 146 mmol/L — AB (ref 135–145)

## 2015-10-20 LAB — LACTIC ACID, PLASMA: Lactic Acid, Venous: 0.9 mmol/L (ref 0.5–2.0)

## 2015-10-20 LAB — PROCALCITONIN: Procalcitonin: 1.68 ng/mL

## 2015-10-20 LAB — MAGNESIUM: MAGNESIUM: 2.7 mg/dL — AB (ref 1.7–2.4)

## 2015-10-20 MED ORDER — POTASSIUM CHLORIDE 10 MEQ/100ML IV SOLN
10.0000 meq | INTRAVENOUS | Status: AC
  Administered 2015-10-20 (×3): 10 meq via INTRAVENOUS
  Filled 2015-10-20: qty 100

## 2015-10-20 MED ORDER — POTASSIUM CHLORIDE 10 MEQ/100ML IV SOLN
10.0000 meq | INTRAVENOUS | Status: AC
  Administered 2015-10-20: 10 meq via INTRAVENOUS
  Filled 2015-10-20 (×3): qty 100

## 2015-10-20 MED ORDER — POTASSIUM CHLORIDE 2 MEQ/ML IV SOLN
INTRAVENOUS | Status: DC
  Administered 2015-10-20 – 2015-10-21 (×4): via INTRAVENOUS
  Filled 2015-10-20 (×9): qty 1000

## 2015-10-20 NOTE — Progress Notes (Signed)
[facility] PROGRESS NOTE  ETHAN CLAYBURN UJW:[id number] DOB: 1935-12-18 DOA: 10/18/2015 PCP: Thayer Headings, MD  Assessment/Plan: #1 small bowel obstruction Likely secondary to adhesions. Patient still with no improvement with a small bowel obstruction. No flatus. No bowel movements. NG tube in place. Continue empiric IV Zosyn. Keep potassium around 4. Keep magnesium greater than 2. Mobilize. Per general surgery if no significant improvement in the next 1-2 days will likely need expiratory laparotomy for further evaluation and management. Supportive care. Per general surgery.  #2 acute renal failure Likely secondary to a prerenal azotemia. Renal function slowly improving. Continue IV fluids. Follow.  #3 hypokalemia Replete.  #4 hypotension Secondary to volume depletion and possible infectious etiology. Improved with IV fluids. Patient has been pancultured. Continue Zosyn. Follow.  #5 metabolic acidosis/Lactic acidosis Improvement.  #6 leukocytosis Questionable etiology. Possible abdominal source. WBC trending down. Patient has been pancultured cultures are pending. Continue empiric IV Zosyn. Follow.  #7 prophylaxis PPI for GI prophylaxis. Heparin for DVT prophylaxis.  Code Status: Full Family Communication: Updated patient and wife at bedside. Disposition Plan: Home once small bowel obstruction has resolved and per general surgery.   Consultants:  PCCM admit.  General Surgery:Dr Kinsinger 10/18/2015  Procedures:  CT abdomen and pelvis 10/18/2015>>>> small bowel obstruction with significant bowel wall thickening of small bowel loops in the midabdomen with mesenteric edema suspicious for ischemic small bowel.  Abdominal films 10/18/2015, 10/19/2015, 10/20/2015.  Chest x-ray 10/18/2015  Antibiotics:  IV Zosyn 10/18/2015  HPI/Subjective: Patient denies any abdominal pain. No nausea. No emesis. Patient asking for water as he states his mouth is dry. Patient  denies any flatus. No bowel movement.  Objective: Filed Vitals:   10/20/15 0447 10/20/15 0820  BP: 97/55 104/55  Pulse: 111 101  Temp: 97.6 F (36.4 C) 97.7 F (36.5 C)  Resp: 18 18    Intake/Output Summary (Last 24 hours) at 10/20/15 1425 Last data filed at 10/20/15 0600  Gross per 24 hour  Intake 1251.25 ml  Output   1750 ml  Net -498.75 ml   Filed Weights   10/18/15 1600 10/19/15 0426 10/19/15 1652  Weight: 63.2 kg (139 lb 5.3 oz) 64.3 kg (141 lb 12.1 oz) 67.268 kg (148 lb 4.8 oz)    Exam:   General:  NAD. NGT in place.  Cardiovascular: RRR  Respiratory: CTAB  Abdomen: Soft, nontender, nondistended, hypoactive bowel sounds.  Musculoskeletal: No clubbing cyanosis or edema.  Data Reviewed: Basic Metabolic Panel:  Recent Labs Lab 10/18/15 1024 10/18/15 1037 10/19/15 0228 10/20/15 0725 10/20/15 0945  NA 141 140 142 146*  --   K 3.1* 3.0* 4.0 3.3*  --   CL 101 101 107 112*  --   CO2 15*  --  23 20*  --   GLUCOSE 167* 161* 81 55*  --   BUN 56* 58* 68* 57*  --   CREATININE 2.58* 2.10* 2.42* 1.74*  --   CALCIUM 9.0  --  7.8* 7.9*  --   MG  --   --   --   --  2.7*   Liver Function Tests:  Recent Labs Lab 10/18/15 1024  AST 81*  ALT 40  ALKPHOS 73  BILITOT 1.6*  PROT 5.6*  ALBUMIN 3.0*   No results for input(s): LIPASE, AMYLASE in the last 168 hours. No results for input(s): AMMONIA in the last 168 hours. CBC:  Recent Labs Lab 10/18/15 1024 10/18/15 1037 10/19/15 0228 10/20/15 0945  WBC 17.9*  --  18.0*  13.5*  NEUTROABS 14.9*  --   --  10.2*  HGB 15.0 15.6 14.0 11.4*  HCT 45.7 46.0 39.3 33.9*  MCV 101.3*  --  97.5 100.0  PLT 196  --  145* 180   Cardiac Enzymes:  Recent Labs Lab 10/18/15 1024  TROPONINI 0.07*   BNP (last 3 results)  Recent Labs  07/30/15 0530  BNP 299.2*    ProBNP (last 3 results) No results for input(s): PROBNP in the last 8760 hours.  CBG:  Recent Labs Lab 10/18/15 1516  GLUCAP 113*    Recent  Results (from the past 240 hour(s))  Culture, blood (Routine X 2) w Reflex to ID Panel     Status: None (Preliminary result)   Collection Time: 10/18/15 10:25 AM  Result Value Ref Range Status   Specimen Description BLOOD RIGHT ANTECUBITAL  Final   Special Requests BOTTLES DRAWN AEROBIC ONLY 5CC  Final   Culture NO GROWTH 2 DAYS  Final   Report Status PENDING  Incomplete  Culture, blood (Routine X 2) w Reflex to ID Panel     Status: None (Preliminary result)   Collection Time: 10/18/15 11:10 AM  Result Value Ref Range Status   Specimen Description BLOOD RIGHT HAND  Final   Special Requests BOTTLES DRAWN AEROBIC AND ANAEROBIC 5CC  Final   Culture NO GROWTH 2 DAYS  Final   Report Status PENDING  Incomplete  MRSA PCR Screening     Status: None   Collection Time: 10/18/15  3:21 PM  Result Value Ref Range Status   MRSA by PCR NEGATIVE NEGATIVE Final    Comment:        The GeneXpert MRSA Assay (FDA approved for NASAL specimens only), is one component of a comprehensive MRSA colonization surveillance program. It is not intended to diagnose MRSA infection nor to guide or monitor treatment for MRSA infections.      Studies: Dg Abd 1 View  10/20/2015  CLINICAL DATA:  Small bowel obstruction with diffuse abdominal pain, NG tube has been placed EXAM: ABDOMEN - 1 VIEW COMPARISON:  10/19/2015 FINDINGS: A dilated loop of mid abdomen small bowel is noted. There is minimal gas otherwise within the abdomen. An NG tube is seen with its tip over the anticipated position of the antrum of the stomach. IMPRESSION: Findings consistent with small-bowel obstruction. NG tube as described. Electronically Signed   By: Esperanza Heiraymond  Rubner M.D.   On: 10/20/2015 12:30   Dg Abd Portable 1v  10/19/2015  CLINICAL DATA:  Small bowel obstruction.  Enteric tube. EXAM: PORTABLE ABDOMEN - 1 VIEW COMPARISON:  Abdominal radiographs and CT abdomen from 1 day prior. FINDINGS: Enteric tube terminates in the proximal stomach.  There is a solitary mildly dilated small bowel loop in the left abdomen, noting a relative paucity of gas in the small bowel. Mild stool in the colon and rectum. No evidence of pneumatosis or pneumoperitoneum. Patchy opacity at the left lung base. Surgical pins overlie the left femoral neck. IMPRESSION: 1. Enteric tube terminates in the proximal stomach . 2. Persistent paucity of small bowel gas with mildly dilated proximal small bowel loop, not appreciably changed, in keeping with a persistent mid to distal small bowel obstruction with fluid-filled small bowel loops as seen on recent CT. Electronically Signed   By: Delbert PhenixJason A Poff M.D.   On: 10/19/2015 09:37   Dg Abd Portable 1v  10/18/2015  CLINICAL DATA:  NG tube placement. EXAM: PORTABLE ABDOMEN - 1 VIEW COMPARISON:  CT,  10/18/2015 at 1303 hours FINDINGS: Nasogastric tube passes below the diaphragm well into the stomach. There is little bowel air on the supine exam. No radiographic evidence of obstruction. Dilated small bowel loops seen on earlier CT scan may not be visible due to the supine technique lack of air. Soft tissues are unremarkable. IMPRESSION: Nasogastric tube well positioned with its tip in the mid stomach. Electronically Signed   By: Amie Portland M.D.   On: 10/18/2015 17:37    Scheduled Meds: . antiseptic oral rinse  7 mL Mouth Rinse q12n4p  . chlorhexidine  15 mL Mouth Rinse BID  . heparin subcutaneous  5,000 Units Subcutaneous 3 times per day  . pantoprazole (PROTONIX) IV  40 mg Intravenous QHS  . piperacillin-tazobactam (ZOSYN)  IV  3.375 g Intravenous Q8H  . potassium chloride  10 mEq Intravenous Q2H   Continuous Infusions: . dextrose 5 % and 0.45% NaCl 1,000 mL with potassium chloride 40 mEq infusion 100 mL/hr at 10/20/15 1110    Principal Problem:   Abdominal pain Active Problems:   Transient hypotension   Lactic acidosis   Obstruction (acquired) of bladder neck or vesicourethral orifice   SBO (small bowel obstruction)  (HCC)    Time spent: 71 MINS    [facility] MD [facility] Pager [phone]. If 7PM-7AM, please contact night-coverage at [email or web address], password [facility] 10/20/2015, 2:25 PM  LOS: 2 days

## 2015-10-20 NOTE — Progress Notes (Signed)
Found patient's NGT out and on the floor. Patient states, "the nurse with tattoos pulled it out". Replaced NGT via right nare. Connected to LIWS. Patient tolerated well. Will continue to monitor.

## 2015-10-20 NOTE — Progress Notes (Signed)
Pharmacy Antibiotic Follow-up Note  Derek Bentley is a 79 y.o. year-old male admitted on 10/18/2015.  The patient is currently on day 3 of Zosyn for sepsis d/t presumed abdominal source.  Assessment/Plan: - Continue Zosyn 3.375g IV q8h  - Pharmacy will sign off  Temp (24hrs), Avg:97.7 F (36.5 C), Min:97.6 F (36.4 C), Max:97.9 F (36.6 C)   Recent Labs Lab 10/18/15 1024 10/19/15 0228 10/20/15 0945  WBC 17.9* 18.0* 13.5*    Recent Labs Lab 10/18/15 1024 10/18/15 1037 10/19/15 0228 10/20/15 0725  CREATININE 2.58* 2.10* 2.42* 1.74*   Estimated Creatinine Clearance: 33.8 mL/min (by C-G formula based on Cr of 1.74).    No Known Allergies  Antimicrobials this admission: Vanc 12/18>>12/18 Zosyn 12/18>>   Microbiology results: 12/18 BCx2>>ngtd  Thank you for allowing pharmacy to be a part of this patient's care.  Casilda Carlsaylor Kiree Dejarnette, PharmD. PGY-1 Pharmacy Resident Pager: [phone] 10/20/2015 10:36 AM

## 2015-10-20 NOTE — Progress Notes (Signed)
Subjective: He is parched and for some reason isn't getting ice chips.  Ongoing NG drainage, tube came out last PM and had to be reinserted.  I checked and it is working fine.    Objective: Vital signs in last 24 hours: Temp:  [97.6 F (36.4 C)-97.9 F (36.6 C)] 97.7 F (36.5 C) (12/20 0820) Pulse Rate:  [93-118] 101 (12/20 0820) Resp:  [18-26] 18 (12/20 0820) BP: (84-113)/(49-79) 104/55 mmHg (12/20 0820) SpO2:  [93 %-98 %] 95 % (12/20 0820) Weight:  [67.268 kg (148 lb 4.8 oz)] 67.268 kg (148 lb 4.8 oz) (12/19 1652) Last BM Date: 10/13/15 1350 from the NG NO BM Afebrile, some tachycardia, BP 97-100 range Creatinine is better NA is stll high and K+ is low WBC was 18K yesterday, CBC is pending Film yesterday showed:  Persistent paucity of small bowel gas with mildly dilated proximal small bowel loop, not appreciably changed, in keeping with a persistent mid to distal small bowel obstruction with fluid-filled small bowel loops as seen on recent CT. Intake/Output from previous day: 12/19 0701 - 12/20 0700 In: 1826.3 [I.V.:1713.8; IV Piggyback:112.5] Out: 1950 [Urine:600; Emesis/NG output:1350] Intake/Output this shift:    General appearance: alert, cooperative and no distress GI: soft, some bowel sounds, no flatus, he is not distended, abdomen is soft.  Lab Results:   Recent Labs  10/18/15 1024 10/18/15 1037 10/19/15 0228  WBC 17.9*  --  18.0*  HGB 15.0 15.6 14.0  HCT 45.7 46.0 39.3  PLT 196  --  145*    BMET  Recent Labs  10/19/15 0228 10/20/15 0725  NA 142 146*  K 4.0 3.3*  CL 107 112*  CO2 23 20*  GLUCOSE 81 55*  BUN 68* 57*  CREATININE 2.42* 1.74*  CALCIUM 7.8* 7.9*   PT/INR No results for input(s): LABPROT, INR in the last 72 hours.   Recent Labs Lab 10/18/15 1024  AST 81*  ALT 40  ALKPHOS 73  BILITOT 1.6*  PROT 5.6*  ALBUMIN 3.0*     Lipase  No results found for: LIPASE   Studies/Results: Ct Abdomen Pelvis Wo  Contrast  10/18/2015  CLINICAL DATA:  Lower abdominal pain, elevated lactic acidosis, sepsis EXAM: CT ABDOMEN AND PELVIS WITHOUT CONTRAST TECHNIQUE: Multidetector CT imaging of the abdomen and pelvis was performed following the standard protocol without IV contrast. Sagittal and coronal MPR images reconstructed from axial data set. Oral contrast not administered. COMPARISON:  None. FINDINGS: Dependent atelectasis LEFT lower lobe. Scattered atherosclerotic calcification aorta and coronary arteries. Beam hardening artifacts traverse upper abdomen secondary to patient's arms within imaged field. BILATERAL renal cysts. Numerous gallstones within a potential porcelain gallbladder. Slightly irregular/ nodular hepatic margins raising question of cirrhosis. No focal mass lesions of the liver, spleen, pancreas, or adrenal glands. No hydronephrosis or ureteral dilatation. Stomach distended small hiatal hernia. Significant dilatation of proximal small bowel loops with decompressed distal small bowel loops compatible with obstruction. Several of the dilated small bowel loops proximally show wall thickening, with significant thickening of a single small bowel loop in the mid abdomen, raising question of small bowel ischemia. On coronal images, a suggestion of swirling of the mesentery is seen though this is not definitely confirmed on the axial images. Significant mesenteric edema. Distal small bowel and colon decompressed without wall thickening. Scattered ascites. Decompressed urinary bladder with minimal prostatic enlargement. No mass, adenopathy, or free air. Bones demineralized with pins at proximal RIGHT femur. Anterior compression deformity of T12 with Schmorl's node formation, appears  old. Degenerative disc disease changes thoracolumbar spine. IMPRESSION: Dilated stomach and proximal small bowel loops with decompressed distal small bowel and colon compatible with small bowel obstruction. Significant bowel wall  thickening of small bowel loops in the mid abdomen with mesenteric edema suspicious for ischemic small bowel. Suggestion of swirling of the mesentery on coronal images seen is seen which can be seen with internal hernia or small bowel volvulus though this is not seen on axial images. Findings called to Dr. Fayrene FearingJames on 10/18/2015 at 1352 hours. Electronically Signed   By: Ulyses SouthwardMark  Boles M.D.   On: 10/18/2015 13:54   Dg Chest Port 1 View  10/18/2015  CLINICAL DATA:  Pain and nausea EXAM: PORTABLE CHEST - 1 VIEW COMPARISON:  07/31/2015 FINDINGS: Cardiac shadow is within normal limits. The lungs are well aerated bilaterally. No focal infiltrate or sizable effusion is seen. No bony abnormality is noted. IMPRESSION: No active disease. Electronically Signed   By: Alcide CleverMark  Lukens M.D.   On: 10/18/2015 11:02   Dg Abd Portable 1v  10/19/2015  CLINICAL DATA:  Small bowel obstruction.  Enteric tube. EXAM: PORTABLE ABDOMEN - 1 VIEW COMPARISON:  Abdominal radiographs and CT abdomen from 1 day prior. FINDINGS: Enteric tube terminates in the proximal stomach. There is a solitary mildly dilated small bowel loop in the left abdomen, noting a relative paucity of gas in the small bowel. Mild stool in the colon and rectum. No evidence of pneumatosis or pneumoperitoneum. Patchy opacity at the left lung base. Surgical pins overlie the left femoral neck. IMPRESSION: 1. Enteric tube terminates in the proximal stomach . 2. Persistent paucity of small bowel gas with mildly dilated proximal small bowel loop, not appreciably changed, in keeping with a persistent mid to distal small bowel obstruction with fluid-filled small bowel loops as seen on recent CT. Electronically Signed   By: Delbert PhenixJason A Poff M.D.   On: 10/19/2015 09:37   Dg Abd Portable 1v  10/18/2015  CLINICAL DATA:  NG tube placement. EXAM: PORTABLE ABDOMEN - 1 VIEW COMPARISON:  CT, 10/18/2015 at 1303 hours FINDINGS: Nasogastric tube passes below the diaphragm well into the stomach.  There is little bowel air on the supine exam. No radiographic evidence of obstruction. Dilated small bowel loops seen on earlier CT scan may not be visible due to the supine technique lack of air. Soft tissues are unremarkable. IMPRESSION: Nasogastric tube well positioned with its tip in the mid stomach. Electronically Signed   By: Amie Portlandavid  Ormond M.D.   On: 10/18/2015 17:37    Medications: . antiseptic oral rinse  7 mL Mouth Rinse q12n4p  . chlorhexidine  15 mL Mouth Rinse BID  . heparin subcutaneous  5,000 Units Subcutaneous 3 times per day  . pantoprazole (PROTONIX) IV  40 mg Intravenous QHS  . piperacillin-tazobactam (ZOSYN)  IV  3.375 g Intravenous Q8H  . potassium chloride  10 mEq Intravenous Q1 Hr x 4   . dextrose 5 % and 0.45% NaCl 1,000 mL with potassium chloride 40 mEq infusion     Assessment/Plan SBO, with thickening of single small bowel loop, mesenteric edema  Hx of sigmoidectomy & inguinal hernia repairs Lumbar spine surgery 07/29/15, DR. Beane Chronic constipation Last BM 6 days ago BPH Antibiotics: Zosyn day 3 DVT: SCD/Heparin     Plan:  Recheck film, he should have more labs pending.  He needs his electrolytes corrected. If film not better I will discuss SB protocol with Dr. Corliss Skainssuei.  LOS: 2 days    Derek Bentley  10/20/2015   

## 2015-10-21 ENCOUNTER — Inpatient Hospital Stay (HOSPITAL_COMMUNITY): Payer: Medicare Other

## 2015-10-21 LAB — URINE CULTURE: Culture: NO GROWTH

## 2015-10-21 LAB — BASIC METABOLIC PANEL
Anion gap: 6 (ref 5–15)
BUN: 31 mg/dL — AB (ref 6–20)
CHLORIDE: 117 mmol/L — AB (ref 101–111)
CO2: 26 mmol/L (ref 22–32)
CREATININE: 1.08 mg/dL (ref 0.61–1.24)
Calcium: 7.7 mg/dL — ABNORMAL LOW (ref 8.9–10.3)
GFR calc Af Amer: >60 mL/min (ref >60)
GFR calc non Af Amer: >60 mL/min (ref >60)
GLUCOSE: 107 mg/dL — AB (ref 65–99)
POTASSIUM: 4.1 mmol/L (ref 3.5–5.1)
Sodium: 149 mmol/L — ABNORMAL HIGH (ref 135–145)

## 2015-10-21 LAB — CBC
HCT: 31.7 % — ABNORMAL LOW (ref 39.0–52.0)
HEMOGLOBIN: 10.6 g/dL — AB (ref 13.0–17.0)
MCH: 33.7 pg (ref 26.0–34.0)
MCHC: 33.4 g/dL (ref 30.0–36.0)
MCV: 100.6 fL — AB (ref 78.0–100.0)
Platelets: 201 10*3/uL (ref 150–400)
RBC: 3.15 MIL/uL — AB (ref 4.22–5.81)
RDW: 13.6 % (ref 11.5–15.5)
WBC: 10.7 10*3/uL — ABNORMAL HIGH (ref 4.0–10.5)

## 2015-10-21 LAB — MAGNESIUM: Magnesium: 2.6 mg/dL — ABNORMAL HIGH (ref 1.7–2.4)

## 2015-10-21 MED ORDER — BISACODYL 10 MG RE SUPP
10.0000 mg | Freq: Once | RECTAL | Status: AC
  Administered 2015-10-21: 10 mg via RECTAL
  Filled 2015-10-21: qty 1

## 2015-10-21 NOTE — Progress Notes (Signed)
Per surgery provider, pt is ok to have clear liquids from the floor. Residual 0 mL, NG tube discontinued. Pt up in the chair, ambulated to the bathroom. Given chicken broth and jello. Two bowel movements. Pt ambulated to nursing station and back to room .

## 2015-10-21 NOTE — Progress Notes (Signed)
[facility] PROGRESS NOTE  Clarice PoleJulius G Limbert JWJ:[id number] DOB: 01/03/1936 DOA: 10/18/2015 PCP: Thayer HeadingsMACKENZIE,BRIAN, MD  Assessment/Plan: #1 small bowel obstruction Likely secondary to adhesions. Patient still with no improvement with a small bowel obstruction.  NG tube in place. Continue empiric IV Zosyn. Keep potassium around 4. Keep magnesium greater than 2. Mobilize. Per general surgery if no significant improvement in the next 1-2 days will likely need expiratory laparotomy for further evaluation and management.  Today his ABD fiml shows resolving SBO, NG clamped and sips of water given. Followed by clear liquid diet if able to tolerate. Supportive care. Per general surgery.  #2 acute renal failure Likely secondary to a prerenal azotemia. Renal function slowly improving. Continue IV fluids. Follow. Improved.  #3 hypokalemia Replete.  #4 hypotension Secondary to volume depletion and possible infectious etiology. Improved with IV fluids. Patient has been pancultured. Continue Zosyn. Follow.  #5 metabolic acidosis/Lactic acidosis Improved  #6 leukocytosis Questionable etiology. Possible abdominal source. WBC trending down. Patient has been pancultured cultures are pending. Continue empiric IV Zosyn. Follow.  #7 prophylaxis PPI for GI prophylaxis. Heparin for DVT prophylaxis.  Code Status: Full Family Communication: Updated patient and wife at bedside. Disposition Plan: Home once small bowel obstruction has resolved and per general surgery.   Consultants:  PCCM admit.  General Surgery:Dr Kinsinger 10/18/2015  Procedures:  CT abdomen and pelvis 10/18/2015>>>> small bowel obstruction with significant bowel wall thickening of small bowel loops in the midabdomen with mesenteric edema suspicious for ischemic small bowel.  Abdominal films 10/18/2015, 10/19/2015, 10/20/2015.  Chest x-ray 10/18/2015  Antibiotics:  IV Zosyn 10/18/2015  HPI/Subjective: Patient denies any  abdominal pain. No nausea. No emesis. PT eval ordered.   Objective: Filed Vitals:   10/21/15 0854 10/21/15 1827  BP: 106/58 120/64  Pulse: 68 72  Temp: 97.8 F (36.6 C) 98.1 F (36.7 C)  Resp: 18 18    Intake/Output Summary (Last 24 hours) at 10/21/15 1903 Last data filed at 10/21/15 1811  Gross per 24 hour  Intake 2270.83 ml  Output   1050 ml  Net 1220.83 ml   Filed Weights   10/19/15 0426 10/19/15 1652 10/20/15 2008  Weight: 64.3 kg (141 lb 12.1 oz) 67.268 kg (148 lb 4.8 oz) 64.5 kg (142 lb 3.2 oz)    Exam:   General:  NAD. NGT in place.  Cardiovascular: RRR  Respiratory: CTAB  Abdomen: Soft, nontender, nondistended, hypoactive bowel sounds.  Musculoskeletal: No clubbing cyanosis or edema.  Data Reviewed: Basic Metabolic Panel:  Recent Labs Lab 10/18/15 1024 10/18/15 1037 10/19/15 0228 10/20/15 0725 10/20/15 0945 10/21/15 1030  NA 141 140 142 146*  --  149*  K 3.1* 3.0* 4.0 3.3*  --  4.1  CL 101 101 107 112*  --  117*  CO2 15*  --  23 20*  --  26  GLUCOSE 167* 161* 81 55*  --  107*  BUN 56* 58* 68* 57*  --  31*  CREATININE 2.58* 2.10* 2.42* 1.74*  --  1.08  CALCIUM 9.0  --  7.8* 7.9*  --  7.7*  MG  --   --   --   --  2.7* 2.6*   Liver Function Tests:  Recent Labs Lab 10/18/15 1024  AST 81*  ALT 40  ALKPHOS 73  BILITOT 1.6*  PROT 5.6*  ALBUMIN 3.0*   No results for input(s): LIPASE, AMYLASE in the last 168 hours. No results for input(s): AMMONIA in the last 168 hours. CBC:  Recent Labs Lab 10/18/15 1024 10/18/15 1037 10/19/15 0228 10/20/15 0945 10/21/15 1030  WBC 17.9*  --  18.0* 13.5* 10.7*  NEUTROABS 14.9*  --   --  10.2*  --   HGB 15.0 15.6 14.0 11.4* 10.6*  HCT 45.7 46.0 39.3 33.9* 31.7*  MCV 101.3*  --  97.5 100.0 100.6*  PLT 196  --  145* 180 201   Cardiac Enzymes:  Recent Labs Lab 10/18/15 1024  TROPONINI 0.07*   BNP (last 3 results)  Recent Labs  07/30/15 0530  BNP 299.2*    ProBNP (last 3 results) No  results for input(s): PROBNP in the last 8760 hours.  CBG:  Recent Labs Lab 10/18/15 1516  GLUCAP 113*    Recent Results (from the past 240 hour(s))  Culture, blood (Routine X 2) w Reflex to ID Panel     Status: None (Preliminary result)   Collection Time: 10/18/15 10:25 AM  Result Value Ref Range Status   Specimen Description BLOOD RIGHT ANTECUBITAL  Final   Special Requests BOTTLES DRAWN AEROBIC ONLY 5CC  Final   Culture NO GROWTH 3 DAYS  Final   Report Status PENDING  Incomplete  Culture, blood (Routine X 2) w Reflex to ID Panel     Status: None (Preliminary result)   Collection Time: 10/18/15 11:10 AM  Result Value Ref Range Status   Specimen Description BLOOD RIGHT HAND  Final   Special Requests BOTTLES DRAWN AEROBIC AND ANAEROBIC 5CC  Final   Culture NO GROWTH 3 DAYS  Final   Report Status PENDING  Incomplete  MRSA PCR Screening     Status: None   Collection Time: 10/18/15  3:21 PM  Result Value Ref Range Status   MRSA by PCR NEGATIVE NEGATIVE Final    Comment:        The GeneXpert MRSA Assay (FDA approved for NASAL specimens only), is one component of a comprehensive MRSA colonization surveillance program. It is not intended to diagnose MRSA infection nor to guide or monitor treatment for MRSA infections.   Urine culture     Status: None   Collection Time: 10/20/15  6:11 PM  Result Value Ref Range Status   Specimen Description URINE, RANDOM  Final   Special Requests NONE  Final   Culture NO GROWTH 1 DAY  Final   Report Status 10/21/2015 FINAL  Final     Studies: Dg Abd 1 View  10/20/2015  CLINICAL DATA:  Small bowel obstruction with diffuse abdominal pain, NG tube has been placed EXAM: ABDOMEN - 1 VIEW COMPARISON:  10/19/2015 FINDINGS: A dilated loop of mid abdomen small bowel is noted. There is minimal gas otherwise within the abdomen. An NG tube is seen with its tip over the anticipated position of the antrum of the stomach. IMPRESSION: Findings consistent  with small-bowel obstruction. NG tube as described. Electronically Signed   By: Esperanza Heir M.D.   On: 10/20/2015 12:30   Dg Abd 2 Views  10/21/2015  CLINICAL DATA:  Follow-up small bowel obstruction, asymptomatic today EXAM: ABDOMEN - 2 VIEW COMPARISON:  Supine abdominal film dated October 20, 2015 FINDINGS: There are few loops of minimally distended gas-filled small bowel in the upper abdomen. The nasogastric tube lies in the distal aspect of the partially gas-filled stomach. There are stable calcifications projecting above the right iliac crest. There stable degenerative changes and dextrocurvature of the thoracolumbar spine. Stable severe degenerative changes of both hips are noted. The patient has undergone previous ORIF for  subcapital fracture of the left hip. IMPRESSION: No significant obstructive pattern is observed today. There do remain a few loops of mildly distended gas and fluid-filled small bowel in the upper abdomen. Electronically Signed   By: David  [place] M.D.   On: 10/21/2015 08:08    Scheduled Meds: . antiseptic oral rinse  7 mL Mouth Rinse q12n4p  . chlorhexidine  15 mL Mouth Rinse BID  . heparin subcutaneous  5,000 Units Subcutaneous 3 times per day  . pantoprazole (PROTONIX) IV  40 mg Intravenous QHS  . piperacillin-tazobactam (ZOSYN)  IV  3.375 g Intravenous Q8H   Continuous Infusions: . dextrose 5 % and 0.45% NaCl 1,000 mL with potassium chloride 40 mEq infusion 125 mL/hr at 10/21/15 1234    Principal Problem:   Abdominal pain Active Problems:   Transient hypotension   Lactic acidosis   Obstruction (acquired) of bladder neck or vesicourethral orifice   SBO (small bowel obstruction) (HCC)   Acute renal failure (HCC)   Hypokalemia    Time spent: 25 MINS    Corliss Lamartina MD [facility] Pager [phone]. If 7PM-7AM, please contact night-coverage at [email or web address], password [facility] 10/21/2015, 7:03 PM  LOS: 3 days

## 2015-10-21 NOTE — Progress Notes (Signed)
[facility] Progress Note     Subjective: He doesn't think he's had any flatus.  No BM yet.  Clinically feels almost normal.  He's very hungry/thirsty.  No pain and less distension.  Wife at bedside says he's much better.  Hasn't been OOB.    Objective: Vital signs in last 24 hours: Temp:  [97.8 F (36.6 C)-98.1 F (36.7 C)] 97.8 F (36.6 C) (12/21 0854) Pulse Rate:  [68-91] 68 (12/21 0854) Resp:  [16-18] 18 (12/21 0854) BP: (91-109)/(43-58) 106/58 mmHg (12/21 0854) SpO2:  [95 %-98 %] 95 % (12/21 0854) Weight:  [64.5 kg (142 lb 3.2 oz)] 64.5 kg (142 lb 3.2 oz) (12/20 2008) Last BM Date: 10/13/15  Intake/Output from previous day: 12/20 0701 - 12/21 0700 In: 2170.8 [P.O.:120; I.V.:2050.8] Out: 2100 [Urine:1450; Emesis/NG output:650] Intake/Output this shift: Total I/O In: 60 [P.O.:60] Out: 400 [Emesis/NG output:400]  PE: Gen:  Alert, NAD, pleasant Abd: Soft, NT/ND, +BS, no HSM   Lab Results:   Recent Labs  10/20/15 0945 10/21/15 1030  WBC 13.5* 10.7*  HGB 11.4* 10.6*  HCT 33.9* 31.7*  PLT 180 201   BMET  Recent Labs  10/19/15 0228 10/20/15 0725  NA 142 146*  K 4.0 3.3*  CL 107 112*  CO2 23 20*  GLUCOSE 81 55*  BUN 68* 57*  CREATININE 2.42* 1.74*  CALCIUM 7.8* 7.9*   PT/INR No results for input(s): LABPROT, INR in the last 72 hours. CMP     Component Value Date/Time   NA 146* 10/20/2015 0725   K 3.3* 10/20/2015 0725   CL 112* 10/20/2015 0725   CO2 20* 10/20/2015 0725   GLUCOSE 55* 10/20/2015 0725   BUN 57* 10/20/2015 0725   CREATININE 1.74* 10/20/2015 0725   CALCIUM 7.9* 10/20/2015 0725   PROT 5.6* 10/18/2015 1024   ALBUMIN 3.0* 10/18/2015 1024   AST 81* 10/18/2015 1024   ALT 40 10/18/2015 1024   ALKPHOS 73 10/18/2015 1024   BILITOT 1.6* 10/18/2015 1024   GFRNONAA 36* 10/20/2015 0725   GFRAA 42* 10/20/2015 0725   Lipase  No results found for: LIPASE     Studies/Results: Dg Abd 1 View  10/20/2015  CLINICAL DATA:   Small bowel obstruction with diffuse abdominal pain, NG tube has been placed EXAM: ABDOMEN - 1 VIEW COMPARISON:  10/19/2015 FINDINGS: A dilated loop of mid abdomen small bowel is noted. There is minimal gas otherwise within the abdomen. An NG tube is seen with its tip over the anticipated position of the antrum of the stomach. IMPRESSION: Findings consistent with small-bowel obstruction. NG tube as described. Electronically Signed   By: Esperanza Heir M.D.   On: 10/20/2015 12:30   Dg Abd 2 Views  10/21/2015  CLINICAL DATA:  Follow-up small bowel obstruction, asymptomatic today EXAM: ABDOMEN - 2 VIEW COMPARISON:  Supine abdominal film dated October 20, 2015 FINDINGS: There are few loops of minimally distended gas-filled small bowel in the upper abdomen. The nasogastric tube lies in the distal aspect of the partially gas-filled stomach. There are stable calcifications projecting above the right iliac crest. There stable degenerative changes and dextrocurvature of the thoracolumbar spine. Stable severe degenerative changes of both hips are noted. The patient has undergone previous ORIF for subcapital fracture of the left hip. IMPRESSION: No significant obstructive pattern is observed today. There do remain a few loops of mildly distended gas and fluid-filled small bowel in the upper abdomen. Electronically Signed   By: David  [place] M.D.  On: 10/21/2015 08:08    Anti-infectives: Anti-infectives    Start     Dose/Rate Route Frequency Ordered Stop   10/19/15 1200  vancomycin (VANCOCIN) 500 mg in sodium chloride 0.9 % 100 mL IVPB  Status:  Discontinued     500 mg 100 mL/hr over 60 Minutes Intravenous Every 24 hours 10/18/15 1111 10/18/15 1258   10/18/15 1700  piperacillin-tazobactam (ZOSYN) IVPB 3.375 g     3.375 g 12.5 mL/hr over 240 Minutes Intravenous Every 8 hours 10/18/15 1313     10/18/15 1115  vancomycin (VANCOCIN) IVPB 1000 mg/200 mL premix     1,000 mg 200 mL/hr over 60 Minutes Intravenous   Once 10/18/15 1105 10/18/15 1246   10/18/15 1100  piperacillin-tazobactam (ZOSYN) IVPB 3.375 g     3.375 g 100 mL/hr over 30 Minutes Intravenous  Once 10/18/15 1057 10/18/15 1153       Assessment/Plan SBO, with thickening of single small bowel loop, mesenteric edema  -WBC down, afebrile, abdominal pain resolved, no N/V, NG output less and clearing up -SBO resolving clinically and on xray, clamp NG and start sips of clears, if tolerates clamping trial d/c NG and advance diet -Ambulate and IS -SCD's and heparin -Dulcolax -On Zosyn for sepsis - hopefully this can be discontinued  Hx of sigmoidectomy & inguinal hernia repairs Lumbar spine surgery 07/29/15, Dr. Shelle IronBeane Chronic constipation Last BM 6 days ago BPH Antibiotics: Zosyn day 4 DVT: SCD/Heparin     LOS: 3 days    Nonie HoyerMegan N Randell Detter 10/21/2015, 11:05 AM Pager: [phone]

## 2015-10-21 NOTE — Care Management Important Message (Signed)
Important Message  Patient Details  Name: Derek Bentley MRN: [id number] Date of Birth: 05/26/1936   Medicare Important Message Given:  Yes    Denise Washburn P Glynn Freas 10/21/2015, 3:32 PM

## 2015-10-22 MED ORDER — PANTOPRAZOLE SODIUM 40 MG PO TBEC
40.0000 mg | DELAYED_RELEASE_TABLET | Freq: Every day | ORAL | Status: DC
Start: 2015-10-22 — End: 2015-10-23
  Administered 2015-10-22: 40 mg via ORAL
  Filled 2015-10-22 (×2): qty 1

## 2015-10-22 MED ORDER — DEXTROSE 5 % IV SOLN
INTRAVENOUS | Status: DC
  Administered 2015-10-22: 21:00:00 via INTRAVENOUS

## 2015-10-22 MED ORDER — FLEET ENEMA 7-19 GM/118ML RE ENEM
1.0000 | ENEMA | Freq: Once | RECTAL | Status: AC
  Administered 2015-10-22: 1 via RECTAL
  Filled 2015-10-22: qty 1

## 2015-10-22 MED ORDER — BISACODYL 10 MG RE SUPP
10.0000 mg | Freq: Every day | RECTAL | Status: DC | PRN
  Administered 2015-10-22: 10 mg via RECTAL
  Filled 2015-10-22: qty 1

## 2015-10-22 NOTE — Progress Notes (Signed)
Physical Therapy Evaluation Patient Details Name: Derek Bentley MRN: [id number] DOB: Mar 18, 1936 Today's Date: 10/22/2015   History of Present Illness  79 y.o. male admitted with small bowel obstruction, hypokalemia, metabolic acidosis/Lactic acidosis,  acute renal failure, and hypotension.  Hx of arthritis, diverticulitis, and recent lumbar spine surgery  Clinical Impression  Pt admitted with the above complications. Pt currently with functional limitations due to the deficits listed below (see PT Problem List). Previously mobilizing at a modified independent level at home. Requires close guard for safety today but without loss of balance while using his rolling walker for support. Wife very supportive and able to adequately provide assistance based on today's evaluation. He recently finished a series of HHPT visits but reports he feels that he has lost much of what he gained from [facility] services. Would benefit from continued PT at home upon d/c. Pt will benefit from skilled PT to increase their independence and safety with mobility to allow discharge to the venue listed below.       Follow Up Recommendations Home health PT;Supervision for mobility/OOB    Equipment Recommendations  None recommended by PT    Recommendations for Other Services       Precautions / Restrictions Precautions Precautions: Fall Restrictions Weight Bearing Restrictions: No      Mobility  Bed Mobility Overal bed mobility: Needs Assistance Bed Mobility: Rolling;Sit to Sidelying Rolling: Min guard       Sit to sidelying: Min assist General bed mobility comments: Min assist for LE back into bed, guard for safety with roll. Educated on log roll technique due to recent back surgery.  Transfers Overall transfer level: Needs assistance Equipment used: Rolling walker (2 wheeled) Transfers: Sit to/from Stand Sit to Stand: Min assist         General transfer comment: Very slow to rise. VC for hand placement.  Min assist for boost from recliner.  Ambulation/Gait Ambulation/Gait assistance: Min guard Ambulation Distance (Feet): 70 Feet Assistive device: Rolling walker (2 wheeled) Gait Pattern/deviations: Step-through pattern;Decreased stride length;Trunk flexed Gait velocity: decreased Gait velocity interpretation: Below normal speed for age/gender General Gait Details: Educated on safe DME use with a rolling walker. VC for walker placement for proximity and upright posture.No overt loss of balance or buckling noted. Close guard for safety.  Stairs            Wheelchair Mobility    Modified Rankin (Stroke Patients Only)       Balance Overall balance assessment: Needs assistance Sitting-balance support: No upper extremity supported;Feet supported Sitting balance-Leahy Scale: Good     Standing balance support: Bilateral upper extremity supported Standing balance-Leahy Scale: Poor                               Pertinent Vitals/Pain Pain Assessment: Faces Faces Pain Scale: Hurts a little bit Pain Location: abdomen Pain Intervention(s): Monitored during session;Repositioned    Home Living Family/patient expects to be discharged to:: Private residence Living Arrangements: Spouse/significant other Available Help at Discharge: Family;Available 24 hours/day Type of Home: House Home Access: Stairs to enter Entrance Stairs-Rails: Right;Left;Can reach both Entrance Stairs-Number of Steps: 4 Home Layout: Able to live on main level with bedroom/bathroom Home Equipment: Walker - 2 wheels;Wheelchair - manual;Hospital bed;Shower seat;Walker - 4 wheels      Prior Function Level of Independence: Independent with assistive device(s)         Comments: rollator to ambulate in house  Hand Dominance   Dominant Hand: Right    Extremity/Trunk Assessment   Upper Extremity Assessment: Defer to OT evaluation           Lower Extremity Assessment: Generalized  weakness         Communication   Communication: No difficulties  Cognition Arousal/Alertness: Awake/alert Behavior During Therapy: WFL for tasks assessed/performed Overall Cognitive Status: Within Functional Limits for tasks assessed                      General Comments General comments (skin integrity, edema, etc.): wife present very supportive.    Exercises General Exercises - Lower Extremity Ankle Circles/Pumps: AROM;Both;10 reps;Supine Hip Flexion/Marching: Strengthening;Both;5 reps;Supine      Assessment/Plan    PT Assessment Patient needs continued PT services  PT Diagnosis Difficulty walking;Abnormality of gait;Generalized weakness   PT Problem List Decreased strength;Decreased range of motion;Decreased activity tolerance;Decreased balance;Decreased mobility;Decreased knowledge of use of DME;Pain  PT Treatment Interventions DME instruction;Gait training;Stair training;Functional mobility training;Therapeutic activities;Therapeutic exercise;Balance training;Neuromuscular re-education;Patient/family education   PT Goals (Current goals can be found in the Care Plan section) Acute Rehab PT Goals Patient Stated Goal: go home PT Goal Formulation: With patient/family Time For Goal Achievement: 11/05/15 Potential to Achieve Goals: Good    Frequency Min 3X/week   Barriers to discharge        Co-evaluation               End of Session   Activity Tolerance: Patient tolerated treatment well Patient left: in bed;with call bell/phone within reach;with bed alarm set;with family/visitor present;with SCD's reapplied Nurse Communication: Mobility status         Time: [id number] PT Time Calculation (min) (ACUTE ONLY): 26 min   Charges:   PT Evaluation $Initial PT Evaluation Tier I: 1 Procedure PT Treatments $Gait Training: 8-22 mins   PT G CodesBerton Mount:        Yaira Bernardi S 10/22/2015, 12:20 PM  Charlsie MerlesLogan Secor Julie-Anne Torain, [place] CarolinaPT [phone]

## 2015-10-22 NOTE — Progress Notes (Signed)
Subjective: He is happy to have NG out and did walk some.  1 BM.  He doesn't like the clear liquids.  No other complaint..  He still has a condom cath on.  Objective: Vital signs in last 24 hours: Temp:  [97.4 F (36.3 C)-98.1 F (36.7 C)] 97.5 F (36.4 C) (12/22 0821) Pulse Rate:  [71-72] 72 (12/22 0821) Resp:  [17-18] 17 (12/22 0821) BP: (95-120)/(52-64) 103/52 mmHg (12/22 0821) SpO2:  [94 %-98 %] 97 % (12/22 0821) Weight:  [63 kg (138 lb 14.2 oz)] 63 kg (138 lb 14.2 oz) 11-Nov-2023 2003) Last BM Date: 11-11-15 We pulled NG last night and gave him some clears from the floor, today he is starting clears from dietary. BM x 1 recorded last PM Afebrile, VSS Na 149, CL 117, K+ 4.1 WBC is coming down Intake/Output from previous day: 11/11/2023 0701 - 12/22 0700 In: 100 [P.O.:100] Out: 900 [Urine:300; Emesis/NG output:600] Intake/Output this shift: Total I/O In: 120 [P.O.:120] Out: -   General appearance: alert, cooperative and no distress GI: soft, non-tender; bowel sounds normal; no masses,  no organomegaly  Lab Results:   Recent Labs  10/20/15 0945 November 11, 2015 1030  WBC 13.5* 10.7*  HGB 11.4* 10.6*  HCT 33.9* 31.7*  PLT 180 201    BMET  Recent Labs  10/20/15 0725 2015/11/11 1030  NA 146* 149*  K 3.3* 4.1  CL 112* 117*  CO2 20* 26  GLUCOSE 55* 107*  BUN 57* 31*  CREATININE 1.74* 1.08  CALCIUM 7.9* 7.7*   PT/INR No results for input(s): LABPROT, INR in the last 72 hours.   Recent Labs Lab 10/18/15 1024  AST 81*  ALT 40  ALKPHOS 73  BILITOT 1.6*  PROT 5.6*  ALBUMIN 3.0*     Lipase  No results found for: LIPASE   Studies/Results: Dg Abd 2 Views  11-11-15  CLINICAL DATA:  Follow-up small bowel obstruction, asymptomatic today EXAM: ABDOMEN - 2 VIEW COMPARISON:  Supine abdominal film dated October 20, 2015 FINDINGS: There are few loops of minimally distended gas-filled small bowel in the upper abdomen. The nasogastric tube lies in the distal aspect of  the partially gas-filled stomach. There are stable calcifications projecting above the right iliac crest. There stable degenerative changes and dextrocurvature of the thoracolumbar spine. Stable severe degenerative changes of both hips are noted. The patient has undergone previous ORIF for subcapital fracture of the left hip. IMPRESSION: No significant obstructive pattern is observed today. There do remain a few loops of mildly distended gas and fluid-filled small bowel in the upper abdomen. Electronically Signed   By: David  [place] M.D.   On: 11-11-2015 08:08    Medications: . antiseptic oral rinse  7 mL Mouth Rinse q12n4p  . chlorhexidine  15 mL Mouth Rinse BID  . heparin subcutaneous  5,000 Units Subcutaneous 3 times per day  . pantoprazole (PROTONIX) IV  40 mg Intravenous QHS  . piperacillin-tazobactam (ZOSYN)  IV  3.375 g Intravenous Q8H   . dextrose 5 % and 0.45% NaCl 1,000 mL with potassium chloride 40 mEq infusion 125 mL/hr at 11-Nov-2015 2254   Assessment/Plan SBO, with thickening of single small bowel loop, mesenteric edema  Hx of sigmoidectomy & inguinal hernia repairs Lumbar spine surgery 07/29/15, DR. Beane Chronic constipation Last BM 6 days ago BPH Antibiotics: Zosyn day 5 DVT: SCD/Heparin     Plan:  If he does well with clears at lunch I told he we would go to full liquids at dinner.  I told him to get rid of the condom cath and walk to BR and in halls.  Once he is tolerating fulls he can go home.   I am going to cut down his IV fluids.   Recheck labs in AM.    LOS: 4 days    Sharyn Brilliant 10/22/2015

## 2015-10-22 NOTE — Progress Notes (Signed)
[facility] PROGRESS NOTE  Derek Bentley ZOX:[id number] DOB: 04/16/1936 DOA: 10/18/2015 PCP: Thayer HeadingsMACKENZIE,BRIAN, MD  Assessment/Plan: #1 small bowel obstruction Likely secondary to adhesions. Patient still with no improvement with a small bowel obstruction.  NG tube in place. Continue empiric IV Zosyn. Keep potassium around 4. Keep magnesium greater than 2. Mobilize. Per general surgery if no significant improvement in the next 1-2 days will likely need expiratory laparotomy for further evaluation and management.  his ABD film shows resolving SBO, NG clamped and sips of water given on 12/21. Started on clears today and advance as tolerated. Supportive care. Per general surgery.  #2 acute renal failure Likely secondary to a prerenal azotemia. Renal function slowly improving.  Follow. Improved.  #3 hypokalemia Replete.  #4 hypotension Secondary to volume depletion and possible infectious etiology. Improved with IV fluids. Patient has been pancultured. Continue Zosyn. Follow.  #5 metabolic acidosis/Lactic acidosis Improved  #6 leukocytosis Questionable etiology. Possible abdominal source. WBC trending down. Patient has been pancultured cultures are pending. Continue empiric IV Zosyn. Follow.  #7 prophylaxis PPI for GI prophylaxis. Heparin for DVT prophylaxis. Constipation: Prn dulcolax ordered.   Code Status: Full Family Communication: Updated patient and wife at bedside. Disposition Plan: Home once small bowel obstruction has resolved and per general surgery.   Consultants:  PCCM admit.  General Surgery:Dr Kinsinger 10/18/2015  Procedures:  CT abdomen and pelvis 10/18/2015>>>> small bowel obstruction with significant bowel wall thickening of small bowel loops in the midabdomen with mesenteric edema suspicious for ischemic small bowel.  Abdominal films 10/18/2015, 10/19/2015, 10/20/2015.  Chest x-ray 10/18/2015  Antibiotics:  IV Zosyn  10/18/2015  HPI/Subjective: Patient denies any abdominal pain. No nausea. No emesis. PT eval ordered. Improving   Objective: Filed Vitals:   10/22/15 0821 10/22/15 1538  BP: 103/52 108/65  Pulse: 72 77  Temp: 97.5 F (36.4 C) 98.1 F (36.7 C)  Resp: 17 16    Intake/Output Summary (Last 24 hours) at 10/22/15 1908 Last data filed at 10/22/15 1810  Gross per 24 hour  Intake    720 ml  Output   1050 ml  Net   -330 ml   Filed Weights   10/19/15 1652 10/20/15 2008 10/21/15 2003  Weight: 67.268 kg (148 lb 4.8 oz) 64.5 kg (142 lb 3.2 oz) 63 kg (138 lb 14.2 oz)    Exam:   General:  NAD. NGT in place.  Cardiovascular: RRR  Respiratory: CTAB  Abdomen: Soft, nontender, nondistended, hypoactive bowel sounds.  Musculoskeletal: No clubbing cyanosis or edema.  Data Reviewed: Basic Metabolic Panel:  Recent Labs Lab 10/18/15 1024 10/18/15 1037 10/19/15 0228 10/20/15 0725 10/20/15 0945 10/21/15 1030  NA 141 140 142 146*  --  149*  K 3.1* 3.0* 4.0 3.3*  --  4.1  CL 101 101 107 112*  --  117*  CO2 15*  --  23 20*  --  26  GLUCOSE 167* 161* 81 55*  --  107*  BUN 56* 58* 68* 57*  --  31*  CREATININE 2.58* 2.10* 2.42* 1.74*  --  1.08  CALCIUM 9.0  --  7.8* 7.9*  --  7.7*  MG  --   --   --   --  2.7* 2.6*   Liver Function Tests:  Recent Labs Lab 10/18/15 1024  AST 81*  ALT 40  ALKPHOS 73  BILITOT 1.6*  PROT 5.6*  ALBUMIN 3.0*   No results for input(s): LIPASE, AMYLASE in the last 168 hours. No results for  input(s): AMMONIA in the last 168 hours. CBC:  Recent Labs Lab 10/18/15 1024 10/18/15 1037 10/19/15 0228 10/20/15 0945 10/21/15 1030  WBC 17.9*  --  18.0* 13.5* 10.7*  NEUTROABS 14.9*  --   --  10.2*  --   HGB 15.0 15.6 14.0 11.4* 10.6*  HCT 45.7 46.0 39.3 33.9* 31.7*  MCV 101.3*  --  97.5 100.0 100.6*  PLT 196  --  145* 180 201   Cardiac Enzymes:  Recent Labs Lab 10/18/15 1024  TROPONINI 0.07*   BNP (last 3 results)  Recent Labs   07/30/15 0530  BNP 299.2*    ProBNP (last 3 results) No results for input(s): PROBNP in the last 8760 hours.  CBG:  Recent Labs Lab 10/18/15 1516  GLUCAP 113*    Recent Results (from the past 240 hour(s))  Culture, blood (Routine X 2) w Reflex to ID Panel     Status: None (Preliminary result)   Collection Time: 10/18/15 10:25 AM  Result Value Ref Range Status   Specimen Description BLOOD RIGHT ANTECUBITAL  Final   Special Requests BOTTLES DRAWN AEROBIC ONLY 5CC  Final   Culture NO GROWTH 4 DAYS  Final   Report Status PENDING  Incomplete  Culture, blood (Routine X 2) w Reflex to ID Panel     Status: None (Preliminary result)   Collection Time: 10/18/15 11:10 AM  Result Value Ref Range Status   Specimen Description BLOOD RIGHT HAND  Final   Special Requests BOTTLES DRAWN AEROBIC AND ANAEROBIC 5CC  Final   Culture NO GROWTH 4 DAYS  Final   Report Status PENDING  Incomplete  MRSA PCR Screening     Status: None   Collection Time: 10/18/15  3:21 PM  Result Value Ref Range Status   MRSA by PCR NEGATIVE NEGATIVE Final    Comment:        The GeneXpert MRSA Assay (FDA approved for NASAL specimens only), is one component of a comprehensive MRSA colonization surveillance program. It is not intended to diagnose MRSA infection nor to guide or monitor treatment for MRSA infections.   Urine culture     Status: None   Collection Time: 10/20/15  6:11 PM  Result Value Ref Range Status   Specimen Description URINE, RANDOM  Final   Special Requests NONE  Final   Culture NO GROWTH 1 DAY  Final   Report Status 10/21/2015 FINAL  Final     Studies: Dg Abd 2 Views  10/21/2015  CLINICAL DATA:  Follow-up small bowel obstruction, asymptomatic today EXAM: ABDOMEN - 2 VIEW COMPARISON:  Supine abdominal film dated October 20, 2015 FINDINGS: There are few loops of minimally distended gas-filled small bowel in the upper abdomen. The nasogastric tube lies in the distal aspect of the partially  gas-filled stomach. There are stable calcifications projecting above the right iliac crest. There stable degenerative changes and dextrocurvature of the thoracolumbar spine. Stable severe degenerative changes of both hips are noted. The patient has undergone previous ORIF for subcapital fracture of the left hip. IMPRESSION: No significant obstructive pattern is observed today. There do remain a few loops of mildly distended gas and fluid-filled small bowel in the upper abdomen. Electronically Signed   By: David  [place] M.D.   On: 10/21/2015 08:08    Scheduled Meds: . antiseptic oral rinse  7 mL Mouth Rinse q12n4p  . chlorhexidine  15 mL Mouth Rinse BID  . heparin subcutaneous  5,000 Units Subcutaneous 3 times per day  .  pantoprazole  40 mg Oral Q0600  . piperacillin-tazobactam (ZOSYN)  IV  3.375 g Intravenous Q8H   Continuous Infusions:    Principal Problem:   Abdominal pain Active Problems:   Transient hypotension   Lactic acidosis   Obstruction (acquired) of bladder neck or vesicourethral orifice   SBO (small bowel obstruction) (HCC)   Acute renal failure (HCC)   Hypokalemia    Time spent: 25 MINS    Kambre Messner MD [facility] Pager [phone]. If 7PM-7AM, please contact night-coverage at [email or web address], password [facility] 10/22/2015, 7:08 PM  LOS: 4 days

## 2015-10-22 NOTE — Progress Notes (Signed)
Occupational Therapy Evaluation Patient Details Name: Derek Bentley MRN: [id number] DOB: 12-13-1935 Today's Date: 10/22/2015    History of Present Illness 79 y.o. male admitted with small bowel obstruction, hypokalemia, metabolic acidosis/Lactic acidosis,  acute renal failure, and hypotension.  Hx of arthritis, diverticulitis, and recent lumbar spine surgery   Clinical Impression   PTA, wife assisted pt with ADL as needed. Feel pt is close to baseline level with ADL and functional mobility for ADL. Educated pt and wife on home safety and reducing risk of falls. Pt will be safe to D/C home with 24/7 S of wife when medically stable. Recommend pt ambulate with staff with RW and increase time OOB. OT signing off.     Follow Up Recommendations  No OT follow up;Supervision/Assistance - 24 hour    Equipment Recommendations  None recommended by OT    Recommendations for Other Services       Precautions / Restrictions Precautions Precautions: Fall Restrictions Weight Bearing Restrictions: No      Mobility Bed Mobility Overal bed mobility: Needs Assistance Bed Mobility: Rolling;Sit to Sidelying Rolling: Supervision       Sit to sidelying: Supervision General bed mobility comments: improved performance after initial Pt session this am  Transfers Overall transfer level: Needs assistance Equipment used: Rolling walker (2 wheeled) Transfers: Sit to/from Stand Sit to Stand: Supervision         General transfer comment: vcfor hand placement. Wife states " I always have to tell him how to do that. his memory is not the greatest"    Balance Overall balance assessment: Needs assistance Sitting-balance support: No upper extremity supported;Feet supported Sitting balance-Leahy Scale: Good     Standing balance support: Bilateral upper extremity supported Standing balance-Leahy Scale: Poor                              ADL Overall ADL's : At baseline                                       General ADL Comments: wife assists husband with ADL at baseline. Pt lives on main floor and takes "bird baths". Wife feels that her husband is close to his baseline regarding his ADL. Encouraged pt to continue to ambulate with staff and spend more time OOB.     Vision     Perception     Praxis      Pertinent Vitals/Pain Pain Assessment: Faces Faces Pain Scale: Hurts a little bit Pain Location: abdomen Pain Descriptors / Indicators: Discomfort Pain Intervention(s): Limited activity within patient's tolerance     Hand Dominance Right   Extremity/Trunk Assessment Upper Extremity Assessment Upper Extremity Assessment: Overall WFL for tasks assessed   Lower Extremity Assessment Lower Extremity Assessment: Defer to PT evaluation   Cervical / Trunk Assessment Cervical / Trunk Assessment: Kyphotic (recent back surgery)   Communication Communication Communication: No difficulties   Cognition Arousal/Alertness: Awake/alert Behavior During Therapy: WFL for tasks assessed/performed Overall Cognitive Status: History of cognitive impairments - at baseline (wife states her husband has "memory problems" at baseline)                     General Comments       Exercises Exercises: General Lower Extremity     Shoulder Instructions      Home Living Family/patient expects to  be discharged to:: Private residence Living Arrangements: Spouse/significant other Available Help at Discharge: Family;Available 24 hours/day Type of Home: House Home Access: Stairs to enter Entergy CorporationEntrance Stairs-Number of Steps: 4 Entrance Stairs-Rails: Right;Left;Can reach both Home Layout: Able to live on main level with bedroom/bathroom     Bathroom Shower/Tub: Walk-in shower;Door   Foot LockerBathroom Toilet: Standard     Home Equipment: Environmental consultantWalker - 2 wheels;Wheelchair - manual;Hospital bed;Shower seat;Walker - 4 wheels          Prior Functioning/Environment  Level of Independence: Independent with assistive device(s)        Comments: rollator to ambulate in house    OT Diagnosis: Generalized weakness;Acute pain   OT Problem List: Decreased strength;Decreased activity tolerance;Pain   OT Treatment/Interventions:      OT Goals(Current goals can be found in the care plan section) Acute Rehab OT Goals Patient Stated Goal: go home tomorrow OT Goal Formulation: All assessment and education complete, DC therapy  OT Frequency:     Barriers to D/C:            Co-evaluation              End of Session Equipment Utilized During Treatment: Gait belt;Rolling walker Nurse Communication: Mobility status  Activity Tolerance: Patient tolerated treatment well Patient left: in bed;with call bell/phone within reach;with bed alarm set;with family/visitor present   Time: [id number] OT Time Calculation (min): 17 min Charges:  OT General Charges $OT Visit: 1 Procedure OT Evaluation $Initial OT Evaluation Tier I: 1 Procedure G-Codes:    Afreen Siebels,HILLARY 10/22/2015, 1:32 PM   Luisa DagoHilary Diesha Rostad, OTR/L  [phone] 10/22/2015

## 2015-10-23 LAB — CULTURE, BLOOD (ROUTINE X 2)
CULTURE: NO GROWTH
Culture: NO GROWTH

## 2015-10-23 LAB — BASIC METABOLIC PANEL
Anion gap: 5 (ref 5–15)
BUN: 11 mg/dL (ref 6–20)
CALCIUM: 7.6 mg/dL — AB (ref 8.9–10.3)
CO2: 23 mmol/L (ref 22–32)
Chloride: 114 mmol/L — ABNORMAL HIGH (ref 101–111)
Creatinine, Ser: 0.96 mg/dL (ref 0.61–1.24)
GFR calc Af Amer: >60 mL/min (ref >60)
GLUCOSE: 85 mg/dL (ref 65–99)
Potassium: 4.3 mmol/L (ref 3.5–5.1)
Sodium: 142 mmol/L (ref 135–145)

## 2015-10-23 LAB — CBC
HEMATOCRIT: 31.8 % — AB (ref 39.0–52.0)
Hemoglobin: 10.3 g/dL — ABNORMAL LOW (ref 13.0–17.0)
MCH: 32.4 pg (ref 26.0–34.0)
MCHC: 32.4 g/dL (ref 30.0–36.0)
MCV: 100 fL (ref 78.0–100.0)
PLATELETS: 240 10*3/uL (ref 150–400)
RBC: 3.18 MIL/uL — ABNORMAL LOW (ref 4.22–5.81)
RDW: 13.2 % (ref 11.5–15.5)
WBC: 9.5 10*3/uL (ref 4.0–10.5)

## 2015-10-23 MED ORDER — BISACODYL 10 MG RE SUPP: 10.0000 mg | Freq: Every day | RECTAL | Status: DC | PRN

## 2015-10-23 MED ORDER — DOCUSATE SODIUM 100 MG PO CAPS: 100.0000 mg | ORAL_CAPSULE | Freq: Two times a day (BID) | ORAL | Status: DC | PRN

## 2015-10-23 MED ORDER — SENNA 8.6 MG PO TABS: 1.0000 | ORAL_TABLET | Freq: Every day | ORAL | Status: DC | PRN

## 2015-10-23 NOTE — Progress Notes (Signed)
Physical Therapy Treatment Patient Details Name: Clarice PoleJulius G Bonner MRN: [id number] DOB: 08/09/1936 Today's Date: 10/23/2015    History of Present Illness 79 y.o. male admitted with small bowel obstruction, hypokalemia, metabolic acidosis/Lactic acidosis,  acute renal failure, and hypotension.  Hx of arthritis, diverticulitis, and recent lumbar spine surgery    PT Comments    Very attentive spouse in room who assisted during session.  Amb with RW twice needing one sitting rest break.    Follow Up Recommendations  Home health PT;Supervision for mobility/OOB     Equipment Recommendations  None recommended by PT    Recommendations for Other Services       Precautions / Restrictions Precautions Precautions: Fall Restrictions Weight Bearing Restrictions: No    Mobility  Bed Mobility               General bed mobility comments: Pt OOB in recliner  Transfers Overall transfer level: Needs assistance Equipment used: Rolling walker (2 wheeled) Transfers: Sit to/from BJ'sStand;Stand Pivot Transfers Sit to Stand: Supervision Stand pivot transfers: Supervision       General transfer comment: increased time and one VC hand placement with stand to sit to control decend  Ambulation/Gait Ambulation/Gait assistance: Min guard;Supervision Ambulation Distance (Feet): 140 Feet (70 feet x 2 one sitting rest break) Assistive device: Rolling walker (2 wheeled) Gait Pattern/deviations: Step-through pattern;Decreased stride length;Trunk flexed Gait velocity: decreased   General Gait Details: increased time and 25% VC's safety with turns and negociating around obsticles.  Poor forward flex posture.  Slow.     Stairs            Wheelchair Mobility    Modified Rankin (Stroke Patients Only)       Balance                                    Cognition Arousal/Alertness: Awake/alert Behavior During Therapy: WFL for tasks assessed/performed Overall Cognitive  Status: History of cognitive impairments - at baseline                      Exercises      General Comments        Pertinent Vitals/Pain Pain Assessment: Faces Faces Pain Scale: Hurts little more Pain Location: ABd Pain Descriptors / Indicators: Sore;Tightness Pain Intervention(s): Monitored during session;Repositioned    Home Living                      Prior Function            PT Goals (current goals can now be found in the care plan section) Progress towards PT goals: Progressing toward goals    Frequency  Min 3X/week    PT Plan Current plan remains appropriate    Co-evaluation             End of Session Equipment Utilized During Treatment: Gait belt Activity Tolerance: Patient tolerated treatment well Patient left: in chair;with call bell/phone within reach;with family/visitor present     Time: [id number] PT Time Calculation (min) (ACUTE ONLY): 24 min  Charges:  $Gait Training: 8-22 mins $Therapeutic Activity: 8-22 mins                    G Codes:      Armando ReichertKropski, Daden Mahany Ann 10/23/2015, 12:45 PM

## 2015-10-23 NOTE — Care Management Note (Signed)
Case Management Note  Patient Details  Name: Derek Bentley MRN: [id number] Date of Birth: 07/08/1936  Subjective/Objective:            CM following for d/c needs.        Action/Plan: 10/23/2015 Note PT and OT recommending HH services. [facility] and [facility] to use his past therapist, Deniece PortelaWayne. This CM notified MD of need for orders and [facility] notified of PT/OT recommendations. As pt is no longer active with [facility], new orders are needed. This CM has requested [facility] to resume services and asked MD to order [facility] services.   Expected Discharge Date:     10/23/2015             Expected Discharge Plan:  Home w Home Health Services  In-House Referral:  NA  Discharge planning Services  CM Consult  Post Acute Care Choice:  NA Choice offered to:  NA  DME Arranged:  N/A DME Agency:  NA  HH Arranged:  PT, OT HH Agency:  [facility]  Status of Service:  Completed, signed off  Medicare Important Message Given:  Yes Date Medicare IM Given:    Medicare IM give by:    Date Additional Medicare IM Given:    Additional Medicare Important Message give by:     If discussed at Long Length of Stay Meetings, dates discussed:    Additional Comments:  Starlyn SkeansRoyal, Lavayah Vita U, RN 10/23/2015, 4:33 PM

## 2015-10-23 NOTE — Progress Notes (Signed)
[facility] Progress Note     Subjective: Pt feels great.  Had an enema yesterday which helped with a good BM.  No abdominal pain or distension.  Tolerated fulls this am.  Pending solid diet for lunch right now.  Mobilizing better.  No concerns.  Wife and patient thankful for our care.  He really wants to go home today.    Objective: Vital signs in last 24 hours: Temp:  [97.8 F (36.6 C)-98.3 F (36.8 C)] 98.2 F (36.8 C) (12/23 0755) Pulse Rate:  [76-83] 79 (12/23 0755) Resp:  [16-18] 18 (12/23 0755) BP: (99-108)/(52-65) 100/64 mmHg (12/23 0755) SpO2:  [95 %-100 %] 96 % (12/23 0755) Weight:  [63.3 kg (139 lb 8.8 oz)] 63.3 kg (139 lb 8.8 oz) (12/23 0507) Last BM Date: 10/21/15  Intake/Output from previous day: 12/22 0701 - 12/23 0700 In: 1363.3 [P.O.:720; I.V.:443.3; IV Piggyback:200] Out: 1400 [Urine:1400] Intake/Output this shift: Total I/O In: 240 [P.O.:240] Out: 300 [Urine:300]  PE: Gen:  Alert, NAD, pleasant Abd: Soft, NT/ND, +BS, no HSM   Lab Results:   Recent Labs  10/21/15 1030 10/23/15 0546  WBC 10.7* 9.5  HGB 10.6* 10.3*  HCT 31.7* 31.8*  PLT 201 240   BMET  Recent Labs  10/21/15 1030 10/23/15 0546  NA 149* 142  K 4.1 4.3  CL 117* 114*  CO2 26 23  GLUCOSE 107* 85  BUN 31* 11  CREATININE 1.08 0.96  CALCIUM 7.7* 7.6*   PT/INR No results for input(s): LABPROT, INR in the last 72 hours. CMP     Component Value Date/Time   NA 142 10/23/2015 0546   K 4.3 10/23/2015 0546   CL 114* 10/23/2015 0546   CO2 23 10/23/2015 0546   GLUCOSE 85 10/23/2015 0546   BUN 11 10/23/2015 0546   CREATININE 0.96 10/23/2015 0546   CALCIUM 7.6* 10/23/2015 0546   PROT 5.6* 10/18/2015 1024   ALBUMIN 3.0* 10/18/2015 1024   AST 81* 10/18/2015 1024   ALT 40 10/18/2015 1024   ALKPHOS 73 10/18/2015 1024   BILITOT 1.6* 10/18/2015 1024   GFRNONAA >60 10/23/2015 0546   GFRAA >60 10/23/2015 0546   Lipase  No results found for:  LIPASE     Studies/Results: No results found.  Anti-infectives: Anti-infectives    Start     Dose/Rate Route Frequency Ordered Stop   10/19/15 1200  vancomycin (VANCOCIN) 500 mg in sodium chloride 0.9 % 100 mL IVPB  Status:  Discontinued     500 mg 100 mL/hr over 60 Minutes Intravenous Every 24 hours 10/18/15 1111 10/18/15 1258   10/18/15 1700  piperacillin-tazobactam (ZOSYN) IVPB 3.375 g     3.375 g 12.5 mL/hr over 240 Minutes Intravenous Every 8 hours 10/18/15 1313     10/18/15 1115  vancomycin (VANCOCIN) IVPB 1000 mg/200 mL premix     1,000 mg 200 mL/hr over 60 Minutes Intravenous  Once 10/18/15 1105 10/18/15 1246   10/18/15 1100  piperacillin-tazobactam (ZOSYN) IVPB 3.375 g     3.375 g 100 mL/hr over 30 Minutes Intravenous  Once 10/18/15 1057 10/18/15 1153       Assessment/Plan SBO, with thickening of single small bowel loop, mesenteric edema  -D/c home after tolerating solid diet -Discussed use of bowel regimen, good hydration, soft diet, smaller more frequent meals, mobilizing/ambulating -No need to follow up with [place] at this time -Should probably go home with a bowel regimen like colace, miralax, and dulcolax prn  Hx of sigmoidectomy & inguinal  hernia repairs Lumbar spine surgery 07/29/15, Dr. Shelle IronBeane Chronic constipation BPH Antibiotics: Zosyn day 6-can be discontinued DVT: SCD/Heparin     LOS: 5 days    Nonie HoyerMegan N Rembert Browe 10/23/2015, 11:59 AM Pager: [phone]

## 2015-10-26 NOTE — Discharge Summary (Addendum)
Physician Discharge Summary  Derek Bentley ZOX:[id number] DOB: Nov 26, 1935 DOA: 10/18/2015  PCP: Thayer Headings, MD  Admit date: 10/18/2015 Discharge date: 10/23/2015  Time spent: 30  minutes  Recommendations for Outpatient Follow-up:  1. Follow up w ith PCP in one week.  2. Follow up with surgery as needed.    Discharge Diagnoses:  Principal Problem:   Abdominal pain Active Problems:   Transient hypotension   Lactic acidosis   Obstruction (acquired) of bladder neck or vesicourethral orifice   SBO (small bowel obstruction) (HCC)   Acute renal failure (HCC)   Hypokalemia sepsis ruled out.   Discharge Condition: improved  Diet recommendation: soft , fiber diet.   Filed Weights   10/20/15 2008 10/21/15 2003 10/23/15 0507  Weight: 64.5 kg (142 lb 3.2 oz) 63 kg (138 lb 14.2 oz) 63.3 kg (139 lb 8.8 oz)    History of present illness:  78yo male with hx arthritis, hx diverticulitis, recent lumbar spine surgery presents 12/18 with 5 day hx abd pain, vomiting with intermittent coffee ground emesis, he was found to have SBO and lactic acidosis.   Hospital Course:  #1 small bowel obstruction Likely secondary to adhesions. Patient still with no improvement with a small bowel obstruction. NG tube in place. Continue empiric IV Zosyn. Keep potassium around 4. Keep magnesium greater than 2. Mobilize.  his ABD film shows resolving SBO, NG clamped and sips of water given on 12/21. Started on clears today and advance as tolerated. Supportive care. He was able to tolerate regular diet.   #2 acute renal failure Likely secondary to a prerenal azotemia. Renal function slowly improving. Follow. Improved.  #3 hypokalemia Repleted.  #4 hypotension Secondary to volume depletion and possible infectious etiology.resolved  With hydration.  #5 metabolic acidosis/Lactic acidosis Resolved.   #6 leukocytosis Unclear etiology, completed 5 days of IV zosyn, cultures have been negative.     Constipation: Prn dulcolax ordered.   Procedures:NG tube placement.   Consultations:  Surgery.  Discharge Exam: Filed Vitals:   10/23/15 0507 10/23/15 0755  BP: 99/52 100/64  Pulse: 83 79  Temp: 98.3 F (36.8 C) 98.2 F (36.8 C)  Resp: 18 18    General: alert afebrile comfortable.  Cardiovascular: s1s2 Respiratory: ctab  Discharge Instructions   Discharge Instructions    Discharge instructions    Complete by:  As directed   Follow up with PCP in one to two weeks, please take soft diet for a few days, small meals multiple times a day.  Stool softeners as needed.     Increase activity slowly    Complete by:  As directed           Discharge Medication List as of 10/23/2015  2:01 PM    START taking these medications   Details  bisacodyl (DULCOLAX) 10 MG suppository Place 1 suppository (10 mg total) rectally daily as needed for moderate constipation., Starting 10/23/2015, Until Discontinued, Print    senna (SENOKOT) 8.6 MG TABS tablet Take 1 tablet (8.6 mg total) by mouth daily as needed for mild constipation., Starting 10/23/2015, Until Discontinued, Print      CONTINUE these medications which have CHANGED   Details  docusate sodium (COLACE) 100 MG capsule Take 1 capsule (100 mg total) by mouth 2 (two) times daily as needed for mild constipation., Starting 10/23/2015, Until Discontinued, Print      CONTINUE these medications which have NOT CHANGED   Details  ascorbic acid (VITAMIN C) 500 MG tablet Take 500 mg by  mouth daily., Until Discontinued, Historical Med    calcium-vitamin D (OSCAL WITH D) 500-200 MG-UNIT per tablet Take 1 tablet by mouth daily with breakfast., Until Discontinued, Historical Med    Cyanocobalamin (VITAMIN B 12 PO) Take 1 tablet by mouth daily., Until Discontinued, Historical Med    FOLIC ACID PO Take 1 tablet by mouth daily., Until Discontinued, Historical Med    MAGNESIUM PO Take 1 tablet by mouth daily., Until Discontinued,  Historical Med    Multiple Vitamin (MULTIVITAMIN WITH MINERALS) TABS tablet Take 1 tablet by mouth daily., Until Discontinued, Historical Med    tamsulosin (FLOMAX) 0.4 MG CAPS capsule Take 1 capsule (0.4 mg total) by mouth daily after supper., Starting 07/31/2015, Until Discontinued, Print      STOP taking these medications     Ibuprofen-Diphenhydramine HCl (ADVIL PM) 200-25 MG CAPS      naproxen sodium (ANAPROX) 220 MG tablet      HYDROcodone-acetaminophen (NORCO/VICODIN) 5-325 MG tablet        No Known Allergies    The results of significant diagnostics from this hospitalization (including imaging, microbiology, ancillary and laboratory) are listed below for reference.    Significant Diagnostic Studies: Ct Abdomen Pelvis Wo Contrast  10/18/2015  CLINICAL DATA:  Lower abdominal pain, elevated lactic acidosis, sepsis EXAM: CT ABDOMEN AND PELVIS WITHOUT CONTRAST TECHNIQUE: Multidetector CT imaging of the abdomen and pelvis was performed following the standard protocol without IV contrast. Sagittal and coronal MPR images reconstructed from axial data set. Oral contrast not administered. COMPARISON:  None. FINDINGS: Dependent atelectasis LEFT lower lobe. Scattered atherosclerotic calcification aorta and coronary arteries. Beam hardening artifacts traverse upper abdomen secondary to patient's arms within imaged field. BILATERAL renal cysts. Numerous gallstones within a potential porcelain gallbladder. Slightly irregular/ nodular hepatic margins raising question of cirrhosis. No focal mass lesions of the liver, spleen, pancreas, or adrenal glands. No hydronephrosis or ureteral dilatation. Stomach distended small hiatal hernia. Significant dilatation of proximal small bowel loops with decompressed distal small bowel loops compatible with obstruction. Several of the dilated small bowel loops proximally show wall thickening, with significant thickening of a single small bowel loop in the mid  abdomen, raising question of small bowel ischemia. On coronal images, a suggestion of swirling of the mesentery is seen though this is not definitely confirmed on the axial images. Significant mesenteric edema. Distal small bowel and colon decompressed without wall thickening. Scattered ascites. Decompressed urinary bladder with minimal prostatic enlargement. No mass, adenopathy, or free air. Bones demineralized with pins at proximal RIGHT femur. Anterior compression deformity of T12 with Schmorl's node formation, appears old. Degenerative disc disease changes thoracolumbar spine. IMPRESSION: Dilated stomach and proximal small bowel loops with decompressed distal small bowel and colon compatible with small bowel obstruction. Significant bowel wall thickening of small bowel loops in the mid abdomen with mesenteric edema suspicious for ischemic small bowel. Suggestion of swirling of the mesentery on coronal images seen is seen which can be seen with internal hernia or small bowel volvulus though this is not seen on axial images. Findings called to Dr. Fayrene Fearing on 10/18/2015 at 1352 hours. Electronically Signed   By: Ulyses Southward M.D.   On: 10/18/2015 13:54   Dg Abd 1 View  10/20/2015  CLINICAL DATA:  Small bowel obstruction with diffuse abdominal pain, NG tube has been placed EXAM: ABDOMEN - 1 VIEW COMPARISON:  10/19/2015 FINDINGS: A dilated loop of mid abdomen small bowel is noted. There is minimal gas otherwise within the abdomen. An NG  tube is seen with its tip over the anticipated position of the antrum of the stomach. IMPRESSION: Findings consistent with small-bowel obstruction. NG tube as described. Electronically Signed   By: Esperanza Heir M.D.   On: 10/20/2015 12:30   Dg Chest Port 1 View  10/18/2015  CLINICAL DATA:  Pain and nausea EXAM: PORTABLE CHEST - 1 VIEW COMPARISON:  07/31/2015 FINDINGS: Cardiac shadow is within normal limits. The lungs are well aerated bilaterally. No focal infiltrate or sizable  effusion is seen. No bony abnormality is noted. IMPRESSION: No active disease. Electronically Signed   By: Alcide Clever M.D.   On: 10/18/2015 11:02   Dg Abd 2 Views  10/21/2015  CLINICAL DATA:  Follow-up small bowel obstruction, asymptomatic today EXAM: ABDOMEN - 2 VIEW COMPARISON:  Supine abdominal film dated October 20, 2015 FINDINGS: There are few loops of minimally distended gas-filled small bowel in the upper abdomen. The nasogastric tube lies in the distal aspect of the partially gas-filled stomach. There are stable calcifications projecting above the right iliac crest. There stable degenerative changes and dextrocurvature of the thoracolumbar spine. Stable severe degenerative changes of both hips are noted. The patient has undergone previous ORIF for subcapital fracture of the left hip. IMPRESSION: No significant obstructive pattern is observed today. There do remain a few loops of mildly distended gas and fluid-filled small bowel in the upper abdomen. Electronically Signed   By: David  [place] M.D.   On: 10/21/2015 08:08   Dg Abd Portable 1v  10/19/2015  CLINICAL DATA:  Small bowel obstruction.  Enteric tube. EXAM: PORTABLE ABDOMEN - 1 VIEW COMPARISON:  Abdominal radiographs and CT abdomen from 1 day prior. FINDINGS: Enteric tube terminates in the proximal stomach. There is a solitary mildly dilated small bowel loop in the left abdomen, noting a relative paucity of gas in the small bowel. Mild stool in the colon and rectum. No evidence of pneumatosis or pneumoperitoneum. Patchy opacity at the left lung base. Surgical pins overlie the left femoral neck. IMPRESSION: 1. Enteric tube terminates in the proximal stomach . 2. Persistent paucity of small bowel gas with mildly dilated proximal small bowel loop, not appreciably changed, in keeping with a persistent mid to distal small bowel obstruction with fluid-filled small bowel loops as seen on recent CT. Electronically Signed   By: Delbert Phenix M.D.   On:  10/19/2015 09:37   Dg Abd Portable 1v  10/18/2015  CLINICAL DATA:  NG tube placement. EXAM: PORTABLE ABDOMEN - 1 VIEW COMPARISON:  CT, 10/18/2015 at 1303 hours FINDINGS: Nasogastric tube passes below the diaphragm well into the stomach. There is little bowel air on the supine exam. No radiographic evidence of obstruction. Dilated small bowel loops seen on earlier CT scan may not be visible due to the supine technique lack of air. Soft tissues are unremarkable. IMPRESSION: Nasogastric tube well positioned with its tip in the mid stomach. Electronically Signed   By: Amie Portland M.D.   On: 10/18/2015 17:37    Microbiology: Recent Results (from the past 240 hour(s))  Culture, blood (Routine X 2) w Reflex to ID Panel     Status: None   Collection Time: 10/18/15 10:25 AM  Result Value Ref Range Status   Specimen Description BLOOD RIGHT ANTECUBITAL  Final   Special Requests BOTTLES DRAWN AEROBIC ONLY 5CC  Final   Culture NO GROWTH 5 DAYS  Final   Report Status 10/23/2015 FINAL  Final  Culture, blood (Routine X 2) w Reflex to ID  Panel     Status: None   Collection Time: 10/18/15 11:10 AM  Result Value Ref Range Status   Specimen Description BLOOD RIGHT HAND  Final   Special Requests BOTTLES DRAWN AEROBIC AND ANAEROBIC 5CC  Final   Culture NO GROWTH 5 DAYS  Final   Report Status 10/23/2015 FINAL  Final  MRSA PCR Screening     Status: None   Collection Time: 10/18/15  3:21 PM  Result Value Ref Range Status   MRSA by PCR NEGATIVE NEGATIVE Final    Comment:        The GeneXpert MRSA Assay (FDA approved for NASAL specimens only), is one component of a comprehensive MRSA colonization surveillance program. It is not intended to diagnose MRSA infection nor to guide or monitor treatment for MRSA infections.   Urine culture     Status: None   Collection Time: 10/20/15  6:11 PM  Result Value Ref Range Status   Specimen Description URINE, RANDOM  Final   Special Requests NONE  Final    Culture NO GROWTH 1 DAY  Final   Report Status 10/21/2015 FINAL  Final     Labs: Basic Metabolic Panel:  Recent Labs Lab 10/20/15 0725 10/20/15 0945 10/21/15 1030 10/23/15 0546  NA 146*  --  149* 142  K 3.3*  --  4.1 4.3  CL 112*  --  117* 114*  CO2 20*  --  26 23  GLUCOSE 55*  --  107* 85  BUN 57*  --  31* 11  CREATININE 1.74*  --  1.08 0.96  CALCIUM 7.9*  --  7.7* 7.6*  MG  --  2.7* 2.6*  --    Liver Function Tests: No results for input(s): AST, ALT, ALKPHOS, BILITOT, PROT, ALBUMIN in the last 168 hours. No results for input(s): LIPASE, AMYLASE in the last 168 hours. No results for input(s): AMMONIA in the last 168 hours. CBC:  Recent Labs Lab 10/20/15 0945 10/21/15 1030 10/23/15 0546  WBC 13.5* 10.7* 9.5  NEUTROABS 10.2*  --   --   HGB 11.4* 10.6* 10.3*  HCT 33.9* 31.7* 31.8*  MCV 100.0 100.6* 100.0  PLT 180 201 240   Cardiac Enzymes: No results for input(s): CKTOTAL, CKMB, CKMBINDEX, TROPONINI in the last 168 hours. BNP: BNP (last 3 results)  Recent Labs  07/30/15 0530  BNP 299.2*    ProBNP (last 3 results) No results for input(s): PROBNP in the last 8760 hours.  CBG: No results for input(s): GLUCAP in the last 168 hours.     SignedKathlen Mody:  Colum Colt MD   [facility] 10/26/2015, 10:13 AM

## 2016-12-03 IMAGING — DX DG CHEST 1V PORT
1 series · 1 of 1 positions shown · non-contrast
Comparison: July 15, 2015

CLINICAL DATA: Abnormally low blood pressure

EXAM:
PORTABLE CHEST 1 VIEW

[chest ap]
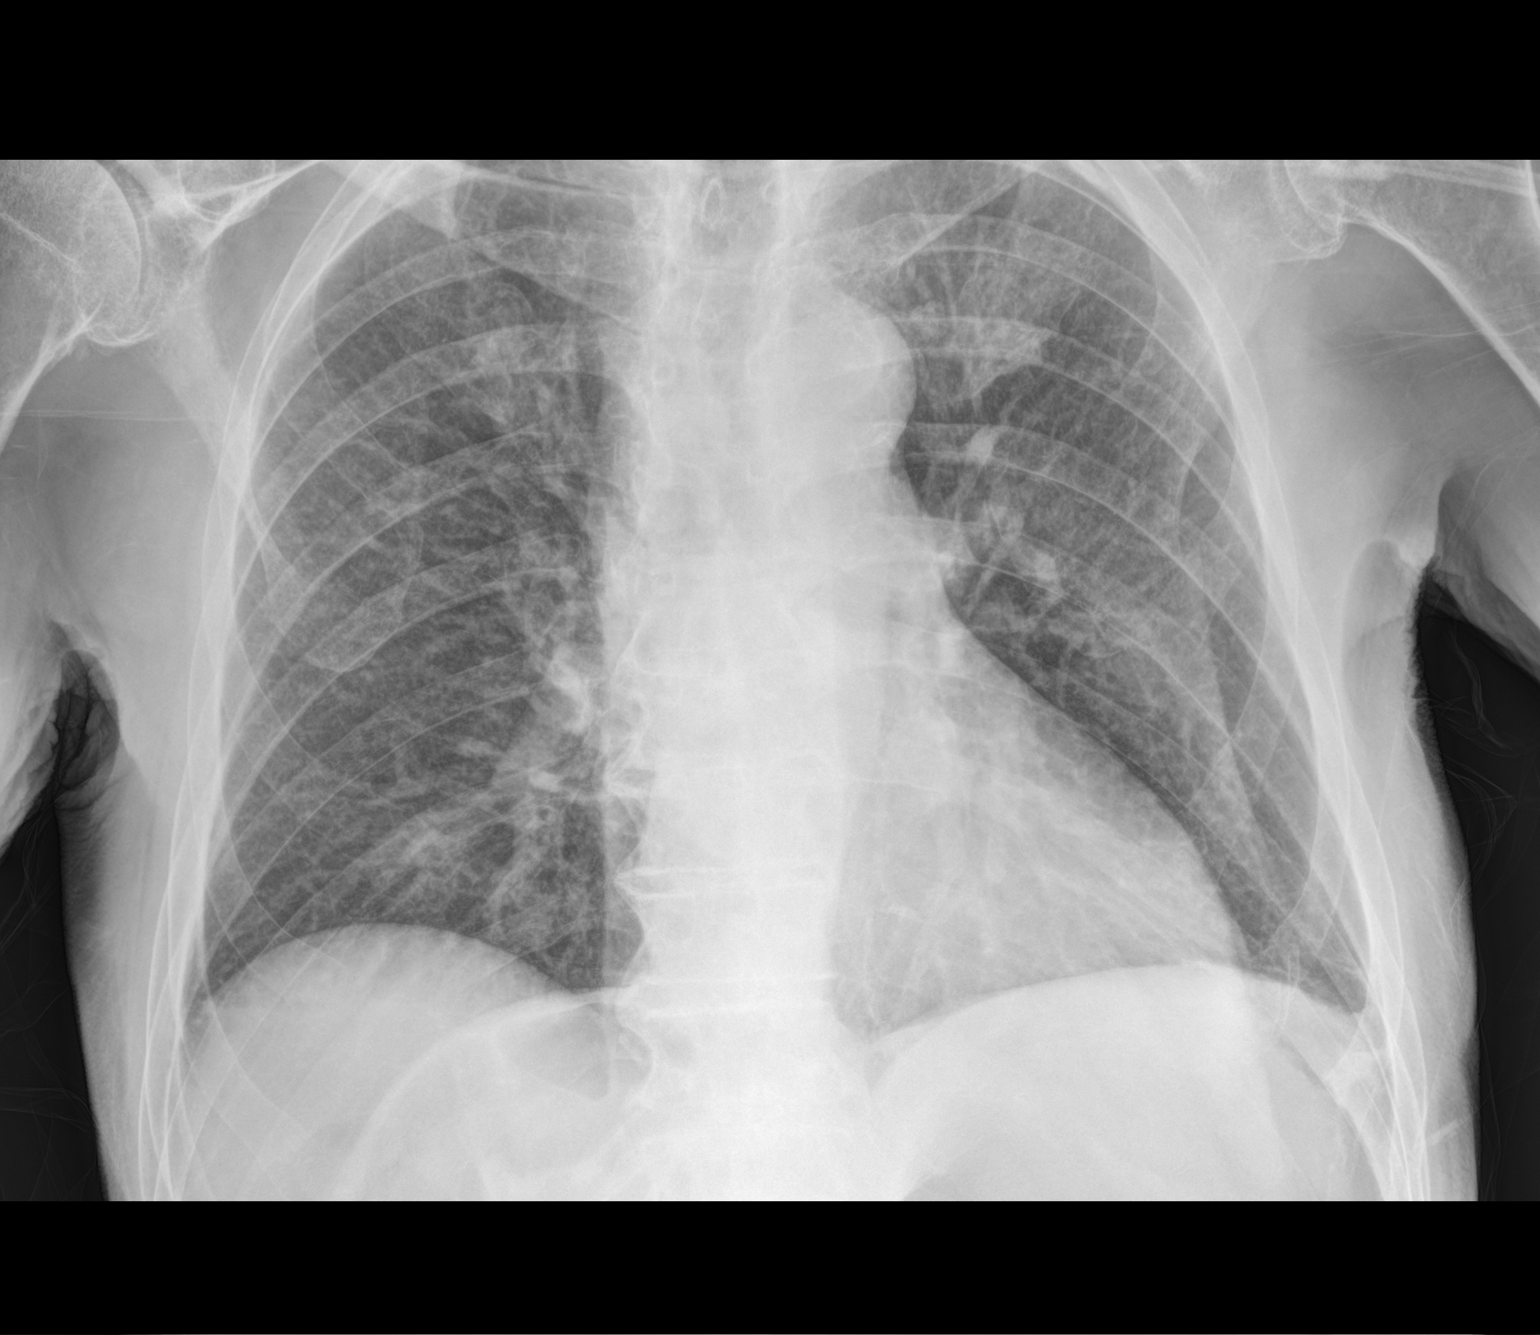

[1 of 1 positions shown; findings below may reference images not displayed]

FINDINGS: The heart size and mediastinal contours are stable. There is
bilateral increased pulmonary interstitium. There is no focal
pneumonia or pleural effusion. The visualized skeletal structures
are stable.
IMPRESSION: Diffuse increased pulmonary interstitium bilaterally. This is
nonspecific but can be seen in interstitial edema or viral
pneumonitis.

## 2016-12-03 IMAGING — DX DG SPINE 1V PORT
1 series · 1 of 1 positions shown · non-contrast
Comparison: Intraoperative localization films earlier today.

CLINICAL DATA: Patient for L3-5 lumbar surgery. Intraoperative
localization film.

EXAM:
PORTABLE SPINE - 1 VIEW

[l-spine x-table]
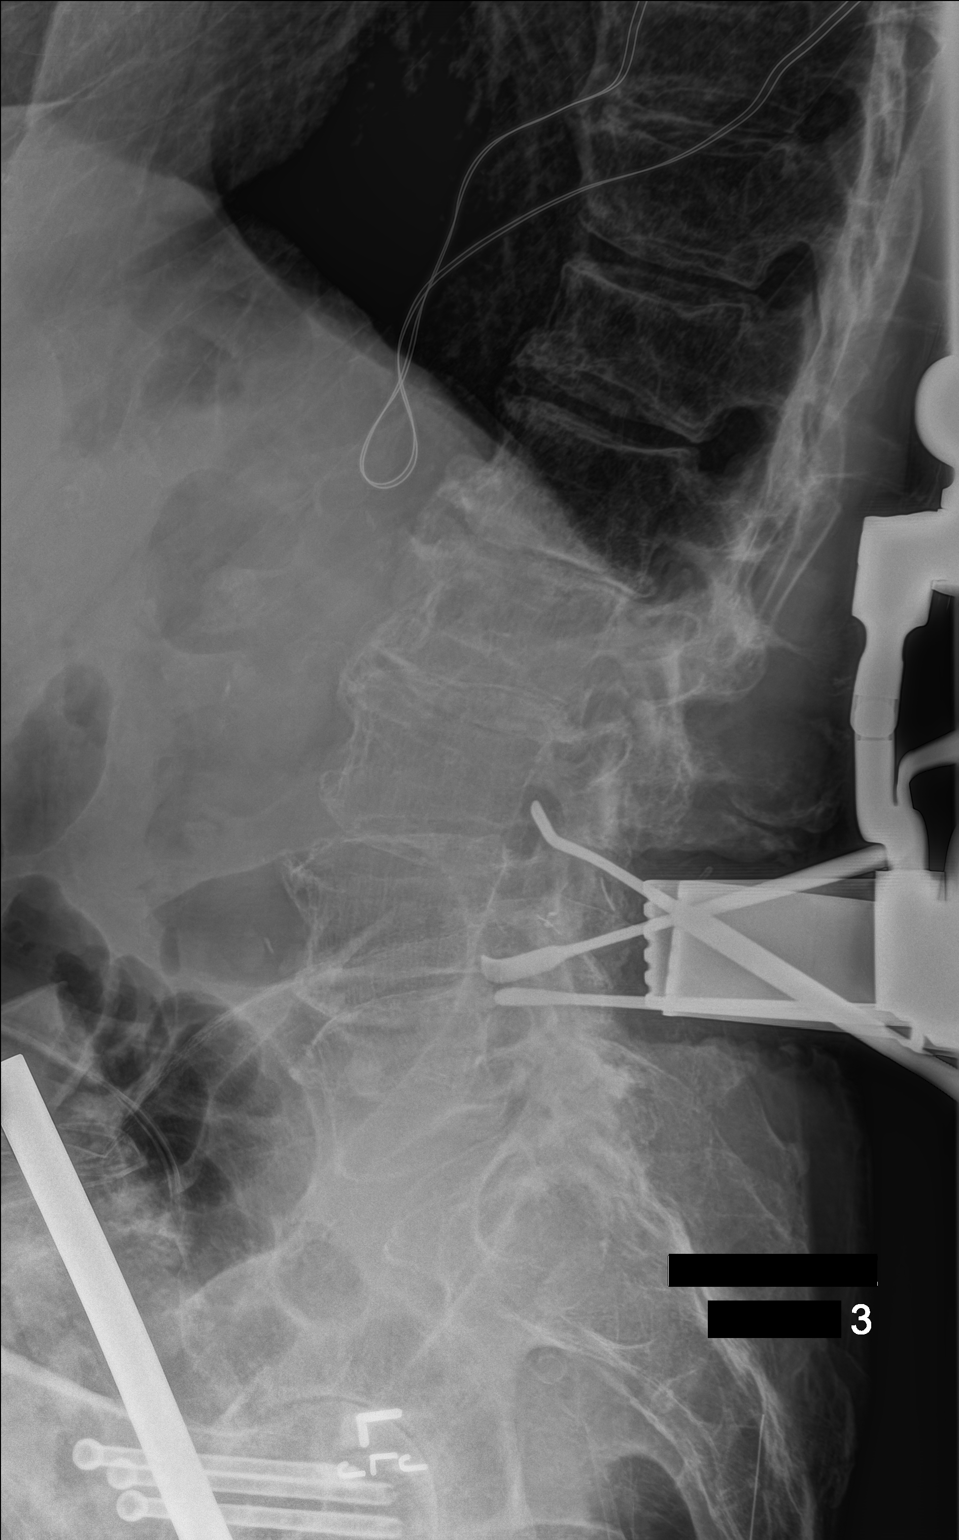

[1 of 1 positions shown; findings below may reference images not displayed]

FINDINGS: Clamp remains on the L4 spinous process. 3 probes are in place. The
most superior projects over the L3-4 foramina. Second more inferior
probe projects over the L4-5 foramina. A third probe is at the level
of the superior endplate of L5.
IMPRESSION: Localization as above.

## 2016-12-03 IMAGING — DX DG SPINE 1V PORT
1 series · 1 of 1 positions shown · non-contrast
Comparison: 07/23/2015

CLINICAL DATA: Surgical level L3-4, L4-5.

EXAM:
PORTABLE SPINE - 1 VIEW

[l-spine x-table]
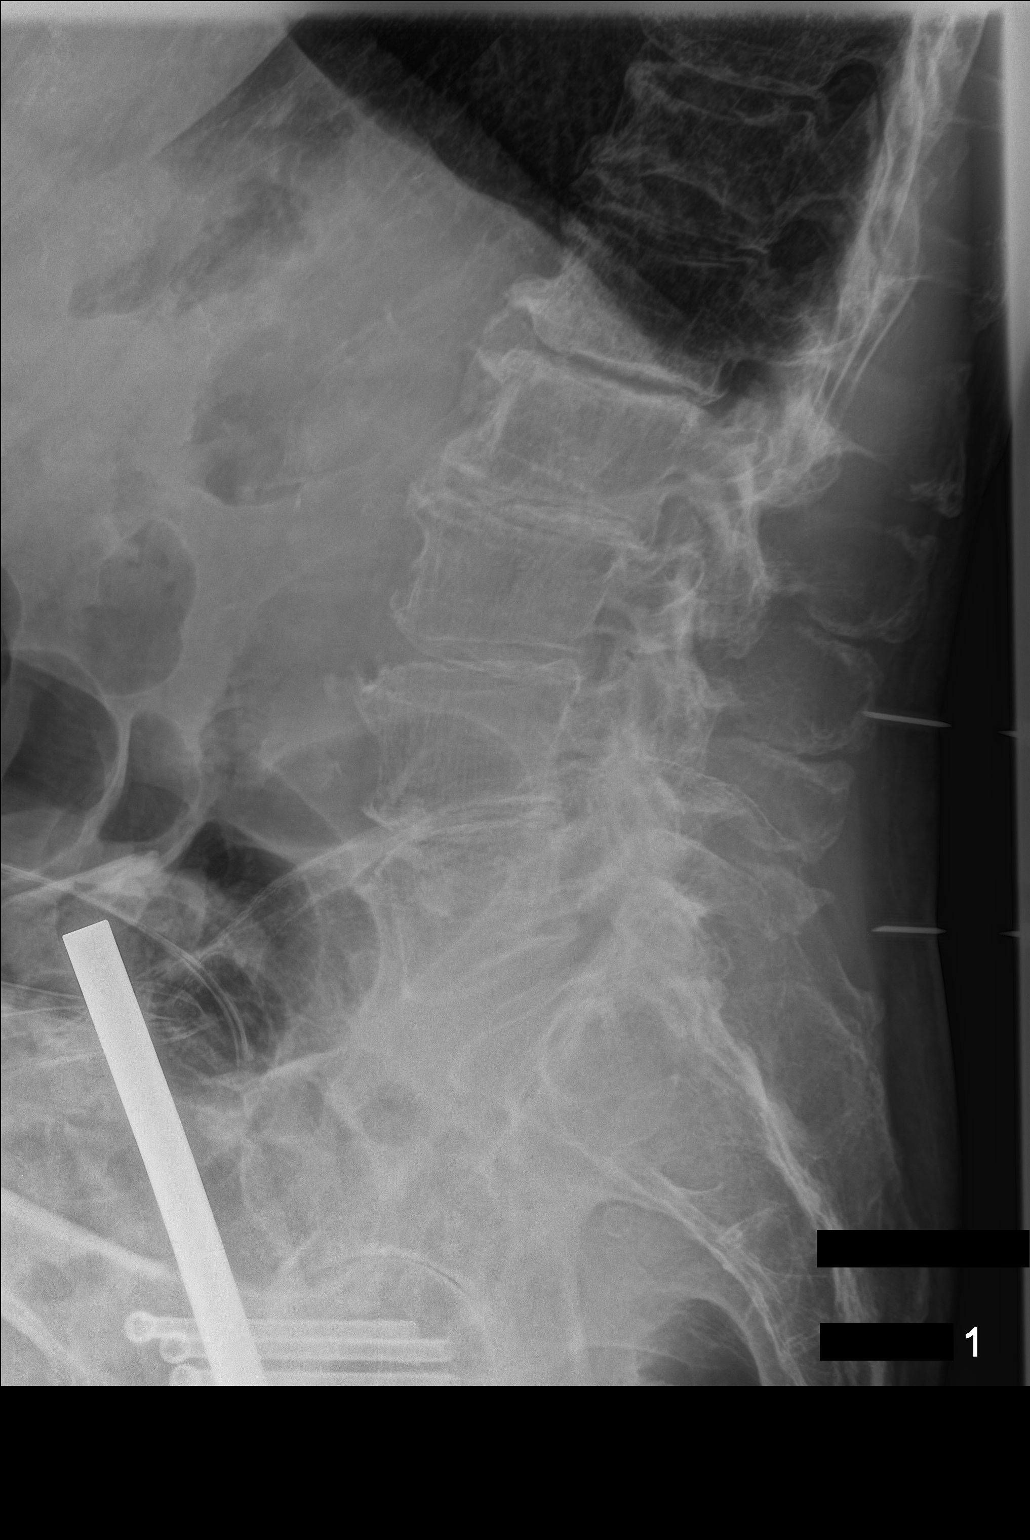

[1 of 1 positions shown; findings below may reference images not displayed]

FINDINGS: Posterior surgical instruments are directed at the L3 and L5 spinous
processes
IMPRESSION: Intraoperative localization as above.

## 2016-12-03 IMAGING — DX DG SPINE 1V PORT
1 series · 1 of 1 positions shown · non-contrast
Comparison: Intraoperative localization film earlier today.

CLINICAL DATA: Patient for lumbar surgery L3-5. Intraoperative
localization film.

EXAM:
PORTABLE SPINE - 1 VIEW

[l-spine x-table]
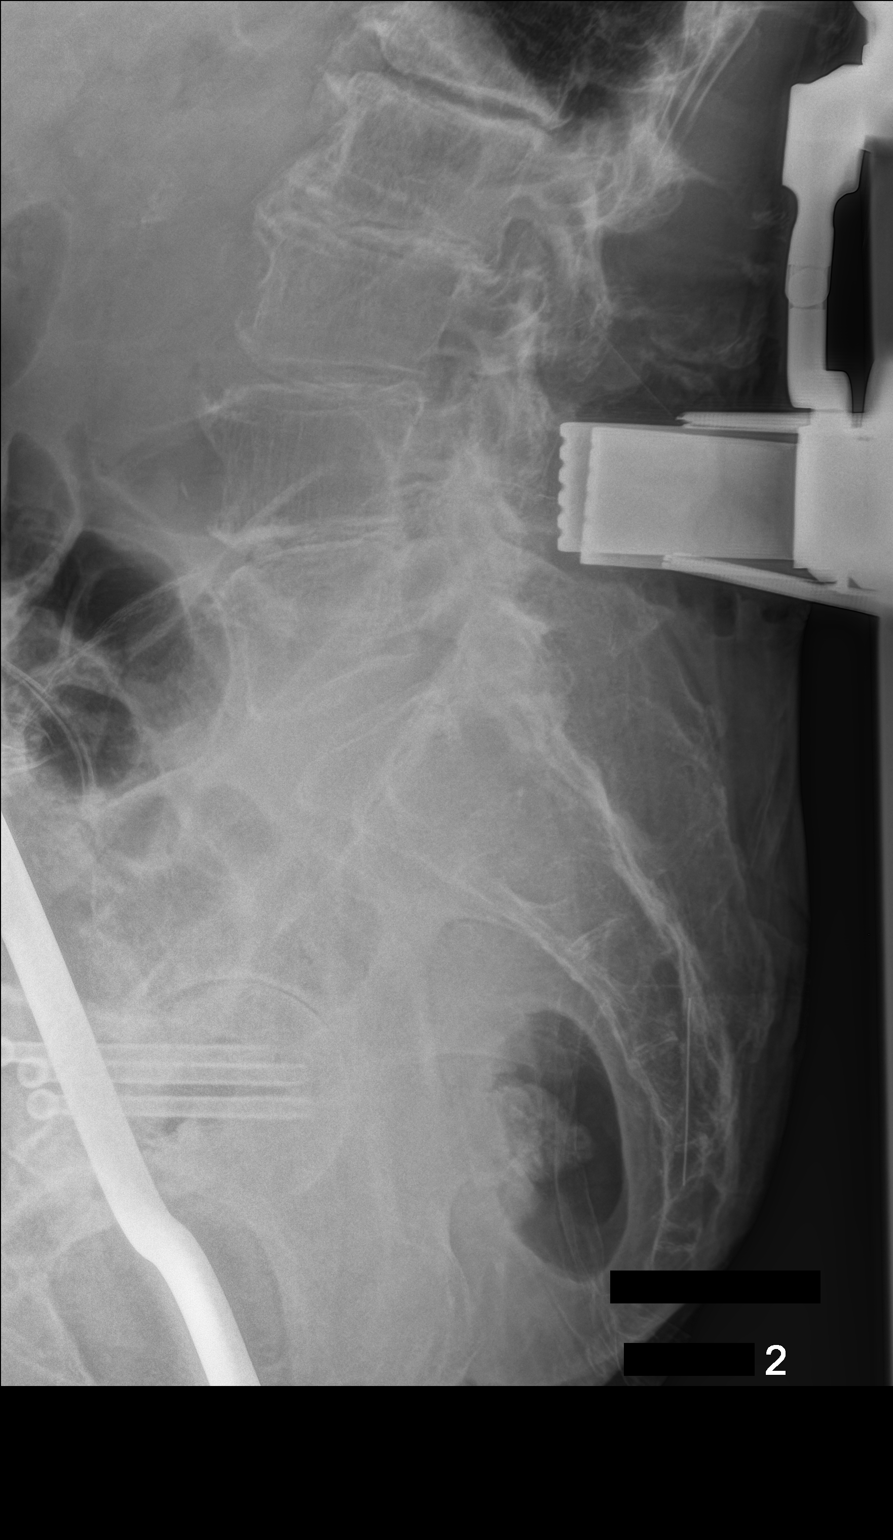

[1 of 1 positions shown; findings below may reference images not displayed]

FINDINGS: Clamp is now in place along the L4 spinous process. Probe superior
to the clamp is directed toward the L3-4 interspace and probe
inferior to the spinous process is directed toward the L4-5
interspace.
IMPRESSION: Localization as above.  Clamp is on the L4 spinous process.

## 2017-02-22 IMAGING — CT CT ABD-PELV W/O CM
2 of 4 series · 11 of 46 positions shown, 12 images · non-contrast
Comparison: None.

CLINICAL DATA: Lower abdominal pain, elevated lactic acidosis,
sepsis

EXAM:
CT ABDOMEN AND PELVIS WITHOUT CONTRAST
TECHNIQUE: Multidetector CT imaging of the abdomen and pelvis was performed
following the standard protocol without IV contrast. Sagittal and
coronal MPR images reconstructed from axial data set. Oral contrast
not administered.

[Series 201: routine, idose (2) · axial · 0.78mm/px · z∈[-60,+305]mm · 8 of 89 slices shown, 9 images]
[im 8/89  soft-tissue]
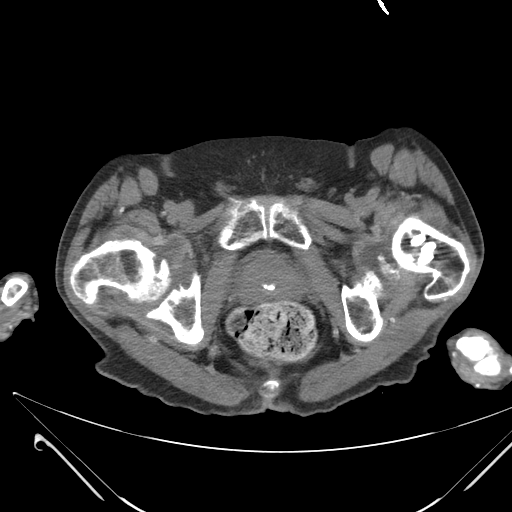
[im 8/89  bone]
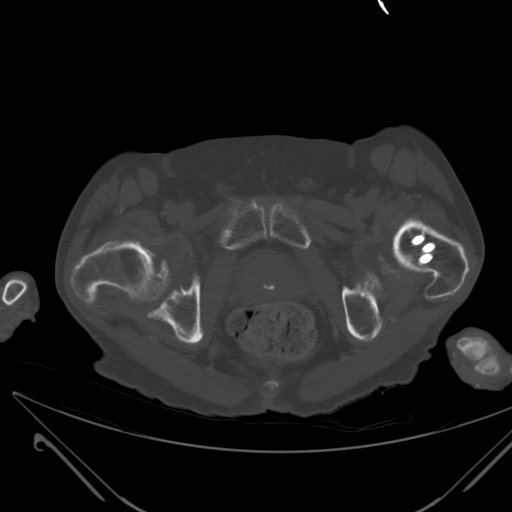
[im 20/89  soft-tissue]
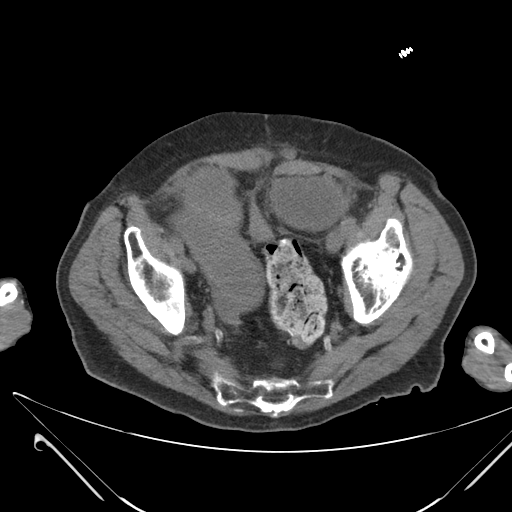
[im 27/89  soft-tissue]
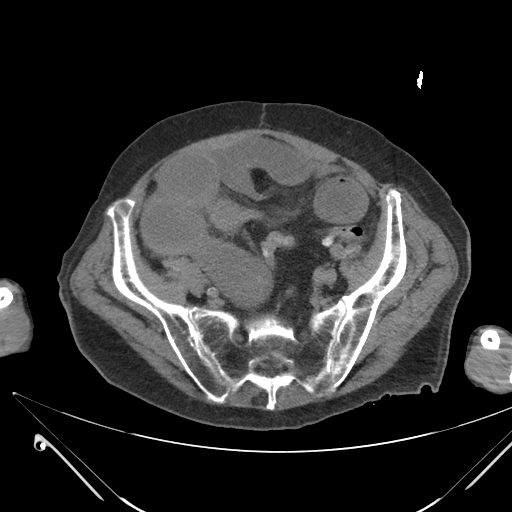
[im 39/89  soft-tissue]
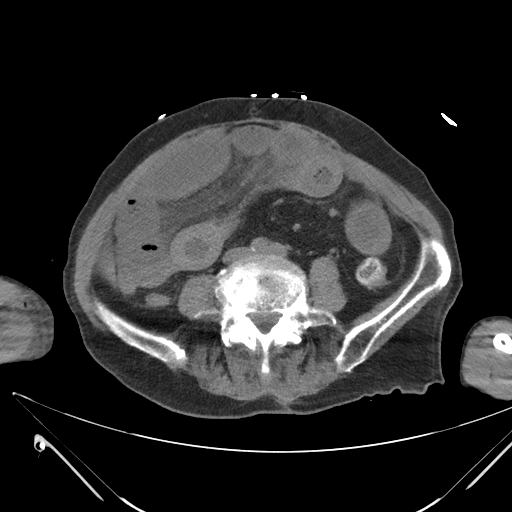
[im 50/89  soft-tissue]
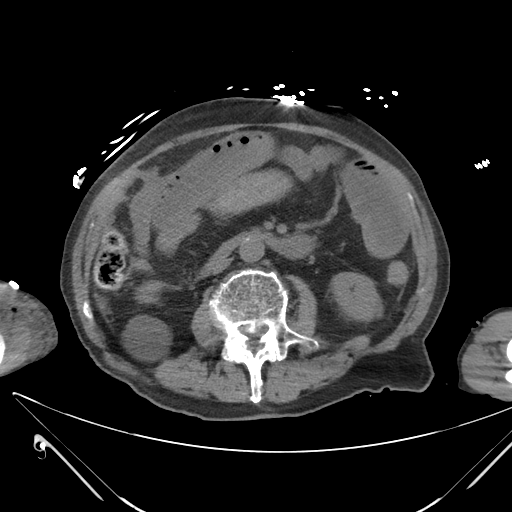
[im 62/89  soft-tissue]
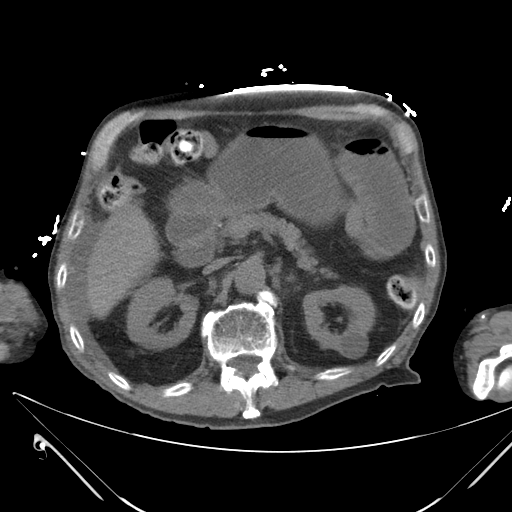
[im 69/89  soft-tissue]
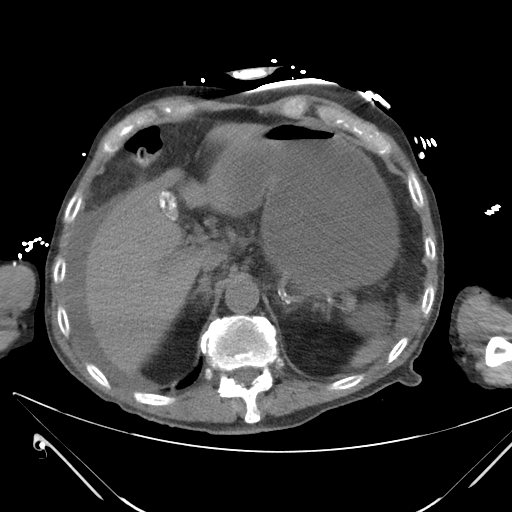
[im 81/89  soft-tissue]
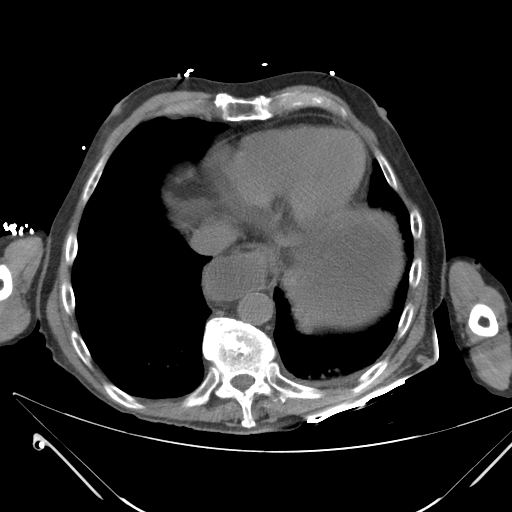

[Series 203: coronals, idose (2) · coronal · 0.45mm/px · 3 of 158 slices shown]
[im 53/158  soft-tissue]
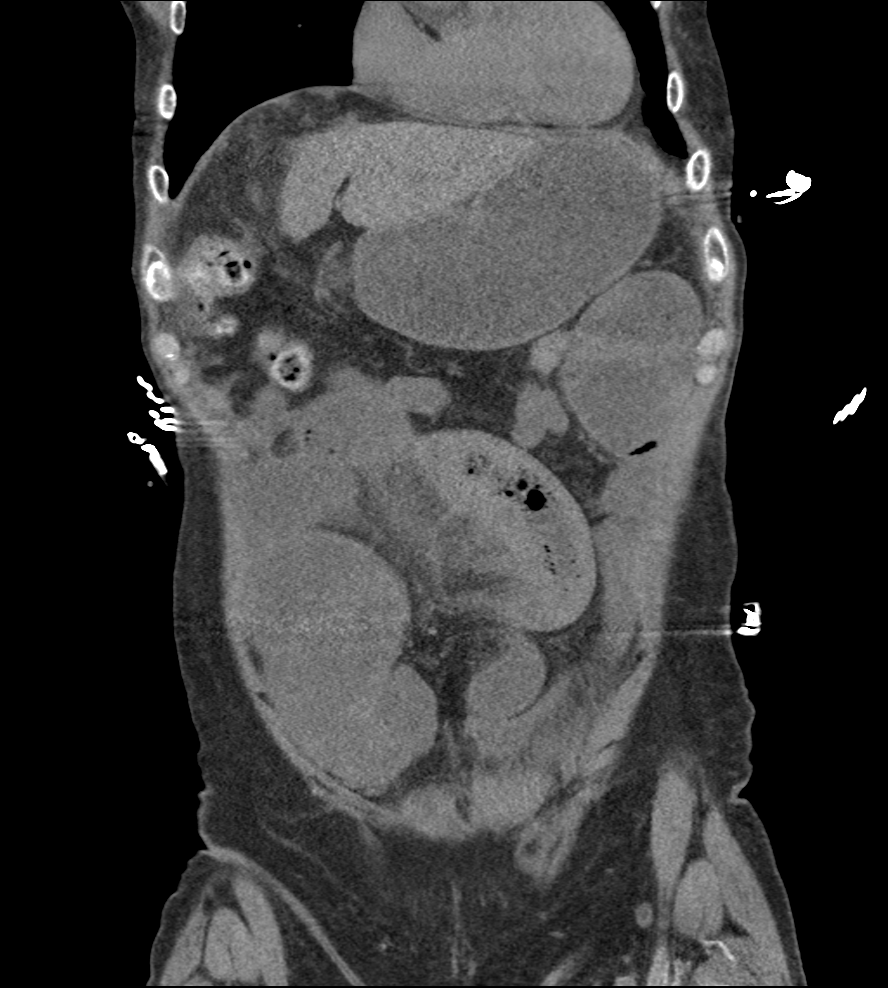
[im 70/158  soft-tissue]
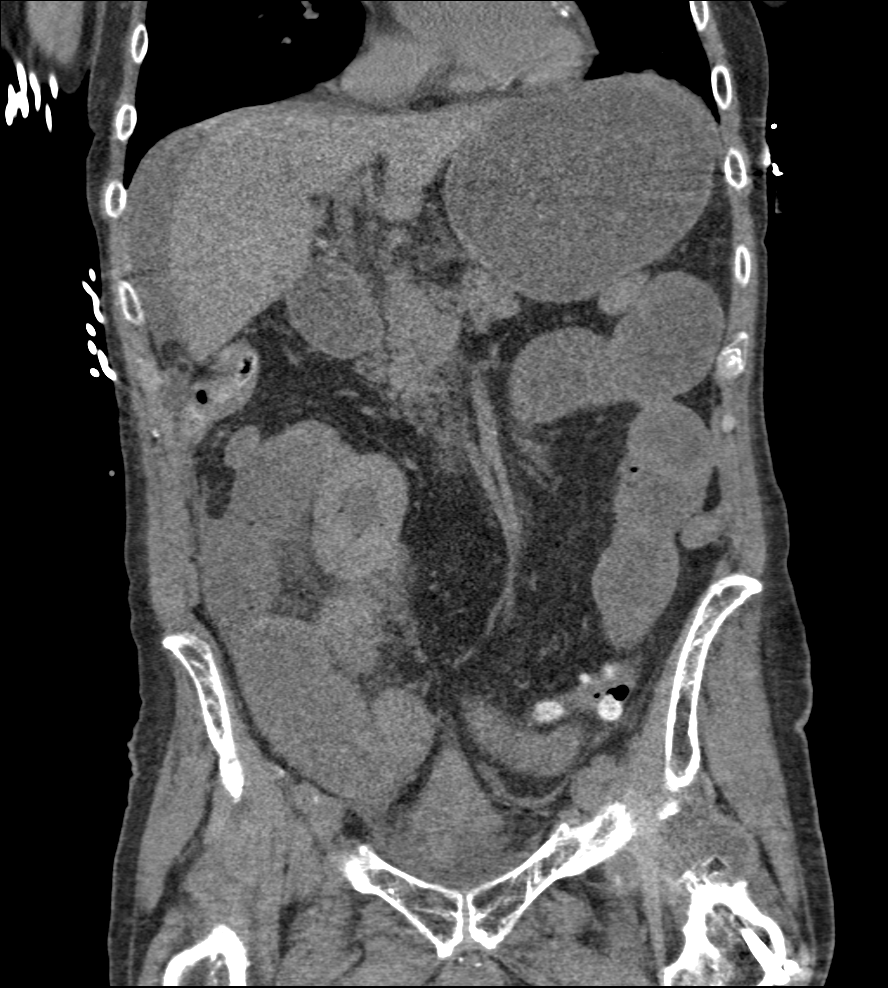
[im 88/158  soft-tissue]
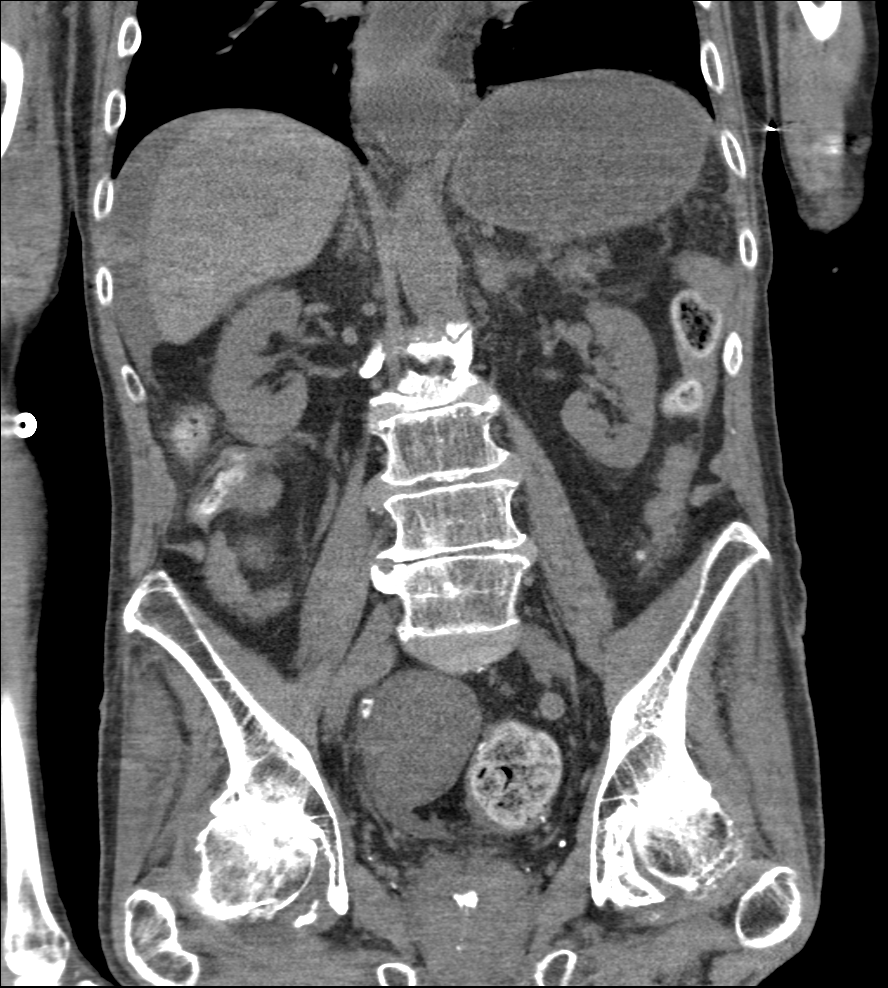

[11 of 46 positions shown; findings below may reference images not displayed]

FINDINGS: Dependent atelectasis LEFT lower lobe.

Scattered atherosclerotic calcification aorta and coronary arteries.

Beam hardening artifacts traverse upper abdomen secondary to
patient's arms within imaged field.

BILATERAL renal cysts.

Numerous gallstones within a potential porcelain gallbladder.

Slightly irregular/ nodular hepatic margins raising question of
cirrhosis.

No focal mass lesions of the liver, spleen, pancreas, or adrenal
glands.

No hydronephrosis or ureteral dilatation.

Stomach distended small hiatal hernia.

Significant dilatation of proximal small bowel loops with
decompressed distal small bowel loops compatible with obstruction.

Several of the dilated small bowel loops proximally show wall
thickening, with significant thickening of a single small bowel loop
in the mid abdomen, raising question of small bowel ischemia.

On coronal images, a suggestion of swirling of the mesentery is seen
though this is not definitely confirmed on the axial images.

Significant mesenteric edema.

Distal small bowel and colon decompressed without wall thickening.

Scattered ascites.

Decompressed urinary bladder with minimal prostatic enlargement.

No mass, adenopathy, or free air.

Bones demineralized with pins at proximal RIGHT femur.

Anterior compression deformity of T12 with Schmorl's node formation,
appears old.

Degenerative disc disease changes thoracolumbar spine.
IMPRESSION: Dilated stomach and proximal small bowel loops with decompressed
distal small bowel and colon compatible with small bowel
obstruction.

Significant bowel wall thickening of small bowel loops in the mid
abdomen with mesenteric edema suspicious for ischemic small bowel.

Suggestion of swirling of the mesentery on coronal images seen is
seen which can be seen with internal hernia or small bowel volvulus
though this is not seen on axial images.

Findings called to Dr. Engler on 10/18/2015 at 2708 hours.

## 2017-05-12 ENCOUNTER — Inpatient Hospital Stay (HOSPITAL_COMMUNITY)
Admission: EM | Admit: 2017-05-12 | Discharge: 2017-05-14 | DRG: 299 | Disposition: A | Discharge status: Home / Self Care | Payer: Medicare Other | Attending: Internal Medicine | Admitting: Internal Medicine

## 2017-05-12 ENCOUNTER — Encounter (HOSPITAL_COMMUNITY): Payer: Self-pay | Admitting: Emergency Medicine

## 2017-05-12 ENCOUNTER — Emergency Department (HOSPITAL_COMMUNITY): Payer: Medicare Other

## 2017-05-12 ENCOUNTER — Emergency Department (HOSPITAL_BASED_OUTPATIENT_CLINIC_OR_DEPARTMENT_OTHER): Admit: 2017-05-12 | Discharge: 2017-05-12 | Disposition: A | Discharge status: Home / Self Care | Payer: Medicare Other

## 2017-05-12 DIAGNOSIS — Z885 Allergy status to narcotic agent status: Secondary | ICD-10-CM | POA: Diagnosis not present

## 2017-05-12 DIAGNOSIS — E43 Unspecified severe protein-calorie malnutrition: Secondary | ICD-10-CM | POA: Diagnosis present

## 2017-05-12 DIAGNOSIS — I82409 Acute embolism and thrombosis of unspecified deep veins of unspecified lower extremity: Secondary | ICD-10-CM | POA: Diagnosis present

## 2017-05-12 DIAGNOSIS — K5909 Other constipation: Secondary | ICD-10-CM

## 2017-05-12 DIAGNOSIS — I82422 Acute embolism and thrombosis of left iliac vein: Secondary | ICD-10-CM | POA: Diagnosis not present

## 2017-05-12 DIAGNOSIS — N400 Enlarged prostate without lower urinary tract symptoms: Secondary | ICD-10-CM | POA: Diagnosis present

## 2017-05-12 DIAGNOSIS — I480 Paroxysmal atrial fibrillation: Secondary | ICD-10-CM | POA: Diagnosis present

## 2017-05-12 DIAGNOSIS — Z823 Family history of stroke: Secondary | ICD-10-CM

## 2017-05-12 DIAGNOSIS — Z79899 Other long term (current) drug therapy: Secondary | ICD-10-CM | POA: Diagnosis not present

## 2017-05-12 DIAGNOSIS — I82492 Acute embolism and thrombosis of other specified deep vein of left lower extremity: Secondary | ICD-10-CM | POA: Diagnosis not present

## 2017-05-12 DIAGNOSIS — I82432 Acute embolism and thrombosis of left popliteal vein: Secondary | ICD-10-CM | POA: Diagnosis present

## 2017-05-12 DIAGNOSIS — M79604 Pain in right leg: Secondary | ICD-10-CM | POA: Diagnosis present

## 2017-05-12 DIAGNOSIS — R609 Edema, unspecified: Secondary | ICD-10-CM

## 2017-05-12 DIAGNOSIS — I824Y2 Acute embolism and thrombosis of unspecified deep veins of left proximal lower extremity: Secondary | ICD-10-CM | POA: Diagnosis not present

## 2017-05-12 DIAGNOSIS — Z7901 Long term (current) use of anticoagulants: Secondary | ICD-10-CM | POA: Diagnosis not present

## 2017-05-12 DIAGNOSIS — I871 Compression of vein: Secondary | ICD-10-CM | POA: Diagnosis present

## 2017-05-12 DIAGNOSIS — Z6820 Body mass index (BMI) 20.0-20.9, adult: Secondary | ICD-10-CM | POA: Diagnosis not present

## 2017-05-12 DIAGNOSIS — I82412 Acute embolism and thrombosis of left femoral vein: Secondary | ICD-10-CM | POA: Diagnosis present

## 2017-05-12 DIAGNOSIS — G8929 Other chronic pain: Secondary | ICD-10-CM | POA: Diagnosis present

## 2017-05-12 DIAGNOSIS — M25551 Pain in right hip: Secondary | ICD-10-CM | POA: Diagnosis present

## 2017-05-12 DIAGNOSIS — K5900 Constipation, unspecified: Secondary | ICD-10-CM | POA: Diagnosis present

## 2017-05-12 DIAGNOSIS — M48061 Spinal stenosis, lumbar region without neurogenic claudication: Secondary | ICD-10-CM | POA: Diagnosis present

## 2017-05-12 HISTORY — DX: Bladder-neck obstruction: N32.0

## 2017-05-12 HISTORY — DX: Ileus, unspecified: K56.7

## 2017-05-12 HISTORY — DX: Benign prostatic hyperplasia without lower urinary tract symptoms: N40.0

## 2017-05-12 HISTORY — DX: Acute embolism and thrombosis of unspecified deep veins of unspecified lower extremity: I82.409

## 2017-05-12 HISTORY — DX: Dermatitis, unspecified: L30.9

## 2017-05-12 HISTORY — DX: Diverticulitis of intestine, part unspecified, without perforation or abscess without bleeding: K57.92

## 2017-05-12 HISTORY — DX: Paroxysmal atrial fibrillation: I48.0

## 2017-05-12 HISTORY — DX: Unspecified intestinal obstruction, unspecified as to partial versus complete obstruction: K56.609

## 2017-05-12 HISTORY — DX: Spinal stenosis, site unspecified: M48.00

## 2017-05-12 LAB — BASIC METABOLIC PANEL
Anion gap: 11 (ref 5–15)
BUN: 10 mg/dL (ref 6–20)
CALCIUM: 9.1 mg/dL (ref 8.9–10.3)
CO2: 23 mmol/L (ref 22–32)
CREATININE: 1.09 mg/dL (ref 0.61–1.24)
Chloride: 106 mmol/L (ref 101–111)
GFR calc Af Amer: >60 mL/min (ref >60)
GFR calc non Af Amer: >60 mL/min (ref >60)
GLUCOSE: 85 mg/dL (ref 65–99)
Potassium: 4 mmol/L (ref 3.5–5.1)
Sodium: 140 mmol/L (ref 135–145)

## 2017-05-12 LAB — CBC WITH DIFFERENTIAL/PLATELET
BASOS PCT: 1 %
Basophils Absolute: 0 10*3/uL (ref 0.0–0.1)
EOS ABS: 0.1 10*3/uL (ref 0.0–0.7)
Eosinophils Relative: 1 %
HEMATOCRIT: 38.9 % — AB (ref 39.0–52.0)
Hemoglobin: 13 g/dL (ref 13.0–17.0)
Lymphocytes Relative: 25 %
Lymphs Abs: 1.6 10*3/uL (ref 0.7–4.0)
MCH: 32.6 pg (ref 26.0–34.0)
MCHC: 33.4 g/dL (ref 30.0–36.0)
MCV: 97.5 fL (ref 78.0–100.0)
MONO ABS: 0.5 10*3/uL (ref 0.1–1.0)
MONOS PCT: 8 %
NEUTROS ABS: 4.1 10*3/uL (ref 1.7–7.7)
Neutrophils Relative %: 65 %
Platelets: 155 10*3/uL (ref 150–400)
RBC: 3.99 MIL/uL — ABNORMAL LOW (ref 4.22–5.81)
RDW: 12.9 % (ref 11.5–15.5)
WBC: 6.3 10*3/uL (ref 4.0–10.5)

## 2017-05-12 MED ORDER — IOPAMIDOL (ISOVUE-370) INJECTION 76%
INTRAVENOUS | Status: AC
  Administered 2017-05-12: 100 mL via INTRAVENOUS
  Filled 2017-05-12: qty 100

## 2017-05-12 MED ORDER — ONDANSETRON HCL 4 MG/2ML IJ SOLN: 4.0000 mg | Freq: Four times a day (QID) | INTRAMUSCULAR | Status: DC | PRN

## 2017-05-12 MED ORDER — B COMPLEX PO TABS: 1.0000 | ORAL_TABLET | Freq: Every day | ORAL | Status: DC

## 2017-05-12 MED ORDER — B COMPLEX-C PO TABS
1.0000 | ORAL_TABLET | Freq: Every day | ORAL | Status: DC
  Administered 2017-05-13 – 2017-05-14 (×2): 1 via ORAL
  Filled 2017-05-12 (×2): qty 1

## 2017-05-12 MED ORDER — SODIUM CHLORIDE 0.9 % IV SOLN
INTRAVENOUS | Status: DC
  Administered 2017-05-12 – 2017-05-13 (×2): via INTRAVENOUS

## 2017-05-12 MED ORDER — HEPARIN (PORCINE) IN NACL 100-0.45 UNIT/ML-% IJ SOLN
1000.0000 [IU]/h | INTRAMUSCULAR | Status: DC
  Administered 2017-05-12: 1000 [IU]/h via INTRAVENOUS
  Filled 2017-05-12: qty 250

## 2017-05-12 MED ORDER — ACETAMINOPHEN 650 MG RE SUPP: 650.0000 mg | Freq: Four times a day (QID) | RECTAL | Status: DC | PRN

## 2017-05-12 MED ORDER — HEPARIN BOLUS VIA INFUSION
3000.0000 [IU] | Freq: Once | INTRAVENOUS | Status: AC
  Administered 2017-05-12: 3000 [IU] via INTRAVENOUS
  Filled 2017-05-12: qty 3000

## 2017-05-12 MED ORDER — POLYETHYLENE GLYCOL 3350 17 G PO PACK: 17.0000 g | PACK | Freq: Every day | ORAL | Status: DC | PRN

## 2017-05-12 MED ORDER — ONDANSETRON HCL 4 MG PO TABS: 4.0000 mg | ORAL_TABLET | Freq: Four times a day (QID) | ORAL | Status: DC | PRN

## 2017-05-12 MED ORDER — BISACODYL 5 MG PO TBEC: 5.0000 mg | DELAYED_RELEASE_TABLET | Freq: Every day | ORAL | Status: DC | PRN

## 2017-05-12 MED ORDER — FOLIC ACID 1 MG PO TABS
1.0000 mg | ORAL_TABLET | Freq: Every day | ORAL | Status: DC
  Administered 2017-05-13 – 2017-05-14 (×2): 1 mg via ORAL
  Filled 2017-05-12 (×2): qty 1

## 2017-05-12 MED ORDER — ACETAMINOPHEN 325 MG PO TABS
650.0000 mg | ORAL_TABLET | Freq: Four times a day (QID) | ORAL | Status: DC | PRN
  Filled 2017-05-12: qty 2

## 2017-05-12 MED ORDER — RIVAROXABAN (XARELTO) VTE STARTER PACK (15 & 20 MG): 15.0000 mg | ORAL_TABLET | Freq: Two times a day (BID) | ORAL | 0 refills | Status: DC

## 2017-05-12 MED ORDER — ENSURE ENLIVE PO LIQD
237.0000 mL | Freq: Two times a day (BID) | ORAL | Status: DC
  Administered 2017-05-13 – 2017-05-14 (×3): 237 mL via ORAL

## 2017-05-12 MED ORDER — RIVAROXABAN (XARELTO) EDUCATION KIT FOR DVT/PE PATIENTS
PACK | Freq: Once | Status: DC
  Filled 2017-05-12: qty 1

## 2017-05-12 MED ORDER — ADULT MULTIVITAMIN W/MINERALS CH
1.0000 | ORAL_TABLET | Freq: Every day | ORAL | Status: DC
  Administered 2017-05-13 – 2017-05-14 (×2): 1 via ORAL
  Filled 2017-05-12 (×2): qty 1

## 2017-05-12 MED ORDER — LORAZEPAM 0.5 MG PO TABS
0.5000 mg | ORAL_TABLET | Freq: Once | ORAL | Status: AC
  Administered 2017-05-12: 0.5 mg via ORAL
  Filled 2017-05-12: qty 1

## 2017-05-12 MED ORDER — VITAMIN C 500 MG PO TABS
500.0000 mg | ORAL_TABLET | Freq: Every day | ORAL | Status: DC
  Administered 2017-05-13 – 2017-05-14 (×2): 500 mg via ORAL
  Filled 2017-05-12 (×2): qty 1

## 2017-05-12 MED ORDER — VITAMIN B-12 100 MCG PO TABS
100.0000 ug | ORAL_TABLET | Freq: Every day | ORAL | Status: DC
  Administered 2017-05-13 – 2017-05-14 (×2): 100 ug via ORAL
  Filled 2017-05-12 (×2): qty 1

## 2017-05-12 MED ORDER — SORBITOL 70 % SOLN
30.0000 mL | Freq: Every day | Status: DC | PRN
  Filled 2017-05-12: qty 30

## 2017-05-12 MED ORDER — CALCIUM CARBONATE-VITAMIN D 500-200 MG-UNIT PO TABS
1.0000 | ORAL_TABLET | Freq: Every day | ORAL | Status: DC
  Administered 2017-05-13 – 2017-05-14 (×2): 1 via ORAL
  Filled 2017-05-12 (×2): qty 1

## 2017-05-12 NOTE — Progress Notes (Signed)
*  PRELIMINARY RESULTS* Vascular Ultrasound Left lower extremity venous duplex has been completed.  Preliminary findings: The left lower extremity is positive for deep vein thrombosis in the left iliac, common femoral, profunda, femoral, popliteal and calf veins.  Visualized veins of the right lower extremity appear negative for thrombosis.  Attempt was made to image IVC  But it is completely obscured by bowel and gas.  Preliminary results called to Tammy SoursGreg, patients ED nurse @ 12:20.  Chauncey FischerCharlotte C Khristin Keleher 05/12/2017, 12:19 PM

## 2017-05-12 NOTE — ED Notes (Signed)
US called notified positive for DVT left lower extremity. Doctor notified.

## 2017-05-12 NOTE — Progress Notes (Signed)
Clarice PoleJulius G Adkison is a 81 y.o. male patient admitted from ED awake, alert - oriented  X 4 - no acute distress noted.  VSS - Blood pressure 123/81, pulse (!) 102, temperature 98.4 F (36.9 C), temperature source Oral, resp. rate 18, height 5\' 8"  (1.727 m), weight 62.6 kg (138 lb), SpO2 99 %.    IV in place, occlusive dsg intact without redness.  Orientation to room, and floor completed with information packet given to patient/family.  Patient declined safety video at this time.  Admission INP armband ID verified with patient/family, and in place.   with patient and family able to verbalize understanding of risk associated with falls, and verbalized understanding to call nsg before up out of bed.  Call light within reach, patient able to voice, and demonstrate understanding.    Will cont to eval and treat per MD orders.  Casper HarrisonSamantha K Knox Holdman, RN 05/12/2017 7:36 PM

## 2017-05-12 NOTE — ED Notes (Signed)
Returned from CT.

## 2017-05-12 NOTE — H&P (Signed)
History and Physical:    Derek PoleJulius G Merle   YNW:[id number] DOB: 09/20/1936 DOA: 05/12/2017  Referring MD/provider: Dr. Arby BarretteMarcy Pfeiffer PCP: Thayer HeadingsMackenzie, Brian, MD   Patient coming from:  Home  Chief Complaint: Leg swelling on the left  History of Present Illness:   Derek Bentley is an 81 y.o. male with a PMH of spinal stenosis of L3-L4 and L4-L5 who underwent a decompression and microdiscectomy 07/2015 but who has had limited mobility since that time who presents with a 7-10 day history of worsening left lower extremity swelling. The patient denies significant pain on the left, although he does have chronic pain in his right leg and hip. He denies any recent travel. He is able to ambulate household distances with a 4 wheeled walker. He does not have any history of blood clotting problems.   ED Course:  The patient had a Doppler of his left leg which showed extensive DVT extending up to the left iliac, common femoral and profunda as well as popliteal and calf veins. Vascular surgery was consulted due to the extensiveness of his blood clot.  ROS:   Review of Systems  Constitutional: Positive for weight loss. Negative for chills, fever and malaise/fatigue.  HENT: Negative.   Eyes: Negative.   Respiratory: Negative.   Cardiovascular: Positive for leg swelling. Negative for chest pain, palpitations and claudication.  Gastrointestinal: Positive for constipation. Negative for blood in stool, melena, nausea and vomiting.  Genitourinary: Positive for frequency.  Musculoskeletal: Positive for joint pain and myalgias.       RLE pain but describes it as soreness  Skin: Negative.   Neurological: Negative.   Endo/Heme/Allergies: Bruises/bleeds easily.  Psychiatric/Behavioral: Negative.     Past Medical History:   Past Medical History:  Diagnosis Date  . Arthritis   . BPH (benign prostatic hyperplasia)   . Diverticulitis   . Eczema   . Ileus (HCC)   . Obstruction (acquired) of bladder  neck or vesicourethral orifice   . PAF (paroxysmal atrial fibrillation) (HCC)   . SBO (small bowel obstruction) (HCC)   . Spinal stenosis     Past Surgical History:   Past Surgical History:  Procedure Laterality Date  . CIRCUMCISION    . HERNIA REPAIR     left and right   . left hip fracture surgery     . LUMBAR LAMINECTOMY/DECOMPRESSION MICRODISCECTOMY N/A 07/29/2015   Procedure: DECOMPRESSION L4-L5 and L3-L4,  MICRODISCECTOMY L4-L5     (2 LEVELS);  Surgeon: Jene EveryJeffrey Beane, MD;  Location: [facility];  Service: Orthopedics;  Laterality: N/A;  . TONSILLECTOMY      Social History:   Social History   Social History  . Marital status: Married    Spouse name: N/A  . Number of children: 2  . Years of education: 11th grade   Occupational History  . Retired Music therapistwarehouse/delivery work    Social History Main Topics  . Smoking status: Never Smoker  . Smokeless tobacco: Never Used  . Alcohol use No  . Drug use: No  . Sexual activity: Not on file   Other Topics Concern  . Not on file   Social History Narrative   Married, ambulates with a [employer].     Allergies   Tramadol  Family history:   Family History  Problem Relation Age of Onset  . Stroke Mother   . Heart attack Father   . Hypertension Other   . Heart disease Sister   . Deep vein thrombosis Neg  Hx     Current Medications:   Prior to Admission medications   Medication Sig Start Date End Date Taking? Authorizing Provider  acetaminophen (TYLENOL) 500 MG tablet Take 500 mg by mouth every 6 (six) hours as needed for mild pain.   Yes [provider]  ascorbic acid (VITAMIN C) 500 MG tablet Take 500 mg by mouth daily.   Yes [provider]  b complex vitamins tablet Take 1 tablet by mouth daily.   Yes [provider]  bisacodyl (DULCOLAX) 5 MG EC tablet Take 5 mg by mouth daily as needed for moderate constipation.   Yes [provider]  calcium-vitamin D (OSCAL WITH D) 500-200 MG-UNIT per  tablet Take 1 tablet by mouth daily with breakfast.   Yes [provider]  Cyanocobalamin (VITAMIN B 12 PO) Take 1 tablet by mouth daily.   Yes [provider]  FOLIC ACID PO Take 1 tablet by mouth daily.   Yes [provider]  MAGNESIUM PO Take 1,500 mg by mouth daily.    Yes [provider]  Multiple Vitamin (MULTIVITAMIN WITH MINERALS) TABS tablet Take 1 tablet by mouth daily.   Yes [provider]  TRIAMCINOLONE ACETONIDE EX Apply 1 application topically daily as needed (rash).   Yes [provider]  bisacodyl (DULCOLAX) 10 MG suppository Place 1 suppository (10 mg total) rectally daily as needed for moderate constipation. Patient not taking: Reported on 05/12/2017 10/23/15   Kathlen Mody, MD  docusate sodium (COLACE) 100 MG capsule Take 1 capsule (100 mg total) by mouth 2 (two) times daily as needed for mild constipation. Patient not taking: Reported on 05/12/2017 10/23/15   Kathlen Mody, MD  Rivaroxaban 15 & 20 MG TBPK Take 15 mg by mouth 2 (two) times daily with a meal. Take as directed on package: Start with one 15mg  tablet by mouth twice a day with food. On Day 22, switch to one 20mg  tablet once a day with food. 05/12/17 06/11/17  Georgiana Shore, PA-C  senna (SENOKOT) 8.6 MG TABS tablet Take 1 tablet (8.6 mg total) by mouth daily as needed for mild constipation. Patient not taking: Reported on 05/12/2017 10/23/15   Kathlen Mody, MD  tamsulosin (FLOMAX) 0.4 MG CAPS capsule Take 1 capsule (0.4 mg total) by mouth daily after supper. Patient not taking: Reported on 10/18/2015 07/31/15   Dorothy Spark, [place]    Physical Exam:   Vitals:   05/12/17 1530 05/12/17 1545 05/12/17 1600 05/12/17 1615  BP: 125/87 (!) 146/90 136/87 136/84  Pulse: (!) 108 (!) 130 (!) 102 (!) 103  Resp:      Temp:      TempSrc:      SpO2: 99% 98% 100% 100%  Weight:      Height:         Physical Exam: Blood pressure 136/84, pulse (!) 103, temperature  97.8 F (36.6 C), temperature source Oral, resp. rate 18, height 5\' 8"  (1.727 m), weight 61.2 kg (135 lb), SpO2 100 %. Gen: Elderly appearing male No acute distress. Head: Normocephalic, atraumatic. Eyes: Pupils equal, round and reactive to light. Extraocular movements intact.  Sclerae nonicteric. No lid lag. Mouth: Oropharynx reveals dry mucous membranes with a white coating to the tongue. Edentulous. Neck: Supple, no thyromegaly, no lymphadenopathy, no jugular venous distention. Chest: Lungs are clear to auscultation with good air movement. No rales, rhonchi or wheezes.  CV: Heart sounds are regular with an S1, S2. No murmurs, rubs, clicks, or  gallops.  Abdomen: Soft, nontender, nondistended with normal active bowel sounds. No hepatosplenomegaly or palpable masses. Extremities: Left lower extremity with extensive swelling from his toes up to his groin. Skin: Warm and dry. No rashes, lesions or wounds. Thin skin with scattered ecchymosis. Neuro: Alert and oriented times 3; grossly nonfocal.  Psych: Insight is good and judgment is appropriate. Mood and affect normal.   Data Review:    Labs: Basic Metabolic Panel:  Recent Labs Lab 05/12/17 1139  NA 140  K 4.0  CL 106  CO2 23  GLUCOSE 85  BUN 10  CREATININE 1.09  CALCIUM 9.1   Liver Function Tests: No results for input(s): AST, ALT, ALKPHOS, BILITOT, PROT, ALBUMIN in the last 168 hours. No results for input(s): LIPASE, AMYLASE in the last 168 hours. No results for input(s): AMMONIA in the last 168 hours. CBC:  Recent Labs Lab 05/12/17 1139  WBC 6.3  NEUTROABS 4.1  HGB 13.0  HCT 38.9*  MCV 97.5  PLT 155    Urinalysis    Component Value Date/Time   COLORURINE YELLOW 10/20/2015 1810   APPEARANCEUR CLOUDY (A) 10/20/2015 1810   LABSPEC 1.029 10/20/2015 1810   PHURINE 5.5 10/20/2015 1810   GLUCOSEU NEGATIVE 10/20/2015 1810   HGBUR SMALL (A) 10/20/2015 1810   BILIRUBINUR NEGATIVE 10/20/2015 1810   KETONESUR 40 (A)  10/20/2015 1810   PROTEINUR 30 (A) 10/20/2015 1810   UROBILINOGEN 0.2 07/31/2015 1132   NITRITE NEGATIVE 10/20/2015 1810   LEUKOCYTESUR NEGATIVE 10/20/2015 1810      Radiographic Studies: Ct Angio Chest Pe W/cm &/or Wo Cm  Result Date: 05/12/2017 CLINICAL DATA:  DVT with tachycardia.  Left leg pain. EXAM: CT ANGIOGRAPHY CHEST CT ABDOMEN AND PELVIS WITH CONTRAST TECHNIQUE: Multidetector CT imaging of the chest was performed using the standard protocol during bolus administration of intravenous contrast. Multiplanar CT image reconstructions and MIPs were obtained to evaluate the vascular anatomy. Multidetector CT imaging of the abdomen and pelvis was performed using the standard protocol during bolus administration of intravenous contrast. CONTRAST:  100 cc Isovue 370 intravenous COMPARISON:  None. FINDINGS: CTA CHEST FINDINGS Cardiovascular: Satisfactory opacification of the pulmonary arteries to the segmental level. No evidence of pulmonary embolism. Normal heart size. No pericardial effusion. Atherosclerosis, including throughout the coronaries. Mediastinum/Nodes: Thyroid nodules measuring up to 17 mm on the left, considered incidental in this setting. Negative for adenopathy. Lungs/Pleura: There is no edema, consolidation, effusion, or pneumothorax. Musculoskeletal: Degenerative changes without acute or aggressive finding Review of the MIP images confirms the above findings. CT ABDOMEN and PELVIS FINDINGS Hepatobiliary: No focal liver abnormality.Gallbladder calculi. Cannot exclude wall calcification towards the fundus. No inflammation or aggressive findings. Pancreas: Unremarkable. Spleen: Unremarkable. Adrenals/Urinary Tract: Negative adrenals. No hydronephrosis or stone. Simple appearing bilateral renal cysts. Unremarkable bladder. Stomach/Bowel: No obstruction. Tortuous colon with moderate stool volume. Stool distends the rectum and sigmoid. No inflammatory changes. Vascular/Lymphatic: Asymmetric  non enhancement of the left iliac and common femoral veins with expansion and indistinctness around the left common femoral vein. Aortic atherosclerosis. No mass or adenopathy. Reproductive:No pathologic findings. Other: No ascites or pneumoperitoneum. Musculoskeletal: No acute finding. Left femoral neck fracture repair. Severe right hip osteoarthritis with bone-on-bone contact. Lumbar spine degeneration with L4 laminectomy Review of the MIP images confirms the above findings. IMPRESSION: Chest CTA: No evidence of pulmonary embolism. Abdominal CT: 1. Patient's known DVT extends to the left common iliac/IVC confluence. 2. No other acute finding. 3. Moderate stool volume, distending the rectum and sigmoid. 4.  Cholelithiasis. Superimposed gallbladder wall calcification could be present. Electronically Signed   By: Marnee Spring M.D.   On: 05/12/2017 15:09   Ct Abdomen Pelvis W Contrast  Result Date: 05/12/2017 CLINICAL DATA:  DVT with tachycardia.  Left leg pain. EXAM: CT ANGIOGRAPHY CHEST CT ABDOMEN AND PELVIS WITH CONTRAST TECHNIQUE: Multidetector CT imaging of the chest was performed using the standard protocol during bolus administration of intravenous contrast. Multiplanar CT image reconstructions and MIPs were obtained to evaluate the vascular anatomy. Multidetector CT imaging of the abdomen and pelvis was performed using the standard protocol during bolus administration of intravenous contrast. CONTRAST:  100 cc Isovue 370 intravenous COMPARISON:  None. FINDINGS: CTA CHEST FINDINGS Cardiovascular: Satisfactory opacification of the pulmonary arteries to the segmental level. No evidence of pulmonary embolism. Normal heart size. No pericardial effusion. Atherosclerosis, including throughout the coronaries. Mediastinum/Nodes: Thyroid nodules measuring up to 17 mm on the left, considered incidental in this setting. Negative for adenopathy. Lungs/Pleura: There is no edema, consolidation, effusion, or  pneumothorax. Musculoskeletal: Degenerative changes without acute or aggressive finding Review of the MIP images confirms the above findings. CT ABDOMEN and PELVIS FINDINGS Hepatobiliary: No focal liver abnormality.Gallbladder calculi. Cannot exclude wall calcification towards the fundus. No inflammation or aggressive findings. Pancreas: Unremarkable. Spleen: Unremarkable. Adrenals/Urinary Tract: Negative adrenals. No hydronephrosis or stone. Simple appearing bilateral renal cysts. Unremarkable bladder. Stomach/Bowel: No obstruction. Tortuous colon with moderate stool volume. Stool distends the rectum and sigmoid. No inflammatory changes. Vascular/Lymphatic: Asymmetric non enhancement of the left iliac and common femoral veins with expansion and indistinctness around the left common femoral vein. Aortic atherosclerosis. No mass or adenopathy. Reproductive:No pathologic findings. Other: No ascites or pneumoperitoneum. Musculoskeletal: No acute finding. Left femoral neck fracture repair. Severe right hip osteoarthritis with bone-on-bone contact. Lumbar spine degeneration with L4 laminectomy Review of the MIP images confirms the above findings. IMPRESSION: Chest CTA: No evidence of pulmonary embolism. Abdominal CT: 1. Patient's known DVT extends to the left common iliac/IVC confluence. 2. No other acute finding. 3. Moderate stool volume, distending the rectum and sigmoid. 4. Cholelithiasis. Superimposed gallbladder wall calcification could be present. Electronically Signed   By: Marnee Spring M.D.   On: 05/12/2017 15:09    EKG: No EKG was done.   Assessment/Plan:   Principal Problem:   DVT, lower extremity, proximal, acute, left (HCC) The patient has extensive clot in the left leg and likely will need catheter directed thrombolysis to help prevent post phlebitis syndrome. Vascular surgery has been consulted with plans to proceed with intervention tomorrow. There are no identified contraindications. We'll  place on IV heparin in the meantime. The patient does not have any family history of clotting disorder and he has not recently traveled, though he is relatively immobile which may be the triggering event. Underlying malignancy is not excluded and will likely need further outpatient evaluation to address this question.  Active Problems:   Chronic constipation Continue home regimen and add as needed MiraLAX and sorbitol.    Spinal stenosis of lumbar region Patient has chronic debility, limited mobility and right leg pain related to this. Tylenol ordered as needed.    Weight loss/protein calorie malnutrition Body mass index is 20.53 kg/m. Dietitian consultation.  Other information:   DVT prophylaxis: Heparin ordered. Code Status: Full code. Family Communication: Wife updated the bedside. Disposition Plan: Likely will need several days in the hospital. Consults called: Vascular surgery: Dr. Wynetta Emery. Admission status: Inpatient.   Attestation regarding necessity of inpatient status:   The appropriate  admission status for this patient is INPATIENT. Inpatient status is judged to be reasonable and necessary in order to provide the required intensity of service to ensure the patient's safety. The patient's presenting symptoms, physical exam findings, and initial radiographic and laboratory data in the context of their chronic comorbidities is felt to place them at high risk for further clinical deterioration. Furthermore, it is not anticipated that the patient will be medically stable for discharge from the hospital within 2 midnights of admission. The following factors support the admission status of inpatient.    The patient's presenting symptoms include Severe swelling of the left lower extremity.  The worrisome physical exam findings include severe swelling of the left lower extremity..  The initial radiographic and laboratory data are worrisome because of extensive left lower extremity  DVT.  Patient will need an anticipated catheter directed therapy with vascular surgery intervention.   * I certify that at the point of admission it is my clinical judgment that the patient will require inpatient hospital care spanning beyond 2 midnights from the point of admission due to high intensity of service, high risk for further deterioration and high frequency of surveillance required.*    The medical decision making on this patient was of high complexity and the patient is at high risk for clinical deterioration, therefore this is a level 3 visit.    Chanel Mcadams [facility] Pager [phone] Cell: [phone]   If 7PM-7AM, please contact night-coverage [email or web address] Password [facility] 05/12/2017, 4:58 PM

## 2017-05-12 NOTE — Consult Note (Signed)
[facility] [place]  Patient name: Derek Bentley [place] MRN: [id number] DOB: 08/09/1936 Sex: male  REASON FOR CONSULT: left leg DVT, consult is from ED provider   HPI:   Derek Bentley T[place]o is a 81 y.o. male, who presents with 10 day history of left foot swelling. Then a few days after that, his left leg started swelling. He denies any pain or numbness in his left leg. He denies any prior history of DVT. No prior history of leg swelling. No malignancy. No unintended weight loss or night sweats.  No family history of DVT. No recent travel, prolonged immobility or recent surgery. Denies any history of CVA. No history of bleeding. No known history of kidney problems. Denies any chest pain or shortness of breath.   At home, he says he doesn't walk much, "just around the house." He uses a walker to get around.  Has not had any falls.  He states that since his back surgery in 2016, he has been having problems with his legs. He denies any claudication. Does report pain in the knee and hip joints when walking.   Denies any history of cardiac issues. Has history of sigmoidectomy and inguinal hernia repairs.   Denies any non healing wounds.   Has never been a smoker. Does not drink alcohol or use any drugs.   Past Medical History:  Diagnosis Date  . Arthritis   . BPH (benign prostatic hyperplasia)   . Diverticulitis   . Eczema   . Ileus (HCC)   . Obstruction (acquired) of bladder neck or vesicourethral orifice   . PAF (paroxysmal atrial fibrillation) (HCC)   . SBO (small bowel obstruction) (HCC)   . Spinal stenosis     Family History  Problem Relation Age of Onset  . Stroke Mother   . Heart attack Father   . Hypertension Other   . Heart disease Sister   . Deep vein thrombosis Neg Hx      SOCIAL HISTORY:   Social History   Social History  . Marital status: Married    Spouse name: N/A  . Number of children: 2  . Years of education: 11th grade   Occupational  History  . Retired Music therapistwarehouse/delivery work    Social History Main Topics  . Smoking status: Never Smoker  . Smokeless tobacco: Never Used  . Alcohol use No  . Drug use: No  . Sexual activity: Not on file   Other Topics Concern  . Not on file   Social History Narrative   Married, ambulates with a [employer].     Allergies  Allergen Reactions  . Tramadol Other (See Comments)    Hiccups and constipation    MEDICATIONS:    Current Facility-Administered Medications  Medication Dose Route Frequency Provider Last Rate Last Dose  . heparin bolus via infusion 3,000 Units  3,000 Units Intravenous Once Arby BarrettePfeiffer, Marcy, MD       Followed by  . heparin ADULT infusion 100 units/mL (25000 units/27350mL sodium chloride 0.45%)  1,000 Units/hr Intravenous Continuous Arby BarrettePfeiffer, Marcy, MD       Current Outpatient Prescriptions  Medication Sig Dispense Refill  . acetaminophen (TYLENOL) 500 MG tablet Take 500 mg by mouth every 6 (six) hours as needed for mild pain.    Marland Kitchen. ascorbic acid (VITAMIN C) 500 MG tablet Take 500 mg by mouth daily.    Marland Kitchen. b complex vitamins tablet Take 1 tablet by mouth daily.    . bisacodyl (DULCOLAX)  5 MG EC tablet Take 5 mg by mouth daily as needed for moderate constipation.    . calcium-vitamin D (OSCAL WITH D) 500-200 MG-UNIT per tablet Take 1 tablet by mouth daily with breakfast.    . Cyanocobalamin (VITAMIN B 12 PO) Take 1 tablet by mouth daily.    Marland Kitchen FOLIC ACID PO Take 1 tablet by mouth daily.    Marland Kitchen MAGNESIUM PO Take 1,500 mg by mouth daily.     . Multiple Vitamin (MULTIVITAMIN WITH MINERALS) TABS tablet Take 1 tablet by mouth daily.    . TRIAMCINOLONE ACETONIDE EX Apply 1 application topically daily as needed (rash).    . bisacodyl (DULCOLAX) 10 MG suppository Place 1 suppository (10 mg total) rectally daily as needed for moderate constipation. (Patient not taking: Reported on 05/12/2017) 12 suppository 0  . docusate sodium (COLACE) 100 MG capsule Take 1 capsule (100 mg total)  by mouth 2 (two) times daily as needed for mild constipation. (Patient not taking: Reported on 05/12/2017) 20 capsule 1  . Rivaroxaban 15 & 20 MG TBPK Take 15 mg by mouth 2 (two) times daily with a meal. Take as directed on package: Start with one 15mg  tablet by mouth twice a day with food. On Day 22, switch to one 20mg  tablet once a day with food. 51 each 0  . senna (SENOKOT) 8.6 MG TABS tablet Take 1 tablet (8.6 mg total) by mouth daily as needed for mild constipation. (Patient not taking: Reported on 05/12/2017) 20 each 0  . tamsulosin (FLOMAX) 0.4 MG CAPS capsule Take 1 capsule (0.4 mg total) by mouth daily after supper. (Patient not taking: Reported on 10/18/2015) 30 capsule 1    REVIEW OF SYSTEMS:    REVIEW OF SYSTEMS (negative unless checked):   Cardiac:  []  Chest pain or chest pressure? []  Shortness of breath upon activity? []  Shortness of breath when lying flat? []  Irregular heart rhythm?  Vascular:  []  Pain in calf, thigh, or hip brought on by walking? []  Pain in feet at night that wakes you up from your sleep? [x]  Blood clot in your veins? [x]  Leg swelling?  Pulmonary:  []  Oxygen at home? []  Productive cough? []  Wheezing?  Neurologic:  []  Sudden weakness in arms or legs? []  Sudden numbness in arms or legs? []  Sudden onset of difficult speaking or slurred speech? []  Temporary loss of vision in one eye? []  Problems with dizziness?  Gastrointestinal:  []  Blood in stool? []  Vomited blood?  Genitourinary:  []  Burning when urinating? []  Blood in urine?  Psychiatric:  []  Major depression  Hematologic:  []  Bleeding problems? []  Problems with blood clotting?  Dermatologic:  []  Rashes or ulcers?  Constitutional:  []  Fever or chills?  Ear/Nose/Throat:  []  Change in hearing? []  Nose bleeds? []  Sore throat?  Musculoskeletal:  []  Back pain? []  Joint pain? []  Muscle pain?  PHYSICAL EXAM:    Vitals:   05/12/17 1530 05/12/17 1545 05/12/17 1600 05/12/17 1615   BP: 125/87 (!) 146/90 136/87 136/84  Pulse: (!) 108 (!) 130 (!) 102 (!) 103  Resp:      Temp:      TempSrc:      SpO2: 99% 98% 100% 100%  Weight:      Height:        GENERAL: The patient is a well-nourished male, in no acute distress. The vital signs are documented above. HEENT: normocephalic, atraumatic, no abnormalities noted.  CARDIAC: There is a regular rate and rhythm. No carotid  bruits.  VASCULAR: 2+ radial pulses bilaterally. 2+ right femoral. 1+ left femoral.  2+ left popliteal. Non palpable pedal pulses. Left leg is swollen from thigh down to foot. No ischemic changes. Left foot is warm.  PULMONARY: There is good air exchange bilaterally without wheezing or rales. ABDOMEN: Soft and non-tender.  MUSCULOSKELETAL: There are no major deformities or cyanosis. NEUROLOGIC: No numbness or pain left foot. Moving toes and ankle.  SKIN: There are no ulcers or rashes noted. PSYCHIATRIC: The patient has a normal affect.  DATA:    CT abdomen/pelvis 05/12/17:  1. Patient's known DVT extends to the left common iliac/IVC confluence. 2. No other acute finding. 3. Moderate stool volume, distending the rectum and sigmoid. 4. Cholelithiasis. Superimposed gallbladder wall calcification could be present.  CTA chest 05/12/17:  negative for PE  Left lower extremity venous duplex 05/12/17  Preliminary findings: The left lower extremity is positive for deep vein thrombosis in the left iliac, common femoral, profunda, femoral, popliteal and calf veins.  Visualized veins of the right lower extremity appear negative for thrombosis.  Attempt was made to image IVC  But it is completely obscured by bowel and gas.   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 Perhaps it is from his general sedentary lifestyle. He reports that he doesn't  walk much and only ambulates in the house with a walker. Discussed thrombolysis and iliac stenting versus observation and anticoagulation. No contraindications to lysis. The patient initially was agreeable to a procedure but then stated that he does not want a procedure given the risks associated with it. This is fair given the patient's age and mostly sedentary lifestyle.   Hospitalist service to admit overnight for observation. Can start oral anticoagulation. Will follow.   Maris Berger, PA-C [facility] [place] [facility] [phone]   I have independently interviewed patient and agree with PA assessment and plan above. He was offered intervention given extensive dvt and likely obstructive component of left common iliac vein. Given his low level of activity he does not want to proceed now and I think that is fair. He will need at least 3 months of anticoagulation but should be considered for longer given the obstruction that will persist. Will check on tomorrow.   Clarie Camey C. Randie Heinz, MD [facility] Office: [phone] Pager: [phone]

## 2017-05-12 NOTE — Progress Notes (Signed)
ANTICOAGULATION CONSULT NOTE - Initial Consult  Pharmacy Consult for Heparin Indication: DVT  Allergies  Allergen Reactions  . Tramadol Other (See Comments)    Hiccups and constipation    Patient Measurements: Height: 5\' 8"  (172.7 cm) Weight: 135 lb (61.2 kg) IBW/kg (Calculated) : 68.4 Heparin Dosing Weight: 61.2 kg  Vital Signs: Temp: 97.8 F (36.6 C) (07/13 1104) Temp Source: Oral (07/13 1104) BP: 136/87 (07/13 1600) Pulse Rate: 102 (07/13 1600)  Labs:  Recent Labs  05/12/17 1139  HGB 13.0  HCT 38.9*  PLT 155  CREATININE 1.09    Estimated Creatinine Clearance: 48.3 mL/min (by C-G formula based on SCr of 1.09 mg/dL).   Medical History: Past Medical History:  Diagnosis Date  . Arthritis    Assessment: [age] yoM with DVT of LLE. CBC good. No anticoagulation prior to admission. Initially planning to use Xarelto; however, pharmacy has now been consulted to start heparin. No doses of xarelto given.  No bleeding per patient.  Goal of Therapy:  Heparin level 0.3-0.7 units/ml Monitor platelets by anticoagulation protocol: Yes   Plan:  Heparin 3000 unit bolus Start heparin 1000 units/hr 8 hour heparin level Daily Heparin level and CBC Monitor s/sx of bleeding Follow plans for oral anticoagulation  Alfredo BachJoseph Arminger, PharmD Clinical Pharmacist [phone]) 05/12/2017 4:32 PM

## 2017-05-12 NOTE — ED Provider Notes (Signed)
[facility]-EMERGENCY DEPT Provider Note   CSN: [id number] Arrival date & time: 05/12/17  1057     History   Chief Complaint Chief Complaint  Patient presents with  . Leg Pain    HPI Derek Bentley is a 81 y.o. male presenting via EMS with unilateral left lower extremity swelling. He explains that his foot was swollen about 6 weeks ago and it got progressively worse especially over the last 5 days which has now spread up and the swelling reaches his groin on the left side now. Patient denies any pain in his extremity just swelling. He also reports that his right foot was swollen for a little while and resolved on its own. He denies history of DVT/PE, malignancy, recent surgeries, prolonged immobilization, cough, shortness of breath, chest pain, hemoptysis, fever or chills.  HPI  Past Medical History:  Diagnosis Date  . Arthritis   . BPH (benign prostatic hyperplasia)   . Diverticulitis   . Eczema   . Ileus (HCC)   . Obstruction (acquired) of bladder neck or vesicourethral orifice   . PAF (paroxysmal atrial fibrillation) (HCC)   . SBO (small bowel obstruction) (HCC)   . Spinal stenosis     Patient Active Problem List   Diagnosis Date Noted  . DVT, lower extremity, proximal, acute, left (HCC) 05/12/2017  . DVT (deep venous thrombosis) (HCC) 05/12/2017  . Spinal stenosis of lumbar region 07/29/2015  . Chronic constipation 10/01/2008    Past Surgical History:  Procedure Laterality Date  . CIRCUMCISION    . HERNIA REPAIR     left and right   . left hip fracture surgery     . LUMBAR LAMINECTOMY/DECOMPRESSION MICRODISCECTOMY N/A 07/29/2015   Procedure: DECOMPRESSION L4-L5 and L3-L4,  MICRODISCECTOMY L4-L5     (2 LEVELS);  Surgeon: Jene Every, MD;  Location: [facility] ORS;  Service: Orthopedics;  Laterality: N/A;  . TONSILLECTOMY         Home Medications    Prior to Admission medications   Medication Sig Start Date End Date Taking? Authorizing Provider  acetaminophen (TYLENOL)  500 MG tablet Take 500 mg by mouth every 6 (six) hours as needed for mild pain.   Yes [provider]  ascorbic acid (VITAMIN C) 500 MG tablet Take 500 mg by mouth daily.   Yes [provider]  b complex vitamins tablet Take 1 tablet by mouth daily.   Yes [provider]  bisacodyl (DULCOLAX) 5 MG EC tablet Take 5 mg by mouth daily as needed for moderate constipation.   Yes [provider]  calcium-vitamin D (OSCAL WITH D) 500-200 MG-UNIT per tablet Take 1 tablet by mouth daily with breakfast.   Yes [provider]  Cyanocobalamin (VITAMIN B 12 PO) Take 1 tablet by mouth daily.   Yes [provider]  FOLIC ACID PO Take 1 tablet by mouth daily.   Yes [provider]  MAGNESIUM PO Take 1,500 mg by mouth daily.    Yes [provider]  Multiple Vitamin (MULTIVITAMIN WITH MINERALS) TABS tablet Take 1 tablet by mouth daily.   Yes [provider]  TRIAMCINOLONE ACETONIDE EX Apply 1 application topically daily as needed (rash).   Yes [provider]  bisacodyl (DULCOLAX) 10 MG suppository Place 1 suppository (10 mg total) rectally daily as needed for moderate constipation. Patient not taking: Reported on 05/12/2017 10/23/15   Kathlen Mody, MD  docusate sodium (COLACE) 100 MG capsule Take 1 capsule (100 mg total) by mouth 2 (  two) times daily as needed for mild constipation. Patient not taking: Reported on 05/12/2017 10/23/15   Kathlen ModyAkula, Vijaya, MD  senna (SENOKOT) 8.6 MG TABS tablet Take 1 tablet (8.6 mg total) by mouth daily as needed for mild constipation. Patient not taking: Reported on 05/12/2017 10/23/15   Kathlen ModyAkula, Vijaya, MD  tamsulosin (FLOMAX) 0.4 MG CAPS capsule Take 1 capsule (0.4 mg total) by mouth daily after supper. Patient not taking: Reported on 10/18/2015 07/31/15   Dorothy SparkBissell, Jaclyn M, PA-C    Family History Family History  Problem Relation Age of Onset  . Stroke Mother   . Heart attack Father   .  Hypertension Other   . Heart disease Sister   . Deep vein thrombosis Neg Hx     Social History Social History  Substance Use Topics  . Smoking status: Never Smoker  . Smokeless tobacco: Never Used  . Alcohol use No     Allergies   Tramadol   Review of Systems Review of Systems  Constitutional: Negative for chills, diaphoresis and fever.  HENT: Negative for ear pain and sore throat.   Eyes: Negative for pain and visual disturbance.  Respiratory: Negative for cough, choking, chest tightness, shortness of breath, wheezing and stridor.   Cardiovascular: Positive for leg swelling. Negative for chest pain and palpitations.  Gastrointestinal: Negative for abdominal distention, abdominal pain, nausea and vomiting.  Genitourinary: Negative for dysuria and hematuria.  Musculoskeletal: Negative for arthralgias, back pain, joint swelling, myalgias, neck pain and neck stiffness.  Skin: Negative for color change, pallor, rash and wound.  Neurological: Negative for seizures, syncope, weakness and numbness.     Physical Exam Updated Vital Signs BP (!) 141/94   Pulse (!) 114   Temp 97.8 F (36.6 C) (Oral)   Resp 18   Ht 5\' 8"  (1.727 m)   Wt 61.2 kg (135 lb)   SpO2 100%   BMI 20.53 kg/m   Physical Exam  Constitutional: He appears well-developed and well-nourished. No distress.  Patient's afebrile, nontoxic appearing lying comfortably in bed in no acute distress.  HENT:  Head: Normocephalic and atraumatic.  Eyes: Conjunctivae and EOM are normal.  Neck: Normal range of motion.  Cardiovascular: Normal rate, regular rhythm, normal heart sounds and intact distal pulses.   No murmur heard. Pulmonary/Chest: Effort normal and breath sounds normal. No respiratory distress. He has no wheezes. He has no rales. He exhibits no tenderness.  Abdominal: He exhibits no distension.  Musculoskeletal: Normal range of motion. He exhibits edema. He exhibits no tenderness or deformity.  Patient with  2+ pitting edema left foot and obvious swelling up calf and thigh left. No tenderness palpation of the lower extremities. Dorsalis pedis pulses confirmed with Doppler  Neurological: He is alert. No sensory deficit.  Skin: Skin is warm and dry. No rash noted. He is not diaphoretic. There is erythema. No pallor.  Mild erythema over the dorsum of the foot, no warmth.  Psychiatric: He has a normal mood and affect.  Nursing note and vitals reviewed.    ED Treatments / Results  Labs (all labs ordered are listed, but only abnormal results are displayed) Labs Reviewed  CBC WITH DIFFERENTIAL/PLATELET - Abnormal; Notable for the following:       Result Value   RBC 3.99 (*)    HCT 38.9 (*)    All other components within normal limits  BASIC METABOLIC PANEL  HEPARIN LEVEL (UNFRACTIONATED)  CBC    EKG  EKG Interpretation None  Radiology Ct Angio Chest Pe W/cm &/or Wo Cm  Result Date: 05/12/2017 CLINICAL DATA:  DVT with tachycardia.  Left leg pain. EXAM: CT ANGIOGRAPHY CHEST CT ABDOMEN AND PELVIS WITH CONTRAST TECHNIQUE: Multidetector CT imaging of the chest was performed using the standard protocol during bolus administration of intravenous contrast. Multiplanar CT image reconstructions and MIPs were obtained to evaluate the vascular anatomy. Multidetector CT imaging of the abdomen and pelvis was performed using the standard protocol during bolus administration of intravenous contrast. CONTRAST:  100 cc Isovue 370 intravenous COMPARISON:  None. FINDINGS: CTA CHEST FINDINGS Cardiovascular: Satisfactory opacification of the pulmonary arteries to the segmental level. No evidence of pulmonary embolism. Normal heart size. No pericardial effusion. Atherosclerosis, including throughout the coronaries. Mediastinum/Nodes: Thyroid nodules measuring up to 17 mm on the left, considered incidental in this setting. Negative for adenopathy. Lungs/Pleura: There is no edema, consolidation, effusion, or  pneumothorax. Musculoskeletal: Degenerative changes without acute or aggressive finding Review of the MIP images confirms the above findings. CT ABDOMEN and PELVIS FINDINGS Hepatobiliary: No focal liver abnormality.Gallbladder calculi. Cannot exclude wall calcification towards the fundus. No inflammation or aggressive findings. Pancreas: Unremarkable. Spleen: Unremarkable. Adrenals/Urinary Tract: Negative adrenals. No hydronephrosis or stone. Simple appearing bilateral renal cysts. Unremarkable bladder. Stomach/Bowel: No obstruction. Tortuous colon with moderate stool volume. Stool distends the rectum and sigmoid. No inflammatory changes. Vascular/Lymphatic: Asymmetric non enhancement of the left iliac and common femoral veins with expansion and indistinctness around the left common femoral vein. Aortic atherosclerosis. No mass or adenopathy. Reproductive:No pathologic findings. Other: No ascites or pneumoperitoneum. Musculoskeletal: No acute finding. Left femoral neck fracture repair. Severe right hip osteoarthritis with bone-on-bone contact. Lumbar spine degeneration with L4 laminectomy Review of the MIP images confirms the above findings. IMPRESSION: Chest CTA: No evidence of pulmonary embolism. Abdominal CT: 1. Patient's known DVT extends to the left common iliac/IVC confluence. 2. No other acute finding. 3. Moderate stool volume, distending the rectum and sigmoid. 4. Cholelithiasis. Superimposed gallbladder wall calcification could be present. Electronically Signed   By: Marnee Spring M.D.   On: 05/12/2017 15:09   Ct Abdomen Pelvis W Contrast  Result Date: 05/12/2017 CLINICAL DATA:  DVT with tachycardia.  Left leg pain. EXAM: CT ANGIOGRAPHY CHEST CT ABDOMEN AND PELVIS WITH CONTRAST TECHNIQUE: Multidetector CT imaging of the chest was performed using the standard protocol during bolus administration of intravenous contrast. Multiplanar CT image reconstructions and MIPs were obtained to evaluate the  vascular anatomy. Multidetector CT imaging of the abdomen and pelvis was performed using the standard protocol during bolus administration of intravenous contrast. CONTRAST:  100 cc Isovue 370 intravenous COMPARISON:  None. FINDINGS: CTA CHEST FINDINGS Cardiovascular: Satisfactory opacification of the pulmonary arteries to the segmental level. No evidence of pulmonary embolism. Normal heart size. No pericardial effusion. Atherosclerosis, including throughout the coronaries. Mediastinum/Nodes: Thyroid nodules measuring up to 17 mm on the left, considered incidental in this setting. Negative for adenopathy. Lungs/Pleura: There is no edema, consolidation, effusion, or pneumothorax. Musculoskeletal: Degenerative changes without acute or aggressive finding Review of the MIP images confirms the above findings. CT ABDOMEN and PELVIS FINDINGS Hepatobiliary: No focal liver abnormality.Gallbladder calculi. Cannot exclude wall calcification towards the fundus. No inflammation or aggressive findings. Pancreas: Unremarkable. Spleen: Unremarkable. Adrenals/Urinary Tract: Negative adrenals. No hydronephrosis or stone. Simple appearing bilateral renal cysts. Unremarkable bladder. Stomach/Bowel: No obstruction. Tortuous colon with moderate stool volume. Stool distends the rectum and sigmoid. No inflammatory changes. Vascular/Lymphatic: Asymmetric non enhancement of the left iliac and common femoral veins with  expansion and indistinctness around the left common femoral vein. Aortic atherosclerosis. No mass or adenopathy. Reproductive:No pathologic findings. Other: No ascites or pneumoperitoneum. Musculoskeletal: No acute finding. Left femoral neck fracture repair. Severe right hip osteoarthritis with bone-on-bone contact. Lumbar spine degeneration with L4 laminectomy Review of the MIP images confirms the above findings. IMPRESSION: Chest CTA: No evidence of pulmonary embolism. Abdominal CT: 1. Patient's known DVT extends to the left  common iliac/IVC confluence. 2. No other acute finding. 3. Moderate stool volume, distending the rectum and sigmoid. 4. Cholelithiasis. Superimposed gallbladder wall calcification could be present. Electronically Signed   By: Marnee Spring M.D.   On: 05/12/2017 15:09    Procedures Procedures (including critical care time)  Medications Ordered in ED Medications  heparin bolus via infusion 3,000 Units (3,000 Units Intravenous Bolus from Bag 05/12/17 1723)    Followed by  heparin ADULT infusion 100 units/mL (25000 units/23mL sodium chloride 0.45%) (1,000 Units/hr Intravenous New Bag/Given 05/12/17 1724)  iopamidol (ISOVUE-370) 76 % injection (100 mLs Intravenous Contrast Given 05/12/17 1425)     Initial Impression / Assessment and Plan / ED Course  I have reviewed the triage vital signs and the nursing notes.  Pertinent labs & imaging results that were available during my care of the patient were reviewed by me and considered in my medical decision making (see chart for details).    Patient presenting with worsening left lower extremity edema which has been progressing over the last few days, no pain. No history of DVT. No chest pain, shortness of breath.  Ordered lower extremity DVT study and basic labs Patient afebrile nontoxic appearing, no other complaints at this time. Will reassess.  On reassessment, patient was still comfortable and was hoping he could go home today.  LE DVT study revealed DVT. CT showed DVT extending to iliac/IVC junction.  Consulted vascular surgery who came to evaluate patient. Patient refused any intervention at this time. Ordered heparin per pharmacy consult.  Spoke to hospitalist who will admit for anticoagulant therapy and observation. Patient was discussed with Dr. Donnald Garre who has seen patient and agrees with assessment and plan.  Final Clinical Impressions(s) / ED Diagnoses   Final diagnoses:  Acute deep vein thrombosis (DVT) of iliac vein of left  lower extremity [facility])    New Prescriptions Current Discharge Medication List       Gregary Cromer 05/12/17 1821    Arby Barrette, MD 05/18/17 1657

## 2017-05-12 NOTE — ED Notes (Signed)
Spoke with pharmacist regarding Xarelto packet.  Provider stated did not ordered one time does waiting for CT results.

## 2017-05-12 NOTE — ED Triage Notes (Signed)
Arrived by EMS from home patient developed left lower extremity edema starting with the foot and recently radiating to left thigh. States also has pain right lower extremity with no swelling. Alert answering and following commands appropriate.

## 2017-05-12 NOTE — ED Notes (Signed)
Doctor at bedside.

## 2017-05-13 DIAGNOSIS — M48061 Spinal stenosis, lumbar region without neurogenic claudication: Secondary | ICD-10-CM

## 2017-05-13 DIAGNOSIS — I82422 Acute embolism and thrombosis of left iliac vein: Principal | ICD-10-CM

## 2017-05-13 LAB — CBC
HEMATOCRIT: 35.7 % — AB (ref 39.0–52.0)
HEMOGLOBIN: 11.7 g/dL — AB (ref 13.0–17.0)
MCH: 32.1 pg (ref 26.0–34.0)
MCHC: 32.8 g/dL (ref 30.0–36.0)
MCV: 98.1 fL (ref 78.0–100.0)
Platelets: 151 10*3/uL (ref 150–400)
RBC: 3.64 MIL/uL — AB (ref 4.22–5.81)
RDW: 12.9 % (ref 11.5–15.5)
WBC: 7.3 10*3/uL (ref 4.0–10.5)

## 2017-05-13 LAB — BASIC METABOLIC PANEL
ANION GAP: 8 (ref 5–15)
BUN: 10 mg/dL (ref 6–20)
CHLORIDE: 109 mmol/L (ref 101–111)
CO2: 22 mmol/L (ref 22–32)
Calcium: 8.2 mg/dL — ABNORMAL LOW (ref 8.9–10.3)
Creatinine, Ser: 0.92 mg/dL (ref 0.61–1.24)
GFR calc non Af Amer: >60 mL/min (ref >60)
Glucose, Bld: 77 mg/dL (ref 65–99)
POTASSIUM: 4 mmol/L (ref 3.5–5.1)
SODIUM: 139 mmol/L (ref 135–145)

## 2017-05-13 LAB — HEPARIN LEVEL (UNFRACTIONATED)
HEPARIN UNFRACTIONATED: 0.69 [IU]/mL (ref 0.30–0.70)
Heparin Unfractionated: 1.1 IU/mL — ABNORMAL HIGH (ref 0.30–0.70)

## 2017-05-13 MED ORDER — RIVAROXABAN 20 MG PO TABS: 20.0000 mg | ORAL_TABLET | Freq: Every day | ORAL | Status: DC

## 2017-05-13 MED ORDER — LOPERAMIDE HCL 2 MG PO CAPS: 2.0000 mg | ORAL_CAPSULE | ORAL | Status: DC | PRN

## 2017-05-13 MED ORDER — RIVAROXABAN 15 MG PO TABS
15.0000 mg | ORAL_TABLET | Freq: Two times a day (BID) | ORAL | Status: DC
  Administered 2017-05-13 – 2017-05-14 (×2): 15 mg via ORAL
  Filled 2017-05-13 (×3): qty 1

## 2017-05-13 NOTE — Progress Notes (Signed)
PROGRESS NOTE  Derek Bentley WUJ:[id number] DOB: 03/05/1936 DOA: 05/12/2017 PCP: Patient, No Pcp Per   LOS: 1 day   Brief Narrative / Interim history: Derek Bentley is an 81 y.o. male with a PMH of spinal stenosis of L3-L4 and L4-L5 who underwent a decompression and microdiscectomy 07/2015 but who has had limited mobility since that time who presents with a 7-10 day history of worsening left lower extremity swelling. The patient denies significant pain on the left, although he does have chronic pain in his right leg and hip. He denies any recent travel. He is able to ambulate household distances with a 4 wheeled walker. He does not have any history of blood clotting problems.  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 Randie Heinz today, okay to anticoagulate, will start Xarelto.  Care management consulted for assistance with medication   DVT prophylaxis: Currently on Xarelto Code Status: Full code Family Communication: Discussed with wife at bedside as well as with son over the phone Disposition Plan: home in am   Consultants:   Vascular surgery  Procedures:   None  Antimicrobials:  None  Subjective: - no chest pain, shortness of breath, no abdominal pain, nausea or vomiting.   Objective: Vitals:   05/12/17 1800 05/12/17 1858 05/13/17 0510 05/13/17 1320  BP: (!) 141/94 123/81 (!) 107/48 121/73  Pulse: (!) 114 (!) 102 75 94  Resp:  18 18 17   Temp:  98.4 F (36.9 C) 98.4 F (36.9 C) 97.9 F (36.6 C)  TempSrc:  Oral Oral Oral  SpO2: 100% 99% 97% 100%  Weight:  62.6 kg (138 lb) 61.7 kg (136 lb)   Height:  5\' 8"  (1.727 m)       Intake/Output Summary (Last 24 hours) at 05/13/17 1417 Last data filed at 05/13/17 1316  Gross per 24 hour  Intake           812.67 ml  Output             1150 ml  Net          -337.33 ml   Filed Weights   05/12/17 1104 05/12/17 1858 05/13/17 0510  Weight: 61.2 kg (135 lb) 62.6 kg (138 lb) 61.7 kg (136 lb)    Examination:  Vitals:   05/12/17 1800 05/12/17 1858 05/13/17 0510 05/13/17 1320  BP: (!) 141/94 123/81 (!) 107/48 121/73  Pulse: (!) 114 (!) 102 75 94  Resp:  18 18 17   Temp:  98.4 F (36.9 C) 98.4 F (36.9 C) 97.9 F (36.6 C)  TempSrc:  Oral Oral Oral  SpO2: 100% 99% 97% 100%  Weight:  62.6 kg (138 lb) 61.7 kg (136 lb)   Height:  5\' 8"  (1.727 m)      Constitutional: NAD Neck: normal, supple, no masses, no thyromegaly Respiratory: clear to auscultation bilaterally, no wheezing, no crackles.  Cardiovascular: Regular rate and rhythm, no murmurs / rubs / gallops. No LE edema. 2+ pedal pulses. No carotid bruits.  Abdomen: no tenderness. Bowel sounds positive.  Skin: no rashes, lesions, ulcers. No induration Neurologic: non focal    Data Reviewed: I have independently reviewed following labs and imaging studies  CBC:  Recent Labs Lab 05/12/17 1139 05/13/17 0214  WBC 6.3 7.3  NEUTROABS 4.1  --   HGB 13.0 11.7*  HCT 38.9* 35.7*  MCV 97.5 98.1  PLT 155 151   Basic Metabolic Panel:  Recent Labs Lab 05/12/17 1139 05/13/17 0214  NA 140 139  K 4.0 4.0  CL 106 109  CO2 23 22  GLUCOSE 85 77  BUN 10 10  CREATININE 1.09 0.92  CALCIUM 9.1 8.2*   GFR: Estimated Creatinine Clearance: 57.8 mL/min (by C-G formula based on SCr of 0.92 mg/dL). Liver Function Tests: No results for input(s): AST, ALT, ALKPHOS, BILITOT, PROT, ALBUMIN in the last 168 hours. No results for input(s): LIPASE, AMYLASE in the last 168 hours. No results for input(s): AMMONIA in the last 168 hours. Coagulation Profile: No results for input(s): INR, PROTIME in the last 168  hours. Cardiac Enzymes: No results for input(s): CKTOTAL, CKMB, CKMBINDEX, TROPONINI in the last 168 hours. BNP (last 3 results) No results for input(s): PROBNP in the last 8760 hours. HbA1C: No results for input(s): HGBA1C in the last 72 hours. CBG: No results for input(s): GLUCAP in the last 168 hours. Lipid Profile: No results for input(s): CHOL, HDL, LDLCALC, TRIG, CHOLHDL, LDLDIRECT in the last 72 hours. Thyroid Function Tests: No results for input(s): TSH, T4TOTAL, FREET4, T3FREE, THYROIDAB in the last 72 hours. Anemia Panel: No results for input(s): VITAMINB12, FOLATE, FERRITIN, TIBC, IRON, RETICCTPCT in the last 72 hours. Urine analysis:    Component Value Date/Time   COLORURINE YELLOW 10/20/2015 1810   APPEARANCEUR CLOUDY (A) 10/20/2015 1810   LABSPEC 1.029 10/20/2015 1810   PHURINE 5.5 10/20/2015 1810   GLUCOSEU NEGATIVE 10/20/2015 1810   HGBUR SMALL (A) 10/20/2015 1810   BILIRUBINUR NEGATIVE 10/20/2015 1810   KETONESUR 40 (A) 10/20/2015 1810   PROTEINUR 30 (A) 10/20/2015 1810   UROBILINOGEN 0.2 07/31/2015 1132   NITRITE NEGATIVE 10/20/2015 1810   LEUKOCYTESUR NEGATIVE 10/20/2015 1810   Sepsis Labs: Invalid input(s): PROCALCITONIN, LACTICIDVEN  No results found for this or any previous visit (from the past 240 hour(s)).    Radiology Studies: Ct Angio Chest Pe W/cm &/or Wo Cm  Result Date: 05/12/2017 CLINICAL DATA:  DVT with tachycardia.  Left leg pain. EXAM: CT ANGIOGRAPHY CHEST CT ABDOMEN AND PELVIS WITH CONTRAST TECHNIQUE: Multidetector CT imaging of the chest was performed using the standard protocol during bolus administration of intravenous contrast. Multiplanar CT image reconstructions and MIPs were obtained to evaluate the vascular anatomy. Multidetector CT imaging of the abdomen and pelvis was performed using the standard protocol during bolus administration of intravenous contrast. CONTRAST:  100 cc Isovue 370 intravenous COMPARISON:  None. FINDINGS: CTA  CHEST FINDINGS Cardiovascular: Satisfactory opacification of the pulmonary arteries to the segmental level. No evidence of pulmonary embolism. Normal heart size. No pericardial effusion. Atherosclerosis, including throughout the coronaries. Mediastinum/Nodes: Thyroid nodules measuring up to 17 mm on the left, considered incidental in this setting. Negative for adenopathy. Lungs/Pleura: There is no edema, consolidation, effusion, or pneumothorax. Musculoskeletal: Degenerative changes without acute or aggressive finding Review of the MIP images confirms the above findings. CT ABDOMEN and PELVIS FINDINGS Hepatobiliary: No focal liver abnormality.Gallbladder calculi. Cannot exclude wall calcification towards the fundus. No inflammation or aggressive findings. Pancreas: Unremarkable. Spleen: Unremarkable. Adrenals/Urinary Tract: Negative adrenals. No hydronephrosis or stone. Simple appearing bilateral renal cysts. Unremarkable bladder. Stomach/Bowel: No obstruction. Tortuous colon with moderate stool volume. Stool distends the rectum and sigmoid. No inflammatory changes. Vascular/Lymphatic: Asymmetric non enhancement of  the left iliac and common femoral veins with expansion and indistinctness around the left common femoral vein. Aortic atherosclerosis. No mass or adenopathy. Reproductive:No pathologic findings. Other: No ascites or pneumoperitoneum. Musculoskeletal: No acute finding. Left femoral neck fracture repair. Severe right hip osteoarthritis with bone-on-bone contact. Lumbar spine degeneration with L4 laminectomy Review of the MIP images confirms the above findings. IMPRESSION: Chest CTA: No evidence of pulmonary embolism. Abdominal CT: 1. Patient's known DVT extends to the left common iliac/IVC confluence. 2. No other acute finding. 3. Moderate stool volume, distending the rectum and sigmoid. 4. Cholelithiasis. Superimposed gallbladder wall calcification could be present. Electronically Signed   By: Marnee Spring M.D.   On: 05/12/2017 15:09   Ct Abdomen Pelvis W Contrast  Result Date: 05/12/2017 CLINICAL DATA:  DVT with tachycardia.  Left leg pain. EXAM: CT ANGIOGRAPHY CHEST CT ABDOMEN AND PELVIS WITH CONTRAST TECHNIQUE: Multidetector CT imaging of the chest was performed using the standard protocol during bolus administration of intravenous contrast. Multiplanar CT image reconstructions and MIPs were obtained to evaluate the vascular anatomy. Multidetector CT imaging of the abdomen and pelvis was performed using the standard protocol during bolus administration of intravenous contrast. CONTRAST:  100 cc Isovue 370 intravenous COMPARISON:  None. FINDINGS: CTA CHEST FINDINGS Cardiovascular: Satisfactory opacification of the pulmonary arteries to the segmental level. No evidence of pulmonary embolism. Normal heart size. No pericardial effusion. Atherosclerosis, including throughout the coronaries. Mediastinum/Nodes: Thyroid nodules measuring up to 17 mm on the left, considered incidental in this setting. Negative for adenopathy. Lungs/Pleura: There is no edema, consolidation, effusion, or pneumothorax. Musculoskeletal: Degenerative changes without acute or aggressive finding Review of the MIP images confirms the above findings. CT ABDOMEN and PELVIS FINDINGS Hepatobiliary: No focal liver abnormality.Gallbladder calculi. Cannot exclude wall calcification towards the fundus. No inflammation or aggressive findings. Pancreas: Unremarkable. Spleen: Unremarkable. Adrenals/Urinary Tract: Negative adrenals. No hydronephrosis or stone. Simple appearing bilateral renal cysts. Unremarkable bladder. Stomach/Bowel: No obstruction. Tortuous colon with moderate stool volume. Stool distends the rectum and sigmoid. No inflammatory changes. Vascular/Lymphatic: Asymmetric non enhancement of the left iliac and common femoral veins with expansion and indistinctness around the left common femoral vein. Aortic atherosclerosis. No mass or  adenopathy. Reproductive:No pathologic findings. Other: No ascites or pneumoperitoneum. Musculoskeletal: No acute finding. Left femoral neck fracture repair. Severe right hip osteoarthritis with bone-on-bone contact. Lumbar spine degeneration with L4 laminectomy Review of the MIP images confirms the above findings. IMPRESSION: Chest CTA: No evidence of pulmonary embolism. Abdominal CT: 1. Patient's known DVT extends to the left common iliac/IVC confluence. 2. No other acute finding. 3. Moderate stool volume, distending the rectum and sigmoid. 4. Cholelithiasis. Superimposed gallbladder wall calcification could be present. Electronically Signed   By: Marnee Spring M.D.   On: 05/12/2017 15:09     Scheduled Meds: . B-complex with vitamin C  1 tablet Oral Daily  . calcium-vitamin D  1 tablet Oral Q breakfast  . feeding supplement (ENSURE ENLIVE)  237 mL Oral BID BM  . folic acid  1 mg Oral Daily  . multivitamin with minerals  1 tablet Oral Daily  . rivaroxaban  15 mg Oral BID WC   Followed by  . [START ON 06/03/2017] rivaroxaban  20 mg Oral Q supper  . vitamin B-12  100 mcg Oral Daily  . ascorbic acid  500 mg Oral Daily   Continuous Infusions:   Time spent: 25 minutes, more than 50% at bedside and on the floor discussing with family  Pamella Pertostin Gherghe, MD, PhD [facility] Pager [phone]  If 7PM-7AM, please contact night-coverage [email or web address] Password [email or web address] 05/13/2017, 2:17 PM

## 2017-05-13 NOTE — Progress Notes (Signed)
ANTICOAGULATION CONSULT NOTE - Follow Up Consult  Pharmacy Consult for heparin Indication: DVT  Labs:  Recent Labs  05/12/17 1139 05/13/17 0214  HGB 13.0 11.7*  HCT 38.9* 35.7*  PLT 155 151  HEPARINUNFRC  --  0.69  CREATININE 1.09 0.92    Assessment/Plan:  81yo male therapeutic on heparin with initial dosing for DVT. Will continue gtt at current rate and confirm stable with additional level.   Vernard GamblesVeronda Lavonte Palos, PharmD, BCPS  05/13/2017,3:12 AM

## 2017-05-13 NOTE — Progress Notes (Signed)
  Progress Note    05/13/2017 11:58 AM * No surgery found *  Subjective:  Left leg feeling better  Vitals:   05/12/17 1858 05/13/17 0510  BP: 123/81 (!) 107/48  Pulse: (!) 102 75  Resp: 18 18  Temp: 98.4 F (36.9 C) 98.4 F (36.9 C)    Physical Exam: aaox3 Abdomen is soft lle edema improved, left foot has pitting edema rle without edema Both feet are warm  CBC    Component Value Date/Time   WBC 7.3 05/13/2017 0214   RBC 3.64 (L) 05/13/2017 0214   HGB 11.7 (L) 05/13/2017 0214   HCT 35.7 (L) 05/13/2017 0214   PLT 151 05/13/2017 0214   MCV 98.1 05/13/2017 0214   MCH 32.1 05/13/2017 0214   MCHC 32.8 05/13/2017 0214   RDW 12.9 05/13/2017 0214   LYMPHSABS 1.6 05/12/2017 1139   MONOABS 0.5 05/12/2017 1139   EOSABS 0.1 05/12/2017 1139   BASOSABS 0.0 05/12/2017 1139    BMET    Component Value Date/Time   NA 139 05/13/2017 0214   K 4.0 05/13/2017 0214   CL 109 05/13/2017 0214   CO2 22 05/13/2017 0214   GLUCOSE 77 05/13/2017 0214   BUN 10 05/13/2017 0214   CREATININE 0.92 05/13/2017 0214   CALCIUM 8.2 (L) 05/13/2017 0214   GFRNONAA >60 05/13/2017 0214   GFRAA >60 05/13/2017 0214    INR    Component Value Date/Time   INR 1.24 08/01/2015 1156     Intake/Output Summary (Last 24 hours) at 05/13/17 1158 Last data filed at 05/13/17 [id number]  Gross per 24 hour  Intake           812.67 ml  Output              400 ml  Net           412.67 ml     Assessment:  81 y.o. male is admitted with extensive lle dvt and what appears by ct to have may-Thurner, he does not ambulate much  Plan: Given low functional status benefits of intervention do not outweigh risks of bleeding, injury to blood vessels, iatrogenic pe, renal failure, etc. Po anticoagulation for at least 3 months with consideration of longer given ongoing obstructive component. No indication for ivc filter if he tolerate anticoagulation. Can f/u with me on prn basis if he has symptoms of post-thrombotic  syndrome (swelling, heaviness, pain, skin changes)   Tyeshia Cornforth C. Randie Heinzain, MD [facility] Office: [phone] Pager: [phone]  05/13/2017 11:58 AM

## 2017-05-13 NOTE — Progress Notes (Signed)
Initial Nutrition Assessment  DOCUMENTATION CODES:   Severe malnutrition in context of chronic illness  INTERVENTION:   Ensure Enlive po BID, each supplement provides 350 kcal and 20 grams of protein  MVI  NUTRITION DIAGNOSIS:   Malnutrition (severe) related to chronic illness (advanced age, chronic poor appetite) as evidenced by severe depletion of muscle mass, severe depletion of body fat.  GOAL:   Patient will meet greater than or equal to 90% of their needs  MONITOR:   PO intake, Supplement acceptance, Labs, Weight trends  REASON FOR ASSESSMENT:   Malnutrition Screening Tool    ASSESSMENT:   81 y.o. male with a PMH of spinal stenosis of L3-L4 and L4-L5 who underwent a decompression and microdiscectomy 07/2015 but who has had limited mobility since that time who presents with a 7-10 day history of worsening left lower extremity swelling. Pt found to have DVT   Met with pt and pt's wife in room today. Pt reports chronic poor appetite for several months. Pt reports that he weighed 175lbs one year ago; per chart, pt weighed 129lbs in 07/2015 so unsure if pt has actually had any significant wt loss. There is no recent wt history in chart. Pt currently eating <25% of meals. Pt does not eat pork or shrimp for religous regions. Pt does like Ensure. Pt uses a walker at baseline; he reports that he has weak legs. RD discussed with pt the importance of adequate protein intake to preserve lean muscle mass and strengthen his legs.    Medications reviewed and include: B-complex, Ca-Vit D, folic acid, MVI, Vit J-00, Vit C  Labs reviewed: Ca 8.2(L)  Nutrition-Focused physical exam completed. Findings are severe fat depletion in arms and chest, severe muscle depletion over entire body, and moderate edema in left foot.   Diet Order:  Diet regular Room service appropriate? Yes; Fluid consistency: Thin  Skin:  Reviewed, no issues  Last BM:  7/12  Height:   Ht Readings from Last 1  Encounters:  05/12/17 5' 8" (1.727 m)    Weight:   Wt Readings from Last 1 Encounters:  05/13/17 136 lb (61.7 kg)    Ideal Body Weight:  70 kg  BMI:  Body mass index is 20.68 kg/m.  Estimated Nutritional Needs:   Kcal:  1900-2200kcal/day   Protein:  99-111g/day   Fluid:  >1.9L/day   EDUCATION NEEDS:   Education needs addressed  Koleen Distance MS, RD, LDN Pager #[phone] After Hours Pager: [phone]

## 2017-05-13 NOTE — Care Management Note (Addendum)
Case Management Note  Patient Details  Name: Derek PoleJulius G Ta MRN: [id number] Date of Birth: 12/24/1935  Subjective/Objective:                 Verbal referral received from Dr Elvera LennoxGherghe for [name]. Benefit check could not be completed on the weekend. Patient and wife provided with 30 day card. Verbalized understanding for use. THIS WILL NEED TO BE REINFORCED AT TIME OF DISCHARGE. They could not name their PCP, and state they have had difficulties getting an appointment, no notes in EPIC from previous PCP appointments. [facility] contact info placed on AVS with instructions to make appointment on Monday if DC'd over the weekend. Instructed multiple times to call and get appointment so they had a physician to assist with [name] for when the 30 day sample ran out. Emphasized to wife multiple times that she needs this appointment for next week. [facility] Dr Elvera LennoxGherghe for medication assistance/ disease management. [facility]    05/14/17 Spoke with patient and wife again. Wife very anxious and overwhelmed could not find [place] card given yesterday, provided with another one. Patient verbalized and repeated back how to take medication, to get it filled today and to give card to pharmacist. Per RN they will be provided with PM dose to take home for tonight. [facility].   Action/Plan:  This patient needs CM to [facility] if not DC'd over the weekend. Please review Xaralto card at time of DC.   Expected Discharge Date:                  Expected Discharge Plan:  Home w Home Health Services  In-House Referral:     Discharge planning Services  CM Consult, Medication Assistance  Post Acute Care Choice:  Home Health Choice offered to:  Spouse, Patient  DME Arranged:    DME Agency:     HH Arranged:  RN PT [facility] HH Agency:  [facility]  Status of Service:  In process, will continue to follow  If discussed at Long  Length of Stay Meetings, dates discussed:    Additional Comments:  Lawerance SabalDebbie Emmry Hinsch, RN 05/13/2017, 10:07 AM

## 2017-05-13 NOTE — Progress Notes (Signed)
ANTICOAGULATION CONSULT NOTE - Follow Up Consult  Pharmacy Consult for Rivaroxaban  Indication: DVT  Patient Measurements: Height: 5\' 8"  (172.7 cm) Weight: 136 lb (61.7 kg) IBW/kg (Calculated) : 68.4  Vital Signs: Temp: 98.4 F (36.9 C) (07/14 0510) Temp Source: Oral (07/14 0510) BP: 107/48 (07/14 0510) Pulse Rate: 75 (07/14 0510)  Labs:  Recent Labs  05/12/17 1139 05/13/17 0214  HGB 13.0 11.7*  HCT 38.9* 35.7*  PLT 155 151  HEPARINUNFRC  --  0.69  CREATININE 1.09 0.92    Estimated Creatinine Clearance: 57.8 mL/min (by C-G formula based on SCr of 0.92 mg/dL).   Medications:  Infusions:  . sodium chloride 100 mL/hr at 05/13/17 0732  . heparin 1,000 Units/hr (05/12/17 1724)    Assessment:80 yoM with new DVT of LLE. CBC good. Patient has been on heparin  Drip since admission, now consulted to transition to rivaroxaban. Patient denies hx of DVT/ PE, no anticoagulation prior to admission. Patient denies recent surgery, prolonged immobilization, cough, SOB, chest pain, hemoptysis, fever or chills. Patient leads sedentary lifestyle and could contribute to development of DVT. Per MD note, patient will require at least 3 months of anticoagulation therapy and possibly longer.    Goal of Therapy:  No lab monitoring needed for rivaroxaban  Monitor platelets by anticoagulation protocol: Yes   Plan:  Discontinue heparin drip  Rivaroxaban treatment dose for new DVT Start Rivaroxaban 15mg  PO BID with food x 21 days, then 20mg  daily Monitor for S/Sx of bleeding, CBC, renal function  No routine coagulation lab testing indicated  Pharmacy to educate patient on new start of anticoagulation medication  Blake DivineShannon Ruari Duggan, Pharm.D. PGY1 Pharmacy Resident 05/13/2017 10:00 AM Main Pharmacy: [phone] Pager: [phone]

## 2017-05-14 DIAGNOSIS — E43 Unspecified severe protein-calorie malnutrition: Secondary | ICD-10-CM

## 2017-05-14 LAB — CBC
HCT: 33.7 % — ABNORMAL LOW (ref 39.0–52.0)
HEMOGLOBIN: 11.2 g/dL — AB (ref 13.0–17.0)
MCH: 32.4 pg (ref 26.0–34.0)
MCHC: 33.2 g/dL (ref 30.0–36.0)
MCV: 97.4 fL (ref 78.0–100.0)
Platelets: 163 10*3/uL (ref 150–400)
RBC: 3.46 MIL/uL — AB (ref 4.22–5.81)
RDW: 12.9 % (ref 11.5–15.5)
WBC: 6.6 10*3/uL (ref 4.0–10.5)

## 2017-05-14 MED ORDER — SODIUM CHLORIDE 0.9 % IV BOLUS (SEPSIS)
500.0000 mL | Freq: Once | INTRAVENOUS | Status: AC
  Administered 2017-05-14: 500 mL via INTRAVENOUS

## 2017-05-14 MED ORDER — RIVAROXABAN (XARELTO) VTE STARTER PACK (15 & 20 MG): ORAL_TABLET | ORAL | 0 refills | Status: DC

## 2017-05-14 NOTE — Progress Notes (Addendum)
Clarice PoleJulius G Kelm to be D/C'd Home per MD order.  Discussed with the patient and all questions fully answered.  VSS, Skin clean, dry and intact without evidence of skin break down, no evidence of skin tears noted. IV catheter discontinued intact. Site without signs and symptoms of complications. Dressing and pressure applied.  An After Visit Summary was printed and given to the patient. Patient received prescription. Pt received Zarelto dose for tonight per MD  D/c education completed with patient/family including follow up instructions, medication list, d/c activities limitations if indicated, with other d/c instructions as indicated by MD - patient able to verbalize understanding, all questions fully answered.   Patient instructed to return to ED, call 911, or call MD for any changes in condition.   Patient escorted via WC, and D/C home via private auto.  Rock NephewLisbeth  Calyssa Zobrist 05/14/2017 4:12 PM

## 2017-05-14 NOTE — Discharge Summary (Signed)
Physician Discharge Summary  Derek Bentley NFA:[id number] DOB: Nov 01, 1935 DOA: 05/12/2017  PCP: Patient, No Pcp Per  Admit date: 05/12/2017 Discharge date: 05/14/2017  Admitted From: home Disposition:  home  Recommendations for Outpatient Follow-up:  Follow up with PCP in 1-2 weeks  Home Health: none Equipment/Devices: none  Discharge Condition: stable CODE STATUS: Full code Diet recommendation: regular  HPI: per Dr. Darnelle Catalan, Derek Bentley is an 81 y.o. male with a PMH of spinal stenosis of L3-L4 and L4-L5 who underwent a decompression and microdiscectomy 07/2015 but who has had limited mobility since that time who presents with a 7-10 day history of worsening left lower extremity swelling. The patient denies significant pain on the left, although he does have chronic pain in his right leg and hip. He denies any recent travel. He is able to ambulate household distances with a 4 wheeled walker. He does not have any history of blood clotting problems. ED Course:  The patient had a Doppler of his left leg which showed extensive DVT extending up to the left iliac, common femoral and profunda as well as popliteal and calf veins. Vascular surgery was consulted due to the extensiveness of his blood clot.  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  Vascular surgery was consulted, and initially evaluated patient for thrombolysis, however after discussions, patient refused this procedure.  She was started on heparin drip for 24 hours, he remained stable, no bleeding, various anticoagulants have been discussed with the patient and per his preference will  start Xarelto.  Care management was consulted to help with medication assistance.  He remained stable, tolerated oral anticoagulation, his left lower extremity swelling started to improve, and was discharged home in stable condition.  He will establish care with [facility] the next workday as he currently does not have a PCP.  Appreciate care management assistance.  Discharge Instructions   Allergies as of 05/14/2017      Reactions   Tramadol Other (See Comments)   Hiccups and constipation      Medication List    STOP taking these medications   MAGNESIUM PO     TAKE these medications   acetaminophen 500 MG tablet Commonly known as:  TYLENOL Take 500 mg by mouth every 6 (six) hours as needed for mild pain.   ascorbic acid 500 MG tablet Commonly known as:  VITAMIN C Take 500 mg by mouth daily.   b complex vitamins tablet Take 1 tablet by mouth daily.   bisacodyl 5 MG EC tablet Commonly known as:  DULCOLAX Take 5 mg by mouth daily as needed for moderate constipation.   bisacodyl 10 MG suppository Commonly known as:  DULCOLAX Place 1 suppository (10 mg total) rectally daily as needed for moderate constipation.   calcium-vitamin D 500-200 MG-UNIT tablet Commonly known as:  OSCAL WITH D Take 1 tablet by mouth daily with breakfast.   docusate sodium 100 MG capsule Commonly known as:  COLACE Take 1 capsule (100 mg total) by mouth 2 (two) times daily as needed for mild constipation.   FOLIC ACID PO Take 1 tablet by mouth daily.   multivitamin with  minerals Tabs tablet Take 1 tablet by mouth daily.   Rivaroxaban 15 & 20 MG Tbpk Take as directed on package: Start with one 15mg  tablet by mouth twice a day with food. On Day 22, switch to one 20mg  tablet once a day with food.   senna 8.6 MG Tabs tablet Commonly known as:  SENOKOT Take 1 tablet (8.6 mg total) by mouth daily as needed for mild constipation.   tamsulosin 0.4 MG Caps  capsule Commonly known as:  FLOMAX Take 1 capsule (0.4 mg total) by mouth daily after supper.   TRIAMCINOLONE ACETONIDE EX Apply 1 application topically daily as needed (rash).   VITAMIN B 12 PO Take 1 tablet by mouth daily.      Follow-up Information    Center, [facility]. Schedule an appointment as soon as possible for a visit.   Specialty:  General Practice Why:  You must call on Monday morning to make appointment ASAP with a doctor for help getting your Xaralto (blood thinner).  Contact information: [address] [place] [address] [phone]        [facility]. Schedule an appointment as soon as possible for a visit in 1 week(s).   Contact information: [address] [place] [phone]         Allergies  Allergen Reactions  . Tramadol Other (See Comments)    Hiccups and constipation    Consultations:  Vascular surgery  Procedures/Studies:  Ct Angio Chest Pe W/cm &/or Wo Cm  Result Date: 05/12/2017 CLINICAL DATA:  DVT with tachycardia.  Left leg pain. EXAM: CT ANGIOGRAPHY CHEST CT ABDOMEN AND PELVIS WITH CONTRAST TECHNIQUE: Multidetector CT imaging of the chest was performed using the standard protocol during bolus administration of intravenous contrast. Multiplanar CT image reconstructions and MIPs were obtained to evaluate the vascular anatomy. Multidetector CT imaging of the abdomen and pelvis was performed using the standard protocol during bolus administration of intravenous contrast. CONTRAST:  100 cc Isovue 370 intravenous COMPARISON:  None. FINDINGS: CTA CHEST FINDINGS Cardiovascular: Satisfactory opacification of the pulmonary arteries to the segmental level. No evidence of pulmonary embolism. Normal heart size. No pericardial effusion. Atherosclerosis, including throughout the coronaries. Mediastinum/Nodes: Thyroid nodules measuring up to 17 mm on the  left, considered incidental in this setting. Negative for adenopathy. Lungs/Pleura: There is no edema, consolidation, effusion, or pneumothorax. Musculoskeletal: Degenerative changes without acute or aggressive finding Review of the MIP images confirms the above findings. CT ABDOMEN and PELVIS FINDINGS Hepatobiliary: No focal liver abnormality.Gallbladder calculi. Cannot exclude wall calcification towards the fundus. No inflammation or aggressive findings. Pancreas: Unremarkable. Spleen: Unremarkable. Adrenals/Urinary Tract: Negative adrenals. No hydronephrosis or stone. Simple appearing bilateral renal cysts. Unremarkable bladder. Stomach/Bowel: No obstruction. Tortuous colon with moderate stool volume. Stool distends the rectum and sigmoid. No inflammatory changes. Vascular/Lymphatic: Asymmetric non enhancement of the left iliac and common femoral veins with expansion and indistinctness around the left common femoral vein. Aortic atherosclerosis. No mass or adenopathy. Reproductive:No pathologic findings. Other: No ascites or pneumoperitoneum. Musculoskeletal: No acute finding. Left femoral neck fracture repair. Severe right hip osteoarthritis with bone-on-bone contact. Lumbar spine degeneration with L4 laminectomy Review of the MIP images confirms the above findings. IMPRESSION: Chest CTA: No evidence of pulmonary embolism. Abdominal CT: 1. Patient's known DVT extends to the left common iliac/IVC confluence. 2. No other acute finding. 3. Moderate stool volume, distending the rectum and sigmoid. 4. Cholelithiasis. Superimposed gallbladder wall calcification could  be present. Electronically Signed   By: Marnee Spring M.D.   On: 05/12/2017 15:09   Ct Abdomen Pelvis W Contrast  Result Date: 05/12/2017 CLINICAL DATA:  DVT with tachycardia.  Left leg pain. EXAM: CT ANGIOGRAPHY CHEST CT ABDOMEN AND PELVIS WITH CONTRAST TECHNIQUE: Multidetector CT imaging of the chest was performed using the standard protocol  during bolus administration of intravenous contrast. Multiplanar CT image reconstructions and MIPs were obtained to evaluate the vascular anatomy. Multidetector CT imaging of the abdomen and pelvis was performed using the standard protocol during bolus administration of intravenous contrast. CONTRAST:  100 cc Isovue 370 intravenous COMPARISON:  None. FINDINGS: CTA CHEST FINDINGS Cardiovascular: Satisfactory opacification of the pulmonary arteries to the segmental level. No evidence of pulmonary embolism. Normal heart size. No pericardial effusion. Atherosclerosis, including throughout the coronaries. Mediastinum/Nodes: Thyroid nodules measuring up to 17 mm on the left, considered incidental in this setting. Negative for adenopathy. Lungs/Pleura: There is no edema, consolidation, effusion, or pneumothorax. Musculoskeletal: Degenerative changes without acute or aggressive finding Review of the MIP images confirms the above findings. CT ABDOMEN and PELVIS FINDINGS Hepatobiliary: No focal liver abnormality.Gallbladder calculi. Cannot exclude wall calcification towards the fundus. No inflammation or aggressive findings. Pancreas: Unremarkable. Spleen: Unremarkable. Adrenals/Urinary Tract: Negative adrenals. No hydronephrosis or stone. Simple appearing bilateral renal cysts. Unremarkable bladder. Stomach/Bowel: No obstruction. Tortuous colon with moderate stool volume. Stool distends the rectum and sigmoid. No inflammatory changes. Vascular/Lymphatic: Asymmetric non enhancement of the left iliac and common femoral veins with expansion and indistinctness around the left common femoral vein. Aortic atherosclerosis. No mass or adenopathy. Reproductive:No pathologic findings. Other: No ascites or pneumoperitoneum. Musculoskeletal: No acute finding. Left femoral neck fracture repair. Severe right hip osteoarthritis with bone-on-bone contact. Lumbar spine degeneration with L4 laminectomy Review of the MIP images confirms the  above findings. IMPRESSION: Chest CTA: No evidence of pulmonary embolism. Abdominal CT: 1. Patient's known DVT extends to the left common iliac/IVC confluence. 2. No other acute finding. 3. Moderate stool volume, distending the rectum and sigmoid. 4. Cholelithiasis. Superimposed gallbladder wall calcification could be present. Electronically Signed   By: Marnee Spring M.D.   On: 05/12/2017 15:09     Subjective: - no chest pain, shortness of breath, no abdominal pain, nausea or vomiting.   Discharge Exam: Vitals:   05/14/17 0310 05/14/17 0650  BP: (!) 93/44 107/60  Pulse: 70 73  Resp:  17  Temp:  98.3 F (36.8 C)   Vitals:   05/13/17 2309 05/14/17 0017 05/14/17 0310 05/14/17 0650  BP: (!) 88/48 (!) 96/46 (!) 93/44 107/60  Pulse:  69 70 73  Resp:    17  Temp:    98.3 F (36.8 C)  TempSrc:      SpO2:    97%  Weight:      Height:        General: Pt is alert, awake, not in acute distress Cardiovascular: RRR, S1/S2 +, no rubs, no gallops Respiratory: CTA bilaterally, no wheezing, no rhonchi Abdominal: Soft, NT, ND, bowel sounds + Extremities: no edema, no cyanosis    The results of significant diagnostics from this hospitalization (including imaging, microbiology, ancillary and laboratory) are listed below for reference.     Microbiology: No results found for this or any previous visit (from the past 240 hour(s)).   Labs: BNP (last 3 results) No results for input(s): BNP in the last 8760 hours. Basic Metabolic Panel:  Recent Labs Lab 05/12/17 1139 05/13/17 0214  NA 140 139  K 4.0 4.0  CL 106 109  CO2 23 22  GLUCOSE 85 77  BUN 10 10  CREATININE 1.09 0.92  CALCIUM 9.1 8.2*   Liver Function Tests: No results for input(s): AST, ALT, ALKPHOS, BILITOT, PROT, ALBUMIN in the last 168 hours. No results for input(s): LIPASE, AMYLASE in the last 168 hours. No results for input(s): AMMONIA in the last 168 hours. CBC:  Recent Labs Lab 05/12/17 1139 05/13/17 0214  05/14/17 0428  WBC 6.3 7.3 6.6  NEUTROABS 4.1  --   --   HGB 13.0 11.7* 11.2*  HCT 38.9* 35.7* 33.7*  MCV 97.5 98.1 97.4  PLT 155 151 163   Cardiac Enzymes: No results for input(s): CKTOTAL, CKMB, CKMBINDEX, TROPONINI in the last 168 hours. BNP: Invalid input(s): POCBNP CBG: No results for input(s): GLUCAP in the last 168 hours. D-Dimer No results for input(s): DDIMER in the last 72 hours. Hgb A1c No results for input(s): HGBA1C in the last 72 hours. Lipid Profile No results for input(s): CHOL, HDL, LDLCALC, TRIG, CHOLHDL, LDLDIRECT in the last 72 hours. Thyroid function studies No results for input(s): TSH, T4TOTAL, T3FREE, THYROIDAB in the last 72 hours.  Invalid input(s): FREET3 Anemia work up No results for input(s): VITAMINB12, FOLATE, FERRITIN, TIBC, IRON, RETICCTPCT in the last 72 hours. Urinalysis    Component Value Date/Time   COLORURINE YELLOW 10/20/2015 1810   APPEARANCEUR CLOUDY (A) 10/20/2015 1810   LABSPEC 1.029 10/20/2015 1810   PHURINE 5.5 10/20/2015 1810   GLUCOSEU NEGATIVE 10/20/2015 1810   HGBUR SMALL (A) 10/20/2015 1810   BILIRUBINUR NEGATIVE 10/20/2015 1810   KETONESUR 40 (A) 10/20/2015 1810   PROTEINUR 30 (A) 10/20/2015 1810   UROBILINOGEN 0.2 07/31/2015 1132   NITRITE NEGATIVE 10/20/2015 1810   LEUKOCYTESUR NEGATIVE 10/20/2015 1810   Sepsis Labs Invalid input(s): PROCALCITONIN,  WBC,  LACTICIDVEN Microbiology No results found for this or any previous visit (from the past 240 hour(s)).   Time coordinating discharge: 35 minutes  SIGNED:  Pamella Pert, MD  [facility] 05/14/2017, 3:26 PM Pager [phone]  If 7PM-7AM, please contact night-coverage [email or web address] Password [email or web address]

## 2017-05-23 ENCOUNTER — Ambulatory Visit (INDEPENDENT_AMBULATORY_CARE_PROVIDER_SITE_OTHER): Payer: Medicare Other | Admitting: Physician Assistant

## 2017-05-23 ENCOUNTER — Encounter (INDEPENDENT_AMBULATORY_CARE_PROVIDER_SITE_OTHER): Payer: Self-pay | Admitting: Physician Assistant

## 2017-05-23 VITALS — BP 110/69 | HR 81 | Temp 97.5°F

## 2017-05-23 DIAGNOSIS — I871 Compression of vein: Secondary | ICD-10-CM | POA: Diagnosis not present

## 2017-05-23 DIAGNOSIS — I82492 Acute embolism and thrombosis of other specified deep vein of left lower extremity: Secondary | ICD-10-CM | POA: Diagnosis not present

## 2017-05-23 NOTE — Patient Instructions (Signed)
Deep Vein Thrombosis A deep vein thrombosis (DVT) is a blood clot (thrombus) that usually occurs in a deep, larger vein of the lower leg or the pelvis, or in an upper extremity such as the arm. These are dangerous and can lead to serious and even life-threatening complications if the clot travels to the lungs. A DVT can damage the valves in your leg veins so that instead of flowing upward, the blood pools in the lower leg. This is called post-thrombotic syndrome, and it can result in pain, swelling, discoloration, and sores on the leg. What are the causes? A DVT is caused by the formation of a blood clot in your leg, pelvis, or arm. Usually, several things contribute to the formation of blood clots. A clot may develop when:  Your blood flow slows down.  Your vein becomes damaged in some way.  You have a condition that makes your blood clot more easily.  What increases the risk? A DVT is more likely to develop in:  People who are older, especially over 60 years of age.  People who are overweight (obese).  People who sit or lie still for a long time, such as during long-distance travel (over 4 hours), bed rest, hospitalization, or during recovery from certain medical conditions like a stroke.  People who do not engage in much physical activity (sedentary lifestyle).  People who have chronic breathing disorders.  People who have a personal or family history of blood clots or blood clotting disease.  People who have peripheral vascular disease (PVD), diabetes, or some types of cancer.  People who have heart disease, especially if the person had a recent heart attack or has congestive heart failure.  People who have neurological diseases that affect the legs (leg paresis).  People who have had a traumatic injury, such as breaking a hip or leg.  People who have recently had major or lengthy surgery, especially on the hip, knee, or abdomen.  People who have had a central line placed  inside a large vein.  People who take medicines that contain the hormone estrogen. These include birth control pills and hormone replacement therapy.  Pregnancy or during childbirth or the postpartum period.  Long plane flights (over 8 hours).  What are the signs or symptoms?  Symptoms of a DVT can include:  Swelling of your leg or arm, especially if one side is much worse.  Warmth and redness of your leg or arm, especially if one side is much worse.  Pain in your arm or leg. If the clot is in your leg, symptoms may be more noticeable or worse when you stand or walk.  A feeling of pins and needles, if the clot is in the arm.  The symptoms of a DVT that has traveled to the lungs (pulmonary embolism, PE) usually start suddenly and include:  Shortness of breath while active or at rest.  Coughing or coughing up blood or blood-tinged mucus.  Chest pain that is often worse with deep breaths.  Rapid or irregular heartbeat.  Feeling light-headed or dizzy.  Fainting.  Feeling anxious.  Sweating.  There may also be pain and swelling in a leg if that is where the blood clot started. These symptoms may represent a serious problem that is an emergency. Do not wait to see if the symptoms will go away. Get medical help right away. Call your local emergency services (911 in the [place] Do not drive yourself to the hospital. How is this diagnosed? Your health   care provider will take a medical history and perform a physical exam. You may also have other tests, including:  Blood tests to assess the clotting properties of your blood.  Imaging tests, such as CT, ultrasound, MRI, X-ray, and other tests to see if you have clots anywhere in your body.  How is this treated? After a DVT is identified, it can be treated. The type of treatment that you receive depends on many factors, such as the cause of your DVT, your risk for bleeding or developing more clots, and other medical conditions that  you have. Sometimes, a combination of treatments is necessary. Treatment options may be combined and include:  Monitoring the blood clot with ultrasound.  Taking medicines by mouth, such as newer blood thinners (anticoagulants), thrombolytics, or warfarin.  Taking anticoagulant medicine by injection or through an IV tube.  Wearing compression stockings or using different types ofdevices.  Surgery (rare) to remove the blood clot or to place a filter in your abdomen to stop the blood clot from traveling to your lungs.  Treatments for a DVT are often divided into immediate treatment and long-term treatment (up to 3 months after DVT). You can work with your health care provider to choose the treatment program that is best for you. Follow these instructions at home: If you are taking a newer oral anticoagulant:  Take the medicine every single day at the same time each day.  Understand what foods and drugs interact with this medicine.  Understand that there are no regular blood tests required when using this medicine.  Understand the side effects of this medicine, including excessive bruising or bleeding. Ask your health care provider or pharmacist about other possible side effects. If you are taking warfarin:  Understand how to take warfarin and know which foods can affect how warfarin works in your body.  Understand that it is dangerous to take too much or too little warfarin. Too much warfarin increases the risk of bleeding. Too little warfarin continues to allow the risk for blood clots.  Follow your PT and INR blood testing schedule. The PT and INR results allow your health care provider to adjust your dose of warfarin. It is very important that you have your PT and INR tested as often as told by your health care provider.  Avoid major changes in your diet, or tell your health care provider before you change your diet. Arrange a visit with a registered dietitian to answer your  questions. Many foods, especially foods that are high in vitamin K, can interfere with warfarin and affect the PT and INR results. Eat a consistent amount of foods that are high in vitamin K, such as: ? Spinach, kale, broccoli, cabbage, collard greens, turnip greens, Brussels sprouts, peas, cauliflower, seaweed, and parsley. ? Beef liver and pork liver. ? Green tea. ? Soybean oil.  Tell your health care provider about any and all medicines, vitamins, and supplements that you take, including aspirin and other over-the-counter anti-inflammatory medicines. Be especially cautious with aspirin and anti-inflammatory medicines. Do not take those before you ask your health care provider if it is safe to do so. This is important because many medicines can interfere with warfarin and affect the PT and INR results.  Do not start or stop taking any over-the-counter or prescription medicine unless your health care provider or pharmacist tells you to do so. If you take warfarin, you will also need to do these things:  Hold pressure over cuts for longer than   usual.  Tell your dentist and other health care providers that you are taking warfarin before you have any procedures in which bleeding may occur.  Avoid alcohol or drink very small amounts. Tell your health care provider if you change your alcohol intake.  Do not use tobacco products, including cigarettes, chewing tobacco, and e-cigarettes. If you need help quitting, ask your health care provider.  Avoid contact sports.  General instructions  Take over-the-counter and prescription medicines only as told by your health care provider. Anticoagulant medicines can have side effects, including easy bruising and difficulty stopping bleeding. If you are prescribed an anticoagulant, you will also need to do these things: ? Hold pressure over cuts for longer than usual. ? Tell your dentist and other health care providers that you are taking anticoagulants  before you have any procedures in which bleeding may occur. ? Avoid contact sports.  Wear a medical alert bracelet or carry a medical alert card that says you have had a PE.  Ask your health care provider how soon you can go back to your normal activities. Stay active to prevent new blood clots from forming.  Make sure to exercise while traveling or when you have been sitting or standing for a long period of time. It is very important to exercise. Exercise your legs by walking or by tightening and relaxing your leg muscles often. Take frequent walks.  Wear compression stockings as told by your health care provider to help prevent more blood clots from forming.  Do not use tobacco products, including cigarettes, chewing tobacco, and e-cigarettes. If you need help quitting, ask your health care provider.  Keep all follow-up appointments with your health care provider. This is important. How is this prevented? Take these actions to decrease your risk of developing another DVT:  Exercise regularly. For at least 30 minutes every day, engage in: ? Activity that involves moving your arms and legs. ? Activity that encourages good blood flow through your body by increasing your heart rate.  Exercise your arms and legs every hour during long-distance travel (over 4 hours). Drink plenty of water and avoid drinking alcohol while traveling.  Avoid sitting or lying in bed for long periods of time without moving your legs.  Maintain a weight that is appropriate for your height. Ask your health care provider what weight is healthy for you.  If you are a woman who is over 35 years of age, avoid unnecessary use of medicines that contain estrogen. These include birth control pills.  Do not smoke, especially if you take estrogen medicines. If you need help quitting, ask your health care provider.  If you are hospitalized, prevention measures may include:  Early walking after surgery, as soon as your  health care provider says that it is safe.  Receiving anticoagulants to prevent blood clots.If you cannot take anticoagulants, other options may be available, such as wearing compression stockings or using different types of devices.  Get help right away if:  You have new or increased pain, swelling, or redness in an arm or leg.  You have numbness or tingling in an arm or leg.  You have shortness of breath while active or at rest.  You have chest pain.  You have a rapid or irregular heartbeat.  You feel light-headed or dizzy.  You cough up blood.  You notice blood in your vomit, bowel movement, or urine. These symptoms may represent a serious problem that is an emergency. Do not wait to see   if the symptoms will go away. Get medical help right away. Call your local emergency services (911 in the [place] Do not drive yourself to the hospital. This information is not intended to replace advice given to you by your health care provider. Make sure you discuss any questions you have with your health care provider. Document Released: 10/17/2005 Document Revised: 03/24/2016 Document Reviewed: 02/11/2015 Elsevier Interactive Patient Education  2017 [employer]  

## 2017-05-23 NOTE — Progress Notes (Signed)
Subjective:  Patient ID: Derek Bentley, male    DOB: June 20, 1936  Age: 81 y.o. MRN: [id number]  CC: DVT  HPI ORLIN KANN is a 81 y.o. male with a PMH of BPH, SBO, paroxysmal Afib, diverticulitis, eczema, and spinal stenosis presents as a new pt on hospital f/u of LLE DVT. Was admitted on 05/12/17 to 05/14/17 for extensive DVT. Doppler of his left leg showed extensive DVT extending up to the left iliac, common femoral and profunda as well as popliteal and calf veins.  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 Said he was not told about compression to his iliac vein. Pt currently on starter pack of Xarelto and is on day 9 of 15 mg BID. Has swelling of his entire LLE but there is no pain. Ambulates with help of walker while at home. Denies f/c/n/v, SOB, HA, CP, palpitations, abdominal pain, hematochezia, or hematuria.     Outpatient Medications Prior to Visit  Medication Sig Dispense Refill  . acetaminophen (TYLENOL) 500 MG tablet Take 500 mg by mouth every 6 (six) hours as needed for mild pain.    Marland Kitchen ascorbic acid (VITAMIN C) 500 MG tablet Take 500 mg by mouth daily.    Marland Kitchen b complex vitamins tablet Take 1 tablet by mouth daily.    . bisacodyl (DULCOLAX) 10 MG suppository Place 1 suppository (10 mg total) rectally daily as needed for moderate constipation. (Patient not taking: Reported on 05/12/2017) 12 suppository 0  . bisacodyl (DULCOLAX) 5 MG EC tablet Take 5 mg by mouth daily as needed for moderate constipation.    . calcium-vitamin D (OSCAL WITH D) 500-200 MG-UNIT per tablet Take 1 tablet by mouth daily with breakfast.    . Cyanocobalamin (VITAMIN B 12 PO) Take 1 tablet by mouth daily.    Marland Kitchen docusate sodium (COLACE) 100 MG capsule Take 1 capsule (100 mg total) by mouth 2 (two) times daily as needed for mild constipation. (Patient not taking: Reported on 05/12/2017) 20 capsule 1  . FOLIC ACID PO Take 1 tablet by mouth daily.    . Multiple Vitamin (MULTIVITAMIN WITH  MINERALS) TABS tablet Take 1 tablet by mouth daily.    . Rivaroxaban 15 & 20 MG TBPK Take as directed on package: Start with one 15mg  tablet by mouth twice a day with food. On Day 22, switch to one 20mg  tablet once a day with food. (Patient not taking: Reported on 05/23/2017) 51 each 0  . senna (SENOKOT) 8.6 MG TABS tablet Take 1 tablet (8.6 mg total) by mouth daily as needed for mild constipation. (Patient not taking: Reported on 05/12/2017) 20 each 0  . tamsulosin (FLOMAX) 0.4 MG CAPS capsule Take 1 capsule (0.4 mg total) by mouth daily after supper. (Patient not taking: Reported on 10/18/2015) 30 capsule 1  . TRIAMCINOLONE ACETONIDE EX Apply 1 application topically daily as needed (rash).     No facility-administered medications prior to visit.      ROS Review of Systems  Constitutional: Negative for chills, fever and malaise/fatigue.  Eyes: Negative for blurred vision.  Respiratory: Negative for shortness of breath.   Cardiovascular: Positive for leg swelling. Negative for chest pain and palpitations.  Gastrointestinal: Negative for abdominal pain and nausea.  Genitourinary: Negative for dysuria and hematuria.  Musculoskeletal: Negative for joint pain and myalgias.  Skin: Negative for rash.  Neurological: Negative for tingling and headaches.  Psychiatric/Behavioral: Negative for depression. The patient is not nervous/anxious.  Objective:  BP 110/69 (BP Location: Right Arm, Patient Position: Sitting, Cuff Size: Normal)   Pulse 81   Temp (!) 97.5 F (36.4 C) (Oral)   SpO2 98%   BP/Weight 05/23/2017 05/14/2017 05/13/2017  Systolic BP 110 107 -  Diastolic BP 69 60 -  Wt. (Lbs) - - 136  BMI - - 20.68      Physical Exam  Constitutional: He is oriented to person, place, and time.  frail, NAD, polite, sitting on wheelchair.  HENT:  Head: Normocephalic and atraumatic.  Eyes: No scleral icterus.  Cardiovascular: Normal rate, regular rhythm and normal heart sounds.    Pulmonary/Chest: Effort normal and breath sounds normal.  Musculoskeletal:  LLE with significant swelling of the entire extremity. No erythema, tenderness to palpation, or increased warmth.  Neurological: He is alert and oriented to person, place, and time. No cranial nerve deficit.  Skin: Skin is warm and dry. No rash noted. No erythema. No pallor.  Psychiatric: He has a normal mood and affect. His behavior is normal. Thought content normal.  Vitals reviewed.    Assessment & Plan:   1. May-Thurner syndrome - Ambulatory referral to Vascular Surgery  2. Acute deep vein thrombosis (DVT) of other specified vein of left lower extremity (HCC) - Pt's main concern today is about the affordability of Xarelto. He does not know how he will pay for Xarelto once the starter pack is finished. I printed out a coupon for Xarelto from a web site and asked for patient to take to pharmacy to see if the coupon will be honored. I asked that he call me if the coupon is not honored so I may look further into discount programs on Xarelto. - CBC with Differential - Comprehensive metabolic panel - Compression stockings     Follow-up: Return in about 4 weeks (around 06/20/2017) for DVT f/u.   Loletta Specteroger David Gomez PA

## 2017-05-24 LAB — CBC WITH DIFFERENTIAL/PLATELET
BASOS: 1 %
Basophils Absolute: 0 10*3/uL (ref 0.0–0.2)
EOS (ABSOLUTE): 0.1 10*3/uL (ref 0.0–0.4)
Eos: 1 %
Hematocrit: 41.5 % (ref 37.5–51.0)
Hemoglobin: 14 g/dL (ref 13.0–17.7)
IMMATURE GRANS (ABS): 0 10*3/uL (ref 0.0–0.1)
Immature Granulocytes: 0 %
LYMPHS ABS: 1.3 10*3/uL (ref 0.7–3.1)
LYMPHS: 24 %
MCH: 33.4 pg — AB (ref 26.6–33.0)
MCHC: 33.7 g/dL (ref 31.5–35.7)
MCV: 99 fL — AB (ref 79–97)
MONOS ABS: 0.4 10*3/uL (ref 0.1–0.9)
Monocytes: 8 %
NEUTROS ABS: 3.6 10*3/uL (ref 1.4–7.0)
Neutrophils: 66 %
PLATELETS: 272 10*3/uL (ref 150–379)
RBC: 4.19 x10E6/uL (ref 4.14–5.80)
RDW: 14.2 % (ref 12.3–15.4)
WBC: 5.5 10*3/uL (ref 3.4–10.8)

## 2017-05-24 LAB — COMPREHENSIVE METABOLIC PANEL
A/G RATIO: 2 (ref 1.2–2.2)
ALT: 12 IU/L (ref 0–44)
AST: 21 IU/L (ref 0–40)
Albumin: 4.3 g/dL (ref 3.5–4.8)
Alkaline Phosphatase: 92 IU/L (ref 39–117)
BILIRUBIN TOTAL: 0.8 mg/dL (ref 0.0–1.2)
BUN / CREAT RATIO: 11 (ref 10–24)
BUN: 10 mg/dL (ref 8–27)
CHLORIDE: 103 mmol/L (ref 96–106)
CO2: 26 mmol/L (ref 20–29)
Calcium: 9.8 mg/dL (ref 8.6–10.2)
Creatinine, Ser: 0.88 mg/dL (ref 0.76–1.27)
GFR calc non Af Amer: 82 mL/min/{1.73_m2} (ref >59)
GFR, EST AFRICAN AMERICAN: 95 mL/min/{1.73_m2} (ref >59)
Globulin, Total: 2.2 g/dL (ref 1.5–4.5)
Glucose: 83 mg/dL (ref 65–99)
POTASSIUM: 4.7 mmol/L (ref 3.5–5.2)
Sodium: 142 mmol/L (ref 134–144)
TOTAL PROTEIN: 6.5 g/dL (ref 6.0–8.5)

## 2017-06-07 ENCOUNTER — Telehealth (INDEPENDENT_AMBULATORY_CARE_PROVIDER_SITE_OTHER): Payer: Self-pay | Admitting: Physician Assistant

## 2017-06-12 ENCOUNTER — Telehealth (INDEPENDENT_AMBULATORY_CARE_PROVIDER_SITE_OTHER): Payer: Self-pay | Admitting: Physician Assistant

## 2017-06-12 NOTE — Telephone Encounter (Signed)
Patient's  Wife calling requesting a rx  [facility]  at [place]  [phone]  Thank you

## 2017-06-12 NOTE — Telephone Encounter (Signed)
FWD to PCP. [name] S [name], CMA  

## 2017-06-13 ENCOUNTER — Telehealth (INDEPENDENT_AMBULATORY_CARE_PROVIDER_SITE_OTHER): Payer: Self-pay | Admitting: Physician Assistant

## 2017-06-13 ENCOUNTER — Other Ambulatory Visit (INDEPENDENT_AMBULATORY_CARE_PROVIDER_SITE_OTHER): Payer: Self-pay | Admitting: Physician Assistant

## 2017-06-13 DIAGNOSIS — I824Y2 Acute embolism and thrombosis of unspecified deep veins of left proximal lower extremity: Secondary | ICD-10-CM

## 2017-06-13 MED ORDER — RIVAROXABAN 20 MG PO TABS: 20.0000 mg | ORAL_TABLET | Freq: Every day | ORAL | 11 refills | Status: DC

## 2017-06-13 NOTE — Telephone Encounter (Signed)
Patient's  Wife calling requesting a rx  Xarelto call to [facility]  at [place]  [phone]  Thank you  °

## 2017-06-13 NOTE — Telephone Encounter (Signed)
Prescription of Xarelto sent.

## 2017-06-22 ENCOUNTER — Encounter (INDEPENDENT_AMBULATORY_CARE_PROVIDER_SITE_OTHER): Payer: Self-pay | Admitting: Physician Assistant

## 2017-06-22 ENCOUNTER — Ambulatory Visit (INDEPENDENT_AMBULATORY_CARE_PROVIDER_SITE_OTHER): Payer: Medicare Other | Admitting: Physician Assistant

## 2017-06-22 VITALS — BP 135/78 | HR 74 | Temp 97.8°F | Wt 135.0 lb

## 2017-06-22 DIAGNOSIS — I871 Compression of vein: Secondary | ICD-10-CM | POA: Diagnosis not present

## 2017-06-22 DIAGNOSIS — I824Y2 Acute embolism and thrombosis of unspecified deep veins of left proximal lower extremity: Secondary | ICD-10-CM

## 2017-06-22 MED ORDER — RIVAROXABAN 20 MG PO TABS: 20.0000 mg | ORAL_TABLET | Freq: Every day | ORAL | 11 refills | Status: DC

## 2017-06-22 MED FILL — XARELTO 20 MG TABLET: 20 | 30 days supply | Qty: 30 | Fill #0

## 2017-06-22 NOTE — Progress Notes (Signed)
Subjective:  Patient ID: Derek Bentley, male    DOB: 01-Jul-1936  Age: 81 y.o. MRN: [id number]  CC: f/u DVT  HPI Derek Bentley is a 81 y.o. male with a PMH of BPH, SBO, paroxysmal Afib, diverticulitis, eczema, and spinal stenosis presents on f/u of LLE DVT. Reports taking Xarelto as directed except for when he had an episode of right forearm swelling a couple weeks ago. He began to take Xarelto every other day thinking his swelling would go down. Swelling eventually subsided. Has been taking Xarelto every other day since. Was given a coupon for Xarelto discount but was not honored at the pharmacy. Went to another pharmacy and was given one month of Xarelto for free. Generally, his LLE is reducing in size, although, the foot is still swollen.  In regards to May-Thurner syndrome, Dr. Randie Heinz from vascular surgery declined consult citing no need since he already attended to patient while in hospital. Patient denies CP, palpitations, SOB, HA, abdominal pain, f/c/n/v, rash, or GI/GU sxs.    Outpatient Medications Prior to Visit  Medication Sig Dispense Refill  . Multiple Vitamin (MULTIVITAMIN WITH MINERALS) TABS tablet Take 1 tablet by mouth daily.    Marland Kitchen acetaminophen (TYLENOL) 500 MG tablet Take 500 mg by mouth every 6 (six) hours as needed for mild pain.    Marland Kitchen ascorbic acid (VITAMIN C) 500 MG tablet Take 500 mg by mouth daily.    Marland Kitchen b complex vitamins tablet Take 1 tablet by mouth daily.    . bisacodyl (DULCOLAX) 10 MG suppository Place 1 suppository (10 mg total) rectally daily as needed for moderate constipation. (Patient not taking: Reported on 05/12/2017) 12 suppository 0  . bisacodyl (DULCOLAX) 5 MG EC tablet Take 5 mg by mouth daily as needed for moderate constipation.    . calcium-vitamin D (OSCAL WITH D) 500-200 MG-UNIT per tablet Take 1 tablet by mouth daily with breakfast.    . Cyanocobalamin (VITAMIN B 12 PO) Take 1 tablet by mouth daily.    Marland Kitchen docusate sodium (COLACE) 100 MG capsule Take 1  capsule (100 mg total) by mouth 2 (two) times daily as needed for mild constipation. (Patient not taking: Reported on 05/12/2017) 20 capsule 1  . FOLIC ACID PO Take 1 tablet by mouth daily.    . rivaroxaban (XARELTO) 20 MG TABS tablet Take 1 tablet (20 mg total) by mouth daily with supper. 30 tablet 11  . Rivaroxaban 15 & 20 MG TBPK Take as directed on package: Start with one 15mg  tablet by mouth twice a day with food. On Day 22, switch to one 20mg  tablet once a day with food. (Patient not taking: Reported on 05/23/2017) 51 each 0  . senna (SENOKOT) 8.6 MG TABS tablet Take 1 tablet (8.6 mg total) by mouth daily as needed for mild constipation. (Patient not taking: Reported on 05/12/2017) 20 each 0  . tamsulosin (FLOMAX) 0.4 MG CAPS capsule Take 1 capsule (0.4 mg total) by mouth daily after supper. (Patient not taking: Reported on 10/18/2015) 30 capsule 1  . TRIAMCINOLONE ACETONIDE EX Apply 1 application topically daily as needed (rash).     No facility-administered medications prior to visit.      ROS Review of Systems  Constitutional: Negative for chills, fever and malaise/fatigue.  Eyes: Negative for blurred vision.  Respiratory: Negative for shortness of breath.   Cardiovascular: Positive for leg swelling. Negative for chest pain and palpitations.  Gastrointestinal: Negative for abdominal pain and nausea.  Genitourinary: Negative for  dysuria and hematuria.  Musculoskeletal: Negative for joint pain and myalgias.  Skin: Negative for rash.  Neurological: Negative for tingling and headaches.  Psychiatric/Behavioral: Negative for depression. The patient is not nervous/anxious.     Objective:  BP 135/78 (BP Location: Left Arm, Patient Position: Sitting, Cuff Size: Normal)   Pulse 74   Temp 97.8 F (36.6 C) (Oral)   Wt 135 lb (61.2 kg)   SpO2 97%   BMI 20.53 kg/m   BP/Weight 06/22/2017 05/23/2017 05/14/2017  Systolic BP 135 110 107  Diastolic BP 78 69 60  Wt. (Lbs) 135 - -  BMI 20.53 -  -      Physical Exam  Constitutional: He is oriented to person, place, and time.  Thin, frail, NAD, polite  HENT:  Head: Normocephalic and atraumatic.  Eyes: No scleral icterus.  Cardiovascular: Normal rate, regular rhythm and normal heart sounds.   Pulmonary/Chest: Effort normal and breath sounds normal.  Musculoskeletal:  left foot with swelling same as in previous visit, however, calf and pretibial region are much less edematous. Right forearm with no swelling but very mild erythema, unsure if this is from laying arm on wheelchair armrest  Neurological: He is alert and oriented to person, place, and time. No cranial nerve deficit. Coordination normal.  Skin: Skin is warm and dry. No rash noted. No erythema. No pallor.  Psychiatric: He has a normal mood and affect. His behavior is normal. Thought content normal.  Vitals reviewed.    Assessment & Plan:   1. DVT, lower extremity, proximal, acute, left (HCC) - Continue on Xarelto 20 mg qday - Coupon printed out for patient but not honored by their pharmacy. I have coordinated meeting with pharmacy staff for pt to fill application for PASS program which could possibly have patient receive Xarelto for free. Appointment at 1045 hrs tomorrow.  2. May-Thurner Syndrome - Ambulatory referral to Vascular Surgery made on 05/23/17. However, referral note states, Dr. Randie Heinz from vascular surgery saw patient when he was in hospital and did not feel a consultation was needed.   Follow-up:   Loletta Specter PA

## 2017-06-22 NOTE — Patient Instructions (Addendum)
Please go to [facility] tomorrow at 10:45. Address is [address] [employer], [place].  Please bring your bank statements from the last two months.    Deep Vein Thrombosis A deep vein thrombosis (DVT) is a blood clot (thrombus) that usually occurs in a deep, larger vein of the lower leg or the pelvis, or in an upper extremity such as the arm. These are dangerous and can lead to serious and even life-threatening complications if the clot travels to the lungs. A DVT can damage the valves in your leg veins so that instead of flowing upward, the blood pools in the lower leg. This is called post-thrombotic syndrome, and it can result in pain, swelling, discoloration, and sores on the leg. What are the causes? A DVT is caused by the formation of a blood clot in your leg, pelvis, or arm. Usually, several things contribute to the formation of blood clots. A clot may develop when:  Your blood flow slows down.  Your vein becomes damaged in some way.  You have a condition that makes your blood clot more easily.  What increases the risk? A DVT is more likely to develop in:  People who are older, especially over 49 years of age.  People who are overweight (obese).  People who sit or lie still for a long time, such as during long-distance travel (over 4 hours), bed rest, hospitalization, or during recovery from certain medical conditions like a stroke.  People who do not engage in much physical activity (sedentary lifestyle).  People who have chronic breathing disorders.  People who have a personal or family history of blood clots or blood clotting disease.  People who have peripheral vascular disease (PVD), diabetes, or some types of cancer.  People who have heart disease, especially if the person had a recent heart attack or has congestive heart failure.  People who have neurological diseases that affect the legs (leg paresis).  People who have had a traumatic injury, such as  breaking a hip or leg.  People who have recently had major or lengthy surgery, especially on the hip, knee, or abdomen.  People who have had a central line placed inside a large vein.  People who take medicines that contain the hormone estrogen. These include birth control pills and hormone replacement therapy.  Pregnancy or during childbirth or the postpartum period.  Long plane flights (over 8 hours).  What are the signs or symptoms?  Symptoms of a DVT can include:  Swelling of your leg or arm, especially if one side is much worse.  Warmth and redness of your leg or arm, especially if one side is much worse.  Pain in your arm or leg. If the clot is in your leg, symptoms may be more noticeable or worse when you stand or walk.  A feeling of pins and needles, if the clot is in the arm.  The symptoms of a DVT that has traveled to the lungs (pulmonary embolism, PE) usually start suddenly and include:  Shortness of breath while active or at rest.  Coughing or coughing up blood or blood-tinged mucus.  Chest pain that is often worse with deep breaths.  Rapid or irregular heartbeat.  Feeling light-headed or dizzy.  Fainting.  Feeling anxious.  Sweating.  There may also be pain and swelling in a leg if that is where the blood clot started. These symptoms may represent a serious problem that is an emergency. Do not wait to see if the  symptoms will go away. Get medical help right away. Call your local emergency services (911 in the [place] Do not drive yourself to the hospital. How is this diagnosed? Your health care provider will take a medical history and perform a physical exam. You may also have other tests, including:  Blood tests to assess the clotting properties of your blood.  Imaging tests, such as CT, ultrasound, MRI, X-ray, and other tests to see if you have clots anywhere in your body.  How is this treated? After a DVT is identified, it can be treated. The type of  treatment that you receive depends on many factors, such as the cause of your DVT, your risk for bleeding or developing more clots, and other medical conditions that you have. Sometimes, a combination of treatments is necessary. Treatment options may be combined and include:  Monitoring the blood clot with ultrasound.  Taking medicines by mouth, such as newer blood thinners (anticoagulants), thrombolytics, or warfarin.  Taking anticoagulant medicine by injection or through an IV tube.  Wearing compression stockings or using different types ofdevices.  Surgery (rare) to remove the blood clot or to place a filter in your abdomen to stop the blood clot from traveling to your lungs.  Treatments for a DVT are often divided into immediate treatment and long-term treatment (up to 3 months after DVT). You can work with your health care provider to choose the treatment program that is best for you. Follow these instructions at home: If you are taking a newer oral anticoagulant:  Take the medicine every single day at the same time each day.  Understand what foods and drugs interact with this medicine.  Understand that there are no regular blood tests required when using this medicine.  Understand the side effects of this medicine, including excessive bruising or bleeding. Ask your health care provider or pharmacist about other possible side effects. If you are taking warfarin:  Understand how to take warfarin and know which foods can affect how warfarin works in [employer] account executive.  Understand that it is dangerous to take too much or too little warfarin. Too much warfarin increases the risk of bleeding. Too little warfarin continues to allow the risk for blood clots.  Follow your PT and INR blood testing schedule. The PT and INR results allow your health care provider to adjust your dose of warfarin. It is very important that you have your PT and INR tested as often as told by your health care  provider.  Avoid major changes in your diet, or tell your health care provider before you change your diet. Arrange a visit with a registered dietitian to answer your questions. Many foods, especially foods that are high in vitamin K, can interfere with warfarin and affect the PT and INR results. Eat a consistent amount of foods that are high in vitamin K, such as: ? Spinach, kale, broccoli, cabbage, collard greens, turnip greens, Brussels sprouts, peas, cauliflower, seaweed, and parsley. ? Beef liver and pork liver. ? Green tea. ? Soybean oil.  Tell your health care provider about any and all medicines, vitamins, and supplements that you take, including aspirin and other over-the-counter anti-inflammatory medicines. Be especially cautious with aspirin and anti-inflammatory medicines. Do not take those before you ask your health care provider if it is safe to do so. This is important because many medicines can interfere with warfarin and affect the PT and INR results.  Do not start or stop taking any over-the-counter or prescription medicine unless  your health care provider or pharmacist tells you to do so. If you take warfarin, you will also need to do these things:  Hold pressure over cuts for longer than usual.  Tell your dentist and other health care providers that you are taking warfarin before you have any procedures in which bleeding may occur.  Avoid alcohol or drink very small amounts. Tell your health care provider if you change your alcohol intake.  Do not use tobacco products, including cigarettes, chewing tobacco, and e-cigarettes. If you need help quitting, ask your health care provider.  Avoid contact sports.  General instructions  Take over-the-counter and prescription medicines only as told by your health care provider. Anticoagulant medicines can have side effects, including easy bruising and difficulty stopping bleeding. If you are prescribed an anticoagulant, you will  also need to do these things: ? Hold pressure over cuts for longer than usual. ? Tell your dentist and other health care providers that you are taking anticoagulants before you have any procedures in which bleeding may occur. ? Avoid contact sports.  Wear a medical alert bracelet or carry a medical alert card that says you have had a PE.  Ask your health care provider how soon you can go back to your normal activities. Stay active to prevent new blood clots from forming.  Make sure to exercise while traveling or when you have been sitting or standing for a long period of time. It is very important to exercise. Exercise your legs by walking or by tightening and relaxing your leg muscles often. Take frequent walks.  Wear compression stockings as told by your health care provider to help prevent more blood clots from forming.  Do not use tobacco products, including cigarettes, chewing tobacco, and e-cigarettes. If you need help quitting, ask your health care provider.  Keep all follow-up appointments with your health care provider. This is important. How is this prevented? Take these actions to decrease your risk of developing another DVT:  Exercise regularly. For at least 30 minutes every day, engage in: ? Activity that involves moving your arms and legs. ? Activity that encourages good blood flow through your body by increasing your heart rate.  Exercise your arms and legs every hour during long-distance travel (over 4 hours). Drink plenty of water and avoid drinking alcohol while traveling.  Avoid sitting or lying in bed for long periods of time without moving your legs.  Maintain a weight that is appropriate for your height. Ask your health care provider what weight is healthy for you.  If you are a woman who is over 74 years of age, avoid unnecessary use of medicines that contain estrogen. These include birth control pills.  Do not smoke, especially if you take estrogen medicines. If  you need help quitting, ask your health care provider.  If you are hospitalized, prevention measures may include:  Early walking after surgery, as soon as your health care provider says that it is safe.  Receiving anticoagulants to prevent blood clots.If you cannot take anticoagulants, other options may be available, such as wearing compression stockings or using different types of devices.  Get help right away if:  You have new or increased pain, swelling, or redness in an arm or leg.  You have numbness or tingling in an arm or leg.  You have shortness of breath while active or at rest.  You have chest pain.  You have a rapid or irregular heartbeat.  You feel light-headed or dizzy.  You cough up blood.  You notice blood in your vomit, bowel movement, or urine. These symptoms may represent a serious problem that is an emergency. Do not wait to see if the symptoms will go away. Get medical help right away. Call your local emergency services (911 in the [place] Do not drive yourself to the hospital. This information is not intended to replace advice given to you by your health care provider. Make sure you discuss any questions you have with your health care provider. Document Released: 10/17/2005 Document Revised: 03/24/2016 Document Reviewed: 02/11/2015 Elsevier Interactive Patient Education  2017 [employer].

## 2017-07-19 MED FILL — XARELTO 20 MG TABLET: 20 | 30 days supply | Qty: 30 | Fill #1

## 2017-08-14 MED FILL — XARELTO 20 MG TABLET: 20 | 14 days supply | Qty: 14 | Fill #2

## 2017-08-17 ENCOUNTER — Encounter (INDEPENDENT_AMBULATORY_CARE_PROVIDER_SITE_OTHER): Payer: Self-pay | Admitting: Physician Assistant

## 2017-08-17 ENCOUNTER — Ambulatory Visit (INDEPENDENT_AMBULATORY_CARE_PROVIDER_SITE_OTHER): Payer: Medicare Other | Admitting: Physician Assistant

## 2017-08-17 VITALS — BP 145/81 | HR 69 | Temp 97.2°F | Wt 129.8 lb

## 2017-08-17 DIAGNOSIS — I871 Compression of vein: Secondary | ICD-10-CM

## 2017-08-17 DIAGNOSIS — I824Y2 Acute embolism and thrombosis of unspecified deep veins of left proximal lower extremity: Secondary | ICD-10-CM | POA: Diagnosis not present

## 2017-08-17 DIAGNOSIS — Z23 Encounter for immunization: Secondary | ICD-10-CM

## 2017-08-17 NOTE — Patient Instructions (Signed)
Rivaroxaban oral tablets °What is this medicine? °RIVAROXABAN (ri va ROX a ban) is an anticoagulant (blood thinner). It is used to treat blood clots in the lungs or in the veins. It is also used after knee or hip surgeries to prevent blood clots. It is also used to lower the chance of stroke in people with a medical condition called atrial fibrillation. °This medicine may be used for other purposes; ask your health care provider or pharmacist if you have questions. °COMMON BRAND NAME(S): Xarelto, Xarelto Starter Pack °What should I tell my health care provider before I take this medicine? °They need to know if you have any of these conditions: °-bleeding disorders °-bleeding in the brain °-blood in your stools (black or tarry stools) or if you have blood in your vomit °-history of stomach bleeding °-kidney disease °-liver disease °-low blood counts, like low white cell, platelet, or red cell counts °-recent or planned spinal or epidural procedure °-take medicines that treat or prevent blood clots °-an unusual or allergic reaction to rivaroxaban, other medicines, foods, dyes, or preservatives °-pregnant or trying to get pregnant °-breast-feeding °How should I use this medicine? °Take this medicine by mouth with a glass of water. Follow the directions on the prescription label. Take your medicine at regular intervals. Do not take it more often than directed. Do not stop taking except on your doctor's advice. Stopping this medicine may increase your risk of a blood clot. Be sure to refill your prescription before you run out of medicine. °If you are taking this medicine after hip or knee replacement surgery, take it with or without food. If you are taking this medicine for atrial fibrillation, take it with your evening meal. If you are taking this medicine to treat blood clots, take it with food at the same time each day. If you are unable to swallow your tablet, you may crush the tablet and mix it in applesauce. Then,  immediately eat the applesauce. You should eat more food right after you eat the applesauce containing the crushed tablet. °Talk to your pediatrician regarding the use of this medicine in children. Special care may be needed. °Overdosage: If you think you have taken too much of this medicine contact a poison control center or emergency room at once. °NOTE: This medicine is only for you. Do not share this medicine with others. °What if I miss a dose? °If you take your medicine once a day and miss a dose, take the missed dose as soon as you remember. If you take your medicine twice a day and miss a dose, take the missed dose immediately. In this instance, 2 tablets may be taken at the same time. The next day you should take 1 tablet twice a day as directed. °What may interact with this medicine? °Do not take this medicine with any of the following medications: °-defibrotide °This medicine may also interact with the following medications: °-aspirin and aspirin-like medicines °-certain antibiotics like erythromycin, azithromycin, and clarithromycin °-certain medicines for fungal infections like ketoconazole and itraconazole °-certain medicines for irregular heart beat like amiodarone, quinidine, dronedarone °-certain medicines for seizures like carbamazepine, phenytoin °-certain medicines that treat or prevent blood clots like warfarin, enoxaparin, and dalteparin °-conivaptan °-diltiazem °-felodipine °-indinavir °-lopinavir; ritonavir °-NSAIDS, medicines for pain and inflammation, like ibuprofen or naproxen °-ranolazine °-rifampin °-ritonavir °-SNRIs, medicines for depression, like desvenlafaxine, duloxetine, levomilnacipran, venlafaxine °-SSRIs, medicines for depression, like citalopram, escitalopram, fluoxetine, fluvoxamine, paroxetine, sertraline °-St. John's wort °-verapamil °This list may not describe all   possible interactions. Give your health care provider a list of all the medicines, herbs, non-prescription  drugs, or dietary supplements you use. Also tell them if you smoke, drink alcohol, or use illegal drugs. Some items may interact with your medicine. °What should I watch for while using this medicine? °Visit your doctor or health care professional for regular checks on your progress. °Notify your doctor or health care professional and seek emergency treatment if you develop breathing problems; changes in vision; chest pain; severe, sudden headache; pain, swelling, warmth in the leg; trouble speaking; sudden numbness or weakness of the face, arm or leg. These can be signs that your condition has gotten worse. °If you are going to have surgery or other procedure, tell your doctor that you are taking this medicine. °What side effects may I notice from receiving this medicine? °Side effects that you should report to your doctor or health care professional as soon as possible: °-allergic reactions like skin rash, itching or hives, swelling of the face, lips, or tongue °-back pain °-redness, blistering, peeling or loosening of the skin, including inside the mouth °-signs and symptoms of bleeding such as bloody or black, tarry stools; red or dark-brown urine; spitting up blood or brown material that looks like coffee grounds; red spots on the skin; unusual bruising or bleeding from the eye, gums, or nose °Side effects that usually do not require medical attention (report to your doctor or health care professional if they continue or are bothersome): °-dizziness °-muscle pain °This list may not describe all possible side effects. Call your doctor for medical advice about side effects. You may report side effects to FDA at 1-800-FDA-1088. °Where should I keep my medicine? °Keep out of the reach of children. °Store at room temperature between 15 and 30 degrees C (59 and 86 degrees F). Throw away any unused medicine after the expiration date. °NOTE: This sheet is a summary. It may not cover all possible information. If you  have questions about this medicine, talk to your doctor, pharmacist, or health care provider. °© 2018 Elsevier/Gold Standard (2016-07-06 16:29:33) ° °

## 2017-08-17 NOTE — Progress Notes (Signed)
Subjective:  Patient ID: Derek Bentley, male    DOB: 11/27/1935  Age: 81 y.o. MRN: [id number]  CC: f/u DVT  HPI  Derek Bentley is a 81 y.o. male with a PMH of BPH, SBO, paroxysmal Afib, diverticulitis, eczema, and spinal stenosis presents to f/u on DVT and medications. He had been seen approximately 3 months ago and had concerns of the affordability of Xarelto. [facility] and was enrolled in the patient assistance program to receive Xarelto for free or a reduced price but has not gotten final approval. Reports swelling of the LLE has subsided. Wearing compression stockings as directed but does not have them on today. Walks at home with the aid of a walker. Does not endorse LE pain, CP, SOB, HA, abdominal pain, f/c/n/v.  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 as he already saw patient in hospital when he was admitted. Pt and wife believe Dr. Randie Heinzain did not give a second thought to considering any alternative treatment besides Xarelto and would appreciate another referral.      Outpatient Medications Prior to Visit  Medication Sig Dispense Refill  . acetaminophen (TYLENOL) 500 MG tablet Take 500 mg by mouth every 6 (six) hours as needed for mild pain.    Marland Kitchen. ascorbic acid (VITAMIN C) 500 MG tablet Take 500 mg by mouth daily.    Marland Kitchen. b complex vitamins tablet Take 1 tablet by mouth daily.    . bisacodyl (DULCOLAX) 10 MG suppository Place 1 suppository (10 mg total) rectally daily as needed for moderate constipation. (Patient not taking: Reported on 05/12/2017) 12 suppository 0  . bisacodyl (DULCOLAX) 5 MG EC tablet Take 5 mg by mouth daily as needed for moderate constipation.    . calcium-vitamin D (OSCAL WITH D) 500-200 MG-UNIT per tablet Take 1 tablet by mouth daily with breakfast.    . Cyanocobalamin (VITAMIN B 12 PO) Take 1 tablet by mouth daily.    Marland Kitchen. docusate sodium (COLACE) 100 MG  capsule Take 1 capsule (100 mg total) by mouth 2 (two) times daily as needed for mild constipation. (Patient not taking: Reported on 05/12/2017) 20 capsule 1  . FOLIC ACID PO Take 1 tablet by mouth daily.    . Multiple Vitamin (MULTIVITAMIN WITH MINERALS) TABS tablet Take 1 tablet by mouth daily.    . rivaroxaban (XARELTO) 20 MG TABS tablet Take 1 tablet (20 mg total) by mouth daily with supper. 30 tablet 11  . senna (SENOKOT) 8.6 MG TABS tablet Take 1 tablet (8.6 mg total) by mouth daily as needed for mild constipation. (Patient not taking: Reported on 05/12/2017) 20 each 0  . tamsulosin (FLOMAX) 0.4 MG CAPS capsule Take 1 capsule (0.4 mg total) by mouth daily after supper. (Patient not taking: Reported on 10/18/2015) 30 capsule 1  . TRIAMCINOLONE ACETONIDE EX Apply 1 application topically daily as needed (rash).     No facility-administered medications prior to visit.      ROS Review of Systems  Constitutional: Negative for chills, fever and malaise/fatigue.  Eyes: Negative for blurred vision.  Respiratory: Negative for shortness of breath.   Cardiovascular: Negative for chest pain and palpitations.  Gastrointestinal: Negative for abdominal pain and nausea.  Genitourinary: Negative for dysuria and hematuria.  Musculoskeletal: Negative for joint pain and myalgias.  Skin: Negative for rash.  Neurological: Negative for tingling and headaches.  Psychiatric/Behavioral: Negative for depression.  The patient is not nervous/anxious.     Objective:  There were no vitals taken for this visit.  BP/Weight 06/22/2017 05/23/2017 05/14/2017  Systolic BP 135 110 107  Diastolic BP 78 69 60  Wt. (Lbs) 135 - -  BMI 20.53 - -      Physical Exam  Constitutional: He is oriented to person, place, and time.  Thin, frail, NAD, polite, using wheelchair (parking lot and entrance to clinic too far to walk)  HENT:  Head: Normocephalic and atraumatic.  Eyes: No scleral icterus.  Neck: Normal range of  motion. Neck supple. No thyromegaly present.  Cardiovascular: Normal rate, regular rhythm and normal heart sounds.   Left lower extremity without edema, erythema, increased warmth, or tenderness. No carotid or femoral bruit heard bilaterally.  Pulmonary/Chest: Effort normal and breath sounds normal. No respiratory distress. He has no wheezes.  Abdominal: Soft. Bowel sounds are normal. There is no tenderness.  Musculoskeletal: He exhibits no edema.  Neurological: He is alert and oriented to person, place, and time. No cranial nerve deficit. Coordination normal.  Skin: Skin is warm and dry. No rash noted. No erythema. No pallor.  Psychiatric: He has a normal mood and affect. His behavior is normal. Thought content normal.  Vitals reviewed.    Assessment & Plan:     1. DVT, lower extremity, proximal, acute, left (HCC) - Continue on Xarelto 20 mg. I have personally called and verified patient has enough refills to pick up. His PASS program application has been completed and is awaiting signature from attending physician. I have explained all this to patient and he communicates understanding.   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 Need for prophylactic vaccination and inoculation against influenza - Pt declined  4. Need for prophylactic vaccination against Streptococcus pneumoniae (pneumococcus) - Pt declined  5. Need for Tdap vaccination - Tdap vaccine greater than or equal to 7yo IM   Follow-up: Return in about 4 weeks (around 09/14/2017).   Loletta Specter PA

## 2017-08-22 ENCOUNTER — Other Ambulatory Visit: Payer: Self-pay | Admitting: *Deleted

## 2017-08-22 DIAGNOSIS — I824Y2 Acute embolism and thrombosis of unspecified deep veins of left proximal lower extremity: Secondary | ICD-10-CM

## 2017-08-22 MED ORDER — RIVAROXABAN 20 MG PO TABS: 20.0000 mg | ORAL_TABLET | Freq: Every day | ORAL | 11 refills | Status: DC

## 2017-08-22 NOTE — Telephone Encounter (Signed)
PRINTED FOR PASS PROGRAM 

## 2017-08-30 MED FILL — XARELTO 20 MG TABLET: 20 | 14 days supply | Qty: 14 | Fill #3

## 2017-09-11 MED FILL — XARELTO 20 MG TABLET: 20 | 14 days supply | Qty: 14 | Fill #4

## 2017-09-13 ENCOUNTER — Other Ambulatory Visit: Payer: Self-pay

## 2017-09-13 ENCOUNTER — Emergency Department (HOSPITAL_COMMUNITY)
Admission: EM | Admit: 2017-09-13 | Discharge: 2017-09-13 | Disposition: A | Discharge status: Home / Self Care | Payer: Medicare Other | Attending: Emergency Medicine | Admitting: Emergency Medicine

## 2017-09-13 ENCOUNTER — Emergency Department (HOSPITAL_COMMUNITY): Payer: Medicare Other

## 2017-09-13 ENCOUNTER — Encounter (HOSPITAL_COMMUNITY): Payer: Self-pay | Admitting: *Deleted

## 2017-09-13 DIAGNOSIS — Z7901 Long term (current) use of anticoagulants: Secondary | ICD-10-CM | POA: Diagnosis not present

## 2017-09-13 DIAGNOSIS — K5900 Constipation, unspecified: Secondary | ICD-10-CM | POA: Diagnosis not present

## 2017-09-13 DIAGNOSIS — Z79899 Other long term (current) drug therapy: Secondary | ICD-10-CM | POA: Diagnosis not present

## 2017-09-13 LAB — COMPREHENSIVE METABOLIC PANEL
ALK PHOS: 83 U/L (ref 38–126)
ALT: 15 U/L — ABNORMAL LOW (ref 17–63)
ANION GAP: 12 (ref 5–15)
AST: 31 U/L (ref 15–41)
Albumin: 3.9 g/dL (ref 3.5–5.0)
BILIRUBIN TOTAL: 1.6 mg/dL — AB (ref 0.3–1.2)
BUN: 8 mg/dL (ref 6–20)
CO2: 22 mmol/L (ref 22–32)
Calcium: 9.2 mg/dL (ref 8.9–10.3)
Chloride: 105 mmol/L (ref 101–111)
Creatinine, Ser: 1.07 mg/dL (ref 0.61–1.24)
GFR calc non Af Amer: >60 mL/min (ref >60)
Glucose, Bld: 78 mg/dL (ref 65–99)
Potassium: 3.5 mmol/L (ref 3.5–5.1)
Sodium: 139 mmol/L (ref 135–145)
TOTAL PROTEIN: 6.3 g/dL — AB (ref 6.5–8.1)

## 2017-09-13 LAB — CBC WITH DIFFERENTIAL/PLATELET
Basophils Absolute: 0 10*3/uL (ref 0.0–0.1)
Basophils Relative: 1 %
Eosinophils Absolute: 0 10*3/uL (ref 0.0–0.7)
Eosinophils Relative: 1 %
HEMATOCRIT: 41 % (ref 39.0–52.0)
HEMOGLOBIN: 14.1 g/dL (ref 13.0–17.0)
LYMPHS ABS: 1.9 10*3/uL (ref 0.7–4.0)
Lymphocytes Relative: 31 %
MCH: 33.7 pg (ref 26.0–34.0)
MCHC: 34.4 g/dL (ref 30.0–36.0)
MCV: 98.1 fL (ref 78.0–100.0)
MONOS PCT: 8 %
Monocytes Absolute: 0.5 10*3/uL (ref 0.1–1.0)
NEUTROS ABS: 3.8 10*3/uL (ref 1.7–7.7)
NEUTROS PCT: 61 %
Platelets: 172 10*3/uL (ref 150–400)
RBC: 4.18 MIL/uL — ABNORMAL LOW (ref 4.22–5.81)
RDW: 13.4 % (ref 11.5–15.5)
WBC: 6.2 10*3/uL (ref 4.0–10.5)

## 2017-09-13 LAB — I-STAT CHEM 8, ED
BUN: 8 mg/dL (ref 6–20)
CHLORIDE: 104 mmol/L (ref 101–111)
Calcium, Ion: 1.09 mmol/L — ABNORMAL LOW (ref 1.15–1.40)
Creatinine, Ser: 0.7 mg/dL (ref 0.61–1.24)
Glucose, Bld: 77 mg/dL (ref 65–99)
HCT: 41 % (ref 39.0–52.0)
Hemoglobin: 13.9 g/dL (ref 13.0–17.0)
POTASSIUM: 3.6 mmol/L (ref 3.5–5.1)
SODIUM: 141 mmol/L (ref 135–145)
TCO2: 24 mmol/L (ref 22–32)

## 2017-09-13 LAB — LIPASE, BLOOD: Lipase: 27 U/L (ref 11–51)

## 2017-09-13 MED ORDER — LIDOCAINE HCL 2 % EX GEL
1.0000 | Freq: Once | CUTANEOUS | Status: AC
Start: 2017-09-13 — End: 2017-09-13
  Administered 2017-09-13: 1 via TOPICAL
  Filled 2017-09-13: qty 20

## 2017-09-13 MED ORDER — ONDANSETRON HCL 4 MG/2ML IJ SOLN
4.0000 mg | Freq: Once | INTRAMUSCULAR | Status: AC
  Administered 2017-09-13: 4 mg via INTRAVENOUS
  Filled 2017-09-13: qty 2

## 2017-09-13 MED ORDER — IOPAMIDOL (ISOVUE-300) INJECTION 61%
INTRAVENOUS | Status: AC
  Administered 2017-09-13: 100 mL
  Filled 2017-09-13: qty 100

## 2017-09-13 MED ORDER — POLYETHYLENE GLYCOL 3350 17 G PO PACK
17.0000 g | PACK | Freq: Two times a day (BID) | ORAL | 0 refills | Status: DC
Start: 2017-09-13 — End: 2018-01-02

## 2017-09-13 MED ORDER — MORPHINE SULFATE (PF) 4 MG/ML IV SOLN
4.0000 mg | Freq: Once | INTRAVENOUS | Status: AC
  Administered 2017-09-13: 4 mg via INTRAVENOUS
  Filled 2017-09-13: qty 1

## 2017-09-13 MED ORDER — SODIUM CHLORIDE 0.9 % IV BOLUS (SEPSIS)
1000.0000 mL | Freq: Once | INTRAVENOUS | Status: AC
  Administered 2017-09-13: 1000 mL via INTRAVENOUS

## 2017-09-13 MED ORDER — FLEET ENEMA 7-19 GM/118ML RE ENEM
1.0000 | ENEMA | Freq: Once | RECTAL | Status: AC
Start: 2017-09-13 — End: 2017-09-13
  Administered 2017-09-13: 1 via RECTAL
  Filled 2017-09-13: qty 1

## 2017-09-13 NOTE — Discharge Instructions (Signed)
Take miralax twice daily until you have normal bowel movements for a week.   See your doctor  Return to ER if you have worse abdominal pain, vomiting, fever, severe pain.

## 2017-09-13 NOTE — ED Provider Notes (Signed)
[facility] EMERGENCY DEPARTMENT Provider Note   CSN: [id number] Arrival date & time: 09/13/17  1006     History   Chief Complaint Chief Complaint  Patient presents with  . Constipation    HPI Derek Bentley is a 81 y.o. male history of small bowel obstruction, diverticulitis here presenting with abdominal pain, constipation.  Patient states that he is constipated for the last 5 days.  He tried some stool softeners and was able to have a bowel movement about 5 days ago.  Since then he has been constipated and has been unable to have a bowel movement despite taking stool softeners.  Denies any vomiting or fevers.  Patient does have a history of small bowel obstruction previously.   The history is provided by the patient.    Past Medical History:  Diagnosis Date  . Arthritis   . BPH (benign prostatic hyperplasia)   . Diverticulitis   . DVT (deep venous thrombosis) (HCC) 05/12/2017   LLE  . Eczema   . Ileus (HCC)   . Obstruction (acquired) of bladder neck or vesicourethral orifice   . PAF (paroxysmal atrial fibrillation) (HCC)   . SBO (small bowel obstruction) (HCC)   . Spinal stenosis     Patient Active Problem List   Diagnosis Date Noted  . 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 Protein-calorie malnutrition, severe 05/14/2017  . DVT, lower extremity, proximal, acute, left (HCC) 05/12/2017  . DVT (deep venous thrombosis) (HCC) 05/12/2017  . Spinal stenosis of lumbar region 07/29/2015  . Chronic constipation 10/01/2008    Past Surgical History:  Procedure Laterality Date  . BACK SURGERY    . CATARACT EXTRACTION W/ INTRAOCULAR LENS  IMPLANT, BILATERAL Bilateral   . CIRCUMCISION    . FRACTURE SURGERY    . HIP FRACTURE SURGERY Left   . INGUINAL HERNIA REPAIR Bilateral   . TONSILLECTOMY         Home Medications    Prior to Admission medications   Medication Sig Start Date End Date Taking? Authorizing Provider  acetaminophen (TYLENOL) 500 MG  tablet Take 500 mg by mouth every 6 (six) hours as needed for mild pain.   Yes [provider]  ascorbic acid (VITAMIN C) 500 MG tablet Take 500 mg by mouth daily.   Yes [provider]  b complex vitamins tablet Take 1 tablet by mouth daily.   Yes [provider]  bisacodyl (DULCOLAX) 5 MG EC tablet Take 5 mg by mouth daily as needed for moderate constipation.   Yes [provider]  calcium-vitamin D (OSCAL WITH D) 500-200 MG-UNIT per tablet Take 1 tablet by mouth daily with breakfast.   Yes [provider]  Cyanocobalamin (VITAMIN B 12 PO) Take 1 tablet by mouth daily.   Yes [provider]  FOLIC ACID PO Take 1 tablet by mouth daily.   Yes [provider]  Multiple Vitamin (MULTIVITAMIN WITH MINERALS) TABS tablet Take 1 tablet by mouth daily.   Yes [provider]  rivaroxaban (XARELTO) 20 MG TABS tablet Take 1 tablet (20 mg total) by mouth daily with supper. 08/22/17  Yes Quentin Angst, MD  TRIAMCINOLONE ACETONIDE EX Apply 1 application topically daily as needed (rash).   Yes [provider]    Family History Family History  Problem Relation Age of Onset  . Stroke Mother   . Heart attack Father   . Hypertension Other   . Heart disease Sister   . Deep vein thrombosis  Neg Hx     Social History Social History   Tobacco Use  . Smoking status: Never Smoker  . Smokeless tobacco: Never Used  Substance Use Topics  . Alcohol use: No  . Drug use: No     Allergies   Tramadol   Review of Systems Review of Systems  Gastrointestinal: Positive for constipation.  All other systems reviewed and are negative.    Physical Exam Updated Vital Signs BP 140/82   Pulse (!) 109   Temp 97.6 F (36.4 C) (Oral)   Resp 16   Ht 5\' 7"  (1.702 m)   Wt 56.7 kg (125 lb)   SpO2 99%   BMI 19.58 kg/m   Physical Exam  Constitutional: He is oriented to person, place, and time.  Uncomfortable   HENT:    Head: Normocephalic.  MM slightly dry   Eyes: Conjunctivae and EOM are normal. Pupils are equal, round, and reactive to light.  Neck: Normal range of motion. Neck supple.  Cardiovascular: Regular rhythm and normal heart sounds.  Tachycardic   Pulmonary/Chest: Effort normal and breath sounds normal. No stridor. No respiratory distress. He has no wheezes.  Abdominal:  Slightly distended, mild LLQ tenderness   Genitourinary:  Genitourinary Comments: Rectal- + stool impaction.   Musculoskeletal: Normal range of motion.  Neurological: He is alert and oriented to person, place, and time. He displays normal reflexes. No cranial nerve deficit. Coordination normal.  Skin: Skin is warm.  Psychiatric: He has a normal mood and affect.  Nursing note and vitals reviewed.    ED Treatments / Results  Labs (all labs ordered are listed, but only abnormal results are displayed) Labs Reviewed  CBC WITH DIFFERENTIAL/PLATELET - Abnormal; Notable for the following components:      Result Value   RBC 4.18 (*)    All other components within normal limits  COMPREHENSIVE METABOLIC PANEL - Abnormal; Notable for the following components:   Total Protein 6.3 (*)    ALT 15 (*)    Total Bilirubin 1.6 (*)    All other components within normal limits  I-STAT CHEM 8, ED - Abnormal; Notable for the following components:   Calcium, Ion 1.09 (*)    All other components within normal limits  LIPASE, BLOOD    EKG  EKG Interpretation None       Radiology Ct Abdomen Pelvis W Contrast  Result Date: 09/13/2017 CLINICAL DATA:  Constipation and abdominal pain with history of SBO EXAM: CT ABDOMEN AND PELVIS WITH CONTRAST TECHNIQUE: Multidetector CT imaging of the abdomen and pelvis was performed using the standard protocol following bolus administration of intravenous contrast. CONTRAST:  100mL ISOVUE-300 IOPAMIDOL (ISOVUE-300) INJECTION 61% COMPARISON:  09/13/2017, CT 05/12/2017, 10/18/2015 FINDINGS: Lower  chest: Lung bases demonstrate no acute consolidation or pleural effusion. Normal heart size. Hepatobiliary: No focal hepatic abnormality or biliary dilatation. Multiple calcified gallstones with possible gallbladder wall calcification as before. Pancreas: Unremarkable. No pancreatic ductal dilatation or surrounding inflammatory changes. Spleen: Normal in size without focal abnormality. Adrenals/Urinary Tract: Adrenal glands are within normal limits. Multiple bilateral renal cysts. No hydronephrosis. Bladder within normal limits. Stomach/Bowel: The stomach is nonenlarged. No dilated small bowel to suggest bowel obstruction. Moderate stool throughout the colon with moderate to large retained feces in the rectum. Sigmoid colon diverticular disease without acute inflammation Vascular/Lymphatic: Aortic atherosclerosis. No aneurysmal dilatation. No significantly enlarged lymph nodes. Reproductive: Slightly enlarged prostate with coarse calcification Other: Negative for free air or free fluid. Musculoskeletal: Stable sclerotic focus in  L5. Degenerative changes. Mild chronic compression of T12. Threaded screw fixation of the proximal left femur. IMPRESSION: 1. Negative for a bowel obstruction. Large amount of stool throughout the colon with large retained feces in the rectum. 2. Multiple calcified gallstones and possible gallbladder wall calcification as before 3. Sigmoid colon diverticular disease without acute inflammation Electronically Signed   By: Jasmine PangKim  Fujinaga M.D.   On: 09/13/2017 15:19   Dg Abd Acute W/chest  Result Date: 09/13/2017 CLINICAL DATA:  Fecal impaction.  Pain. EXAM: DG ABDOMEN ACUTE W/ 1V CHEST COMPARISON:  CT 05/12/2017. FINDINGS: Frontal view of the chest demonstrates midline trachea. Normal heart size. Atherosclerosis in the transverse aorta. No pleural effusion or pneumothorax. Clear lungs. Abdominal films demonstrate no free intraperitoneal air or significant air-fluid levels on upright  positioning. Large amount of stool within the rectum, suboptimally evaluated secondary to overlying bladder density. Stool also identified throughout the remainder of the colon. Probable phleboliths in the pelvis. No small bowel dilatation. Convex right thoracolumbar spine curvature with vertebral body height loss suspected T12. Severe osteoarthritis of both hips with possible component of developmental dysplasia on the right. IMPRESSION: No acute findings. Possible constipation. Aortic Atherosclerosis (ICD10-I70.0). Electronically Signed   By: Jeronimo GreavesKyle  Talbot M.D.   On: 09/13/2017 13:35    Procedures Fecal disimpaction Date/Time: 09/13/2017 1:57 PM Performed by: Charlynne PanderYao, Aivan Fillingim Hsienta, MD Authorized by: Charlynne PanderYao, Retina Bernardy Hsienta, MD  Consent: Verbal consent obtained. Risks and benefits: risks, benefits and alternatives were discussed Consent given by: patient Patient understanding: patient states understanding of the procedure being performed Patient consent: the patient's understanding of the procedure matches consent given Procedure consent: procedure consent matches procedure scheduled Patient identity confirmed: verbally with patient Preparation: Patient was prepped and draped in the usual sterile fashion. Local anesthesia used: yes  Anesthesia: Local anesthesia used: yes Local Anesthetic: LET (lido,epi,tetracaine)  Sedation: Patient sedated: no  Patient tolerance: Patient tolerated the procedure well with no immediate complications    (including critical care time)  Medications Ordered in ED Medications  ondansetron (ZOFRAN) injection 4 mg (4 mg Intravenous Given 09/13/17 1300)  morphine 4 MG/ML injection 4 mg (4 mg Intravenous Given 09/13/17 1300)  sodium chloride 0.9 % bolus 1,000 mL (0 mLs Intravenous Stopped 09/13/17 1514)  lidocaine (XYLOCAINE) 2 % jelly 1 application (1 application Topical Given 09/13/17 1315)  sodium phosphate (FLEET) 7-19 GM/118ML enema 1 enema (1 enema Rectal  Given 09/13/17 1301)  iopamidol (ISOVUE-300) 61 % injection (100 mLs  Contrast Given 09/13/17 1435)     Initial Impression / Assessment and Plan / ED Course  I have reviewed the triage vital signs and the nursing notes.  Pertinent labs & imaging results that were available during my care of the patient were reviewed by me and considered in my medical decision making (see chart for details).     Derek Bentley is a 81 y.o. male here with abdominal pain, constipation. Likely ileus vs constipation. Has hx of SBO so will get labs, xrays, CT ab/pel.   3:25 PM After disimpaction, he had multiple bowel movements. Felt better. CT and xray showed constipation, no SBO. Will dc home with miralax for constipation    Final Clinical Impressions(s) / ED Diagnoses   Final diagnoses:  None    ED Discharge Orders    None       Charlynne PanderYao, Mickaela Starlin Hsienta, MD 09/13/17 1527

## 2017-09-13 NOTE — ED Triage Notes (Signed)
Pt states constipation x 4 days, despite laxitives and stool softeners.

## 2017-09-13 NOTE — ED Notes (Signed)
Patient transported to CT 

## 2017-09-13 NOTE — ED Notes (Signed)
Patient able to ambulate at baseline  

## 2017-09-18 ENCOUNTER — Emergency Department (HOSPITAL_COMMUNITY): Payer: Medicare Other

## 2017-09-18 ENCOUNTER — Emergency Department (HOSPITAL_COMMUNITY)
Admission: EM | Admit: 2017-09-18 | Discharge: 2017-09-18 | Disposition: A | Discharge status: Home / Self Care | Payer: Medicare Other | Attending: Emergency Medicine | Admitting: Emergency Medicine

## 2017-09-18 ENCOUNTER — Other Ambulatory Visit: Payer: Self-pay

## 2017-09-18 ENCOUNTER — Encounter (HOSPITAL_COMMUNITY): Payer: Self-pay

## 2017-09-18 DIAGNOSIS — K5900 Constipation, unspecified: Secondary | ICD-10-CM

## 2017-09-18 DIAGNOSIS — Z79899 Other long term (current) drug therapy: Secondary | ICD-10-CM | POA: Insufficient documentation

## 2017-09-18 DIAGNOSIS — K5909 Other constipation: Secondary | ICD-10-CM | POA: Insufficient documentation

## 2017-09-18 DIAGNOSIS — Z7901 Long term (current) use of anticoagulants: Secondary | ICD-10-CM | POA: Insufficient documentation

## 2017-09-18 MED ORDER — FLEET ENEMA 7-19 GM/118ML RE ENEM
1.0000 | ENEMA | Freq: Once | RECTAL | Status: DC
  Filled 2017-09-18: qty 1

## 2017-09-18 NOTE — ED Notes (Signed)
Patient transported to X-ray 

## 2017-09-18 NOTE — Discharge Instructions (Signed)
Return to the ED with any concerns including abdominal pain, vomiting, blood in stools, decreased level of alertness/lethargy, or any other alarming symptoms

## 2017-09-18 NOTE — ED Notes (Signed)
Pt up to the bedside commode with minimal assist.

## 2017-09-18 NOTE — ED Notes (Signed)
Blanket given to patient; sprite and graham crackers given to wife.

## 2017-09-18 NOTE — ED Triage Notes (Signed)
Patient here with ongoing constipation, seen on Wednesday for same. Taking miralax with no BM x 5 days, complains of rectal pressure

## 2017-09-18 NOTE — ED Provider Notes (Signed)
[facility] Note   CSN: [id number] Arrival date & time: 09/18/17  0825     History   Chief Complaint No chief complaint on file.   HPI Derek Bentley is a 81 y.o. male.  HPI  Pt presenting with c/o constipation.  Pt states that after leaving the ED 5 days ago he continued to have multiple bowel movements.  He has been taking miralax twice daily.  Has not had BM in 5 days.  No abdominal pain.  Feels rectal pressure.  No vomiting.  No rectal bleeding.    Past Medical History:  Diagnosis Date  . Arthritis   . BPH (benign prostatic hyperplasia)   . Diverticulitis   . DVT (deep venous thrombosis) (HCC) 05/12/2017   LLE  . Eczema   . Ileus (HCC)   . Obstruction (acquired) of bladder neck or vesicourethral orifice   . PAF (paroxysmal atrial fibrillation) (HCC)   . SBO (small bowel obstruction) (HCC)   . Spinal stenosis     Patient Active Problem List   Diagnosis Date Noted  . 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 Protein-calorie malnutrition, severe 05/14/2017  . DVT, lower extremity, proximal, acute, left (HCC) 05/12/2017  . DVT (deep venous thrombosis) (HCC) 05/12/2017  . Spinal stenosis of lumbar region 07/29/2015  . Chronic constipation 10/01/2008    Past Surgical History:  Procedure Laterality Date  . BACK SURGERY    . CATARACT EXTRACTION W/ INTRAOCULAR LENS  IMPLANT, BILATERAL Bilateral   . CIRCUMCISION    . DECOMPRESSION L4-L5 and L3-L4,  MICRODISCECTOMY L4-L5     (2 LEVELS) N/A 07/29/2015   Performed by Jene EveryBeane, Jeffrey, MD at [facility]  . FRACTURE SURGERY    . HIP FRACTURE SURGERY Left   . INGUINAL HERNIA REPAIR Bilateral   . TONSILLECTOMY         Home Medications    Prior to Admission medications   Medication Sig Start Date End Date Taking? Authorizing Provider  acetaminophen (TYLENOL) 500 MG tablet Take 500 mg by mouth every 6 (six) hours as needed for mild pain.    [provider]  ascorbic acid  (VITAMIN C) 500 MG tablet Take 500 mg by mouth daily.    [provider]  b complex vitamins tablet Take 1 tablet by mouth daily.    [provider]  bisacodyl (DULCOLAX) 5 MG EC tablet Take 5 mg by mouth daily as needed for moderate constipation.    [provider]  calcium-vitamin D (OSCAL WITH D) 500-200 MG-UNIT per tablet Take 1 tablet by mouth daily with breakfast.    [provider]  Cyanocobalamin (VITAMIN B 12 PO) Take 1 tablet by mouth daily.    [provider]  FOLIC ACID PO Take 1 tablet by mouth daily.    [provider]  Multiple Vitamin (MULTIVITAMIN WITH MINERALS) TABS tablet Take 1 tablet by mouth daily.    [provider]  polyethylene glycol (MIRALAX / GLYCOLAX) packet Take 17 g 2 (two) times daily by mouth. 09/13/17   Charlynne PanderYao, David Hsienta, MD  rivaroxaban (XARELTO) 20 MG TABS tablet Take 1 tablet (20 mg total) by mouth daily with supper. 08/22/17   Quentin AngstJegede, Olugbemiga E, MD  TRIAMCINOLONE ACETONIDE EX Apply 1 application topically daily as needed (rash).    [provider]    Family History Family History  Problem Relation Age of Onset  . Stroke Mother   . Heart attack Father   .  Hypertension Other   . Heart disease Sister   . Deep vein thrombosis Neg Hx     Social History Social History   Tobacco Use  . Smoking status: Never Smoker  . Smokeless tobacco: Never Used  Substance Use Topics  . Alcohol use: No  . Drug use: No     Allergies   Tramadol   Review of Systems Review of Systems  ROS reviewed and all otherwise negative except for mentioned in HPI   Physical Exam Updated Vital Signs BP 133/72 (BP Location: Right Arm)   Pulse (!) 105   Temp 97.7 F (36.5 C) (Oral)   Resp 16   SpO2 96%  Vitals reviewed Physical Exam  Physical Examination: General appearance - alert, well appearing, and in no distress Mental status - alert, oriented to person, place, and time Eyes - no  conjunctival injection, no scleral ictuers Mouth - mucous membranes moist, pharynx normal without lesions Chest - clear to auscultation, no wheezes, rales or rhonchi, symmetric air entry Heart - normal rate, regular rhythm, normal S1, S2, no murmurs, rubs, clicks or gallops Abdomen - soft, nontender, nondistended, no masses or organomegaly, nabs Rectal - formed, but not hard, stool in rectal vault.  Disimpaction attempted but stool is soft- enema performed Extremities - peripheral pulses normal, no pedal edema, no clubbing or cyanosis Skin - normal coloration and turgor, no rashes   ED Treatments / Results  Labs (all labs ordered are listed, but only abnormal results are displayed) Labs Reviewed - No data to display  EKG  EKG Interpretation None       Radiology Dg Abdomen 1 View  Result Date: 09/18/2017 CLINICAL DATA:  Generalized abdominal pain. No bowel movement for 10 days. EXAM: ABDOMEN - 1 VIEW COMPARISON:  Radiographs 09/13/2017.  CT 09/13/2017. FINDINGS: The bowel gas pattern is nonobstructive. There is no supine evidence of free intraperitoneal air. Colonic stool burden does not appear significantly increased. Multiple pelvic calcifications are similar to previous studies, likely all phleboliths. No definite acute osseous findings. Patient is status post left femoral neck pinning. 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 There is multilevel spondylosis and grossly stable chronic compression deformities. IMPRESSION: No acute findings or significantly increased colonic stool burden. Electronically Signed   By: Carey BullocksWilliam  Veazey M.D.   On: 09/18/2017 16:26    Procedures Procedures (including critical care time)  Medications Ordered in ED Medications  sodium phosphate (FLEET) 7-19 GM/118ML enema 1 enema (not administered)     Initial Impression / Assessment and Plan / ED Course  I have reviewed the triage vital signs and the nursing notes.  Pertinent labs & imaging  results that were available during my care of the patient were reviewed by me and considered in my medical decision making (see chart for details).    4:01 PM pt has had a lot of stool output after enema.    Pt presenting with c/o constipation with rectal pressure.  Pt had a large amount of stool out after enema.  Xray is improved.  No abdominal tenderness.  Pt advised to continue miralax 2-3 times daily, then decrease dose if he starts to have soft stools.  Discharged with strict return precautions.  Pt agreeable with plan.  Final Clinical Impressions(s) / ED Diagnoses   Final diagnoses:  Constipation, unspecified constipation type    ED Discharge Orders    None       Phillis HaggisMabe, Martha L, MD 09/18/17 [phone]

## 2017-09-25 MED FILL — XARELTO 20 MG TABLET: 20 | 30 days supply | Qty: 30 | Fill #0

## 2017-10-17 MED FILL — XARELTO 20 MG TABLET: 20 | 30 days supply | Qty: 30 | Fill #1

## 2017-11-21 MED FILL — XARELTO 20 MG TABLET: 20 | 30 days supply | Qty: 30 | Fill #2

## 2017-12-15 MED FILL — XARELTO 20 MG TABLET: 20 | 30 days supply | Qty: 30 | Fill #3

## 2017-12-18 ENCOUNTER — Ambulatory Visit (INDEPENDENT_AMBULATORY_CARE_PROVIDER_SITE_OTHER): Payer: Medicare Other | Admitting: Physician Assistant

## 2018-01-02 ENCOUNTER — Other Ambulatory Visit: Payer: Self-pay

## 2018-01-02 ENCOUNTER — Ambulatory Visit (INDEPENDENT_AMBULATORY_CARE_PROVIDER_SITE_OTHER): Payer: Medicare Other | Admitting: Physician Assistant

## 2018-01-02 ENCOUNTER — Encounter (INDEPENDENT_AMBULATORY_CARE_PROVIDER_SITE_OTHER): Payer: Self-pay | Admitting: Physician Assistant

## 2018-01-02 VITALS — BP 133/74 | HR 74 | Temp 97.4°F | Wt 131.0 lb

## 2018-01-02 DIAGNOSIS — T68XXXA Hypothermia, initial encounter: Secondary | ICD-10-CM | POA: Diagnosis not present

## 2018-01-02 DIAGNOSIS — I10 Essential (primary) hypertension: Secondary | ICD-10-CM

## 2018-01-02 DIAGNOSIS — I871 Compression of vein: Secondary | ICD-10-CM

## 2018-01-02 MED ORDER — APIXABAN 5 MG PO TABS: 5.0000 mg | ORAL_TABLET | Freq: Two times a day (BID) | ORAL | 11 refills | Status: DC

## 2018-01-02 NOTE — Progress Notes (Signed)
Pt complains of chronic leg and hip pain

## 2018-01-02 NOTE — Patient Instructions (Addendum)
Please call the following to schedule an appointment:  [facility]   [address] [place], [place] [address] Phone: [phone] Fax: [phone]    Apixaban oral tablets What is this medicine? APIXABAN (a PIX a ban) is an anticoagulant (blood thinner). It is used to lower the chance of stroke in people with a medical condition called atrial fibrillation. It is also used to treat or prevent blood clots in the lungs or in the veins. This medicine may be used for other purposes; ask your health care provider or pharmacist if you have questions. COMMON BRAND NAME(S): Eliquis What should I tell my health care provider before I take this medicine? They need to know if you have any of these conditions: -bleeding disorders -bleeding in the brain -blood in your stools (black or tarry stools) or if you have blood in your vomit -history of stomach bleeding -kidney disease -liver disease -mechanical heart valve -an unusual or allergic reaction to apixaban, other medicines, foods, dyes, or preservatives -pregnant or trying to get pregnant -breast-feeding How should I use this medicine? Take this medicine by mouth with a glass of water. Follow the directions on the prescription label. You can take it with or without food. If it upsets your stomach, take it with food. Take your medicine at regular intervals. Do not take it more often than directed. Do not stop taking except on your doctor's advice. Stopping this medicine may increase your risk of a blot clot. Be sure to refill your prescription before you run out of medicine. Talk to your pediatrician regarding the use of this medicine in children. Special care may be needed. Overdosage: If you think you have taken too much of this medicine contact a poison control center or emergency room at once. NOTE: This medicine is only for you. Do not share this medicine with others. What if I miss a dose? If  you miss a dose, take it as soon as you can. If it is almost time for your next dose, take only that dose. Do not take double or extra doses. What may interact with this medicine? This medicine may interact with the following: -aspirin and aspirin-like medicines -certain medicines for fungal infections like ketoconazole and itraconazole -certain medicines for seizures like carbamazepine and phenytoin -certain medicines that treat or prevent blood clots like warfarin, enoxaparin, and dalteparin -clarithromycin -NSAIDs, medicines for pain and inflammation, like ibuprofen or naproxen -rifampin -ritonavir -St. John's wort This list may not describe all possible interactions. Give your health care provider a list of all the medicines, herbs, non-prescription drugs, or dietary supplements you use. Also tell them if you smoke, drink alcohol, or use illegal drugs. Some items may interact with your medicine. What should I watch for while using this medicine? Visit your doctor or health care professional for regular checks on your progress. Notify your doctor or health care professional and seek emergency treatment if you develop breathing problems; changes in vision; chest pain; severe, sudden headache; pain, swelling, warmth in the leg; trouble speaking; sudden numbness or weakness of the face, arm or leg. These can be signs that your condition has gotten worse. If you are going to have surgery or other procedure, tell your doctor that you are taking this medicine. What side effects may I notice from receiving this medicine? Side effects that you should report to your doctor or health care professional as soon as possible: -allergic reactions like skin rash, itching or hives, swelling  of the face, lips, or tongue -signs and symptoms of bleeding such as bloody or black, tarry stools; red or dark-brown urine; spitting up blood or brown material that looks like coffee grounds; red spots on the skin; unusual  bruising or bleeding from the eye, gums, or nose This list may not describe all possible side effects. Call your doctor for medical advice about side effects. You may report side effects to FDA at 1-800-FDA-1088. Where should I keep my medicine? Keep out of the reach of children. Store at room temperature between 20 and 25 degrees C (68 and 77 degrees F). Throw away any unused medicine after the expiration date. NOTE: This sheet is a summary. It may not cover all possible information. If you have questions about this medicine, talk to your doctor, pharmacist, or health care provider.  2018 Elsevier/Gold Standard (2016-05-09 11:54:23)

## 2018-01-02 NOTE — Progress Notes (Signed)
Subjective:  Patient ID: Derek Bentley, male    DOB: 1936-10-01  Age: 82 y.o. MRN: [id number]  CC: f/u HTN  HPI  Derek Bentley a 82 y.o.malewith a PMH of May-Thurner syndrome, DVT, BPH, SBO, paroxysmal Afib, diverticulitis, eczema, and spinal stenosis presents to f/u on HTN. BP is stable, feels well, no GI bleeding, does not endorse any other symptoms, and does not currently need refills.      Outpatient Medications Prior to Visit  Medication Sig Dispense Refill  . ascorbic acid (VITAMIN C) 500 MG tablet Take 500 mg by mouth daily.    Marland Kitchen b complex vitamins tablet Take 1 tablet by mouth daily.    . calcium-vitamin D (OSCAL WITH D) 500-200 MG-UNIT per tablet Take 1 tablet by mouth daily with breakfast.    . Cyanocobalamin (VITAMIN B 12 PO) Take 1 tablet by mouth daily.    . Multiple Vitamin (MULTIVITAMIN WITH MINERALS) TABS tablet Take 1 tablet by mouth daily.    . rivaroxaban (XARELTO) 20 MG TABS tablet Take 1 tablet (20 mg total) by mouth daily with supper. 30 tablet 11  . acetaminophen (TYLENOL) 500 MG tablet Take 500 mg by mouth every 6 (six) hours as needed for mild pain.    . bisacodyl (DULCOLAX) 5 MG EC tablet Take 5 mg by mouth daily as needed for moderate constipation.    Marland Kitchen FOLIC ACID PO Take 1 tablet by mouth daily.    . polyethylene glycol (MIRALAX / GLYCOLAX) packet Take 17 g 2 (two) times daily by mouth. (Patient not taking: Reported on 01/02/2018) 30 each 0  . TRIAMCINOLONE ACETONIDE EX Apply 1 application topically daily as needed (rash).     No facility-administered medications prior to visit.      ROS Review of Systems  Constitutional: Negative for chills, fever and malaise/fatigue.  Eyes: Negative for blurred vision.  Respiratory: Negative for shortness of breath.   Cardiovascular: Negative for chest pain and palpitations.  Gastrointestinal: Negative for abdominal pain and nausea.  Genitourinary: Negative for dysuria and hematuria.  Musculoskeletal:  Negative for joint pain and myalgias.  Skin: Negative for rash.  Neurological: Negative for tingling and headaches.  Psychiatric/Behavioral: Negative for depression. The patient is not nervous/anxious.     Objective:  Wt 131 lb (59.4 kg)   BMI 20.52 kg/m   Vitals:   01/02/18 1118  BP: 133/74  Pulse: 74  Temp: (!) 97.4 F (36.3 C)  SpO2: 97%     Physical Exam  Constitutional: He is oriented to person, place, and time.  Elderly, frail, NAD, polite  HENT:  Head: Normocephalic and atraumatic.  Eyes: No scleral icterus.  Neck: Normal range of motion. Neck supple. No thyromegaly present.  Cardiovascular: Normal rate, regular rhythm and normal heart sounds.  Pulmonary/Chest: Effort normal and breath sounds normal.  Musculoskeletal: He exhibits no edema.  Neurological: He is alert and oriented to person, place, and time. No cranial nerve deficit.  Skin: Skin is warm and dry. No rash noted. No erythema. No pallor.  Psychiatric: He has a normal mood and affect. His behavior is normal. Thought content normal.  Vitals reviewed.    Assessment & Plan:     1. Hypertension, unspecified type - Thyroid Panel With TSH - Comprehensive metabolic panel  2. May-Thurner syndrome - Sent referral to vascular at [facility] to evaluate treatment options for pt's May-Thurner Syndrome. Pt has not seen them yet and I have  - apixaban (ELIQUIS) 5 MG TABS  tablet; Take 1 tablet (5 mg total) by mouth 2 (two) times daily.  Dispense: 60 tablet; Refill: 11  3. Hypothermia, initial encounter - Thyroid Panel With TSH   Meds ordered this encounter  Medications  . apixaban (ELIQUIS) 5 MG TABS tablet    Sig: Take 1 tablet (5 mg total) by mouth 2 (two) times daily.    Dispense:  60 tablet    Refill:  11    Order Specific Question:   Supervising Provider    Answer:   Quentin AngstJEGEDE, OLUGBEMIGA E [[id number]]    Follow-up: Return in about 4 months (around 05/04/2018) for HTN.   Loletta Specteroger David Torrey Horseman  PA

## 2018-01-03 ENCOUNTER — Telehealth (INDEPENDENT_AMBULATORY_CARE_PROVIDER_SITE_OTHER): Payer: Self-pay

## 2018-01-03 LAB — COMPREHENSIVE METABOLIC PANEL
A/G RATIO: 1.8 (ref 1.2–2.2)
ALT: 14 IU/L (ref 0–44)
AST: 25 IU/L (ref 0–40)
Albumin: 4.3 g/dL (ref 3.5–4.8)
Alkaline Phosphatase: 105 IU/L (ref 39–117)
BUN/Creatinine Ratio: 10 (ref 10–24)
BUN: 9 mg/dL (ref 8–27)
Bilirubin Total: 1.2 mg/dL (ref 0.0–1.2)
CALCIUM: 9.7 mg/dL (ref 8.6–10.2)
CO2: 23 mmol/L (ref 20–29)
CREATININE: 0.94 mg/dL (ref 0.76–1.27)
Chloride: 103 mmol/L (ref 96–106)
GFR, EST AFRICAN AMERICAN: 89 mL/min/{1.73_m2} (ref >59)
GFR, EST NON AFRICAN AMERICAN: 77 mL/min/{1.73_m2} (ref >59)
GLOBULIN, TOTAL: 2.4 g/dL (ref 1.5–4.5)
Glucose: 66 mg/dL (ref 65–99)
Potassium: 5.1 mmol/L (ref 3.5–5.2)
SODIUM: 143 mmol/L (ref 134–144)
TOTAL PROTEIN: 6.7 g/dL (ref 6.0–8.5)

## 2018-01-03 LAB — THYROID PANEL WITH TSH
Free Thyroxine Index: 2 (ref 1.2–4.9)
T3 Uptake Ratio: 27 % (ref 24–39)
T4, Total: 7.3 ug/dL (ref 4.5–12.0)
TSH: 4.04 u[IU]/mL (ref 0.450–4.500)

## 2018-01-03 NOTE — Telephone Encounter (Signed)
-----   Message from Loletta Specteroger David Gomez, PA-C sent at 01/03/2018  1:39 PM EST ----- Normal labs.

## 2018-01-03 NOTE — Telephone Encounter (Signed)
Patient is aware of normal lab results. Derek Bentley S [name], CMA  ?

## 2018-01-10 MED FILL — XARELTO 20 MG TABLET: 20 | 30 days supply | Qty: 30 | Fill #4

## 2018-02-01 ENCOUNTER — Encounter (INDEPENDENT_AMBULATORY_CARE_PROVIDER_SITE_OTHER): Payer: Self-pay | Admitting: Physician Assistant

## 2018-02-01 ENCOUNTER — Other Ambulatory Visit: Payer: Self-pay

## 2018-02-01 ENCOUNTER — Ambulatory Visit (INDEPENDENT_AMBULATORY_CARE_PROVIDER_SITE_OTHER): Payer: Medicare Other | Admitting: Physician Assistant

## 2018-02-01 VITALS — BP 128/76 | HR 72 | Temp 97.4°F | Wt 133.4 lb

## 2018-02-01 DIAGNOSIS — R3911 Hesitancy of micturition: Secondary | ICD-10-CM

## 2018-02-01 DIAGNOSIS — M5441 Lumbago with sciatica, right side: Secondary | ICD-10-CM

## 2018-02-01 DIAGNOSIS — R351 Nocturia: Secondary | ICD-10-CM | POA: Diagnosis not present

## 2018-02-01 DIAGNOSIS — I10 Essential (primary) hypertension: Secondary | ICD-10-CM | POA: Diagnosis not present

## 2018-02-01 DIAGNOSIS — G8929 Other chronic pain: Secondary | ICD-10-CM

## 2018-02-01 DIAGNOSIS — Z9229 Personal history of other drug therapy: Secondary | ICD-10-CM

## 2018-02-01 MED ORDER — GABAPENTIN 100 MG PO CAPS: 100.0000 mg | ORAL_CAPSULE | Freq: Three times a day (TID) | ORAL | 3 refills | Status: DC

## 2018-02-01 MED FILL — GABAPENTIN 100 MG CAPSULE: 100 | 30 days supply | Qty: 90 | Fill #0

## 2018-02-01 MED FILL — $ELIQUIS 5 MG TABLET: 5 | 30 days supply | Qty: 60 | Fill #0

## 2018-02-01 NOTE — Progress Notes (Signed)
Subjective:  Patient ID: Derek Bentley, male    DOB: 11/03/1935  Age: 82 y.o. MRN: [id number]  CC: f/u HTN  HPI Derek NightJulius G Tysonis a 82 y.o.malewith a PMH of May-Thurner syndrome, DVT, BPH, SBO, paroxysmal Afib, diverticulitis, eczema, and spinal stenosis presentsto f/u on HTN and DVT. Went to vascular specialist and was advised to discontinue Eliquis as patient seemed to be mostly noncompliant, only taking Eliquis 2-3 times per week over the past 9 months. Vascular recommended Aspirin 325 mg BID for DVT prophylaxis and advised to monitor for signs and symptoms of recurrent DVT.Pt did not fully understand vascular specialists advice and has no idea why his anticoagulant was stopped. Patient admits to his noncompliance but is still worried a DVT may occur. Denies LE edema, erythema, pain, increased warmth, or f/c/n/v.     Patient concerned about chronic LBP with right sided sciatica. Says sciatica radiates to right popliteal region. He can not walk due to pain and feels his legs have become weak due to relying on a wheelchair for most of his ambulation. Says previous providers aware of this issue but have not treated him.     Concerned about nocturia and urinary hesitancy. No night sweats, no unintentional weight loss.    Outpatient Medications Prior to Visit  Medication Sig Dispense Refill  . acetaminophen (TYLENOL) 500 MG tablet Take 500 mg by mouth every 6 (six) hours as needed for mild pain.    Marland Kitchen. apixaban (ELIQUIS) 5 MG TABS tablet Take 1 tablet (5 mg total) by mouth 2 (two) times daily. 60 tablet 11  . ascorbic acid (VITAMIN C) 500 MG tablet Take 500 mg by mouth daily.    Marland Kitchen. b complex vitamins tablet Take 1 tablet by mouth daily.    . bisacodyl (DULCOLAX) 5 MG EC tablet Take 5 mg by mouth daily as needed for moderate constipation.    . calcium-vitamin D (OSCAL WITH D) 500-200 MG-UNIT per tablet Take 1 tablet by mouth daily with breakfast.    . Cyanocobalamin (VITAMIN B 12 PO) Take 1 tablet  by mouth daily.    Marland Kitchen. FOLIC ACID PO Take 1 tablet by mouth daily.    . Multiple Vitamin (MULTIVITAMIN WITH MINERALS) TABS tablet Take 1 tablet by mouth daily.    . TRIAMCINOLONE ACETONIDE EX Apply 1 application topically daily as needed (rash).     No facility-administered medications prior to visit.      ROS Review of Systems  Constitutional: Negative for chills, fever and malaise/fatigue.  Eyes: Negative for blurred vision.  Respiratory: Negative for shortness of breath.   Cardiovascular: Negative for chest pain and palpitations.  Gastrointestinal: Negative for abdominal pain and nausea.  Genitourinary: Negative for dysuria and hematuria.       Hesitancy and nocturia  Musculoskeletal: Positive for back pain. Negative for joint pain and myalgias.  Skin: Negative for rash.  Neurological: Negative for tingling and headaches.       Radiculopathy  Psychiatric/Behavioral: Negative for depression. The patient is not nervous/anxious.     Objective:   Vitals:   02/01/18 1203  BP: 128/76  Pulse: 72  Temp: (!) 97.4 F (36.3 C)  SpO2: 99%    Physical Exam  Constitutional: He is oriented to person, place, and time.  Elderly, frail, NAD, polite, sitting in wheelchair  HENT:  Head: Normocephalic and atraumatic.  Eyes: Conjunctivae are normal. No scleral icterus.  Neck: Normal range of motion. Neck supple. No thyromegaly present.  Cardiovascular: Normal rate,  regular rhythm, normal heart sounds and intact distal pulses.  No LE edema, tenderness, erythema, or increased warmth.  Pulmonary/Chest: Effort normal and breath sounds normal.  Abdominal: Soft. Bowel sounds are normal. There is no tenderness.  Musculoskeletal: He exhibits no edema.  Neurological: He is alert and oriented to person, place, and time.  Skin: Skin is warm and dry. No rash noted. No erythema. No pallor.  Psychiatric: He has a normal mood and affect. His behavior is normal. Thought content normal.  Vitals  reviewed.    Assessment & Plan:    1. History of anticoagulant use - Vascular specialist has stopped use of Eliquis. See HPI.  2. Hypertension, unspecified type - Well controlled without anti-hypertensives at this time.   3. Chronic right-sided low back pain with right-sided sciatica - Ambulatory referral to Physical Therapy - Ambulatory referral to Neurology - Begin gabapentin (NEURONTIN) 100 MG capsule; Take 1 capsule (100 mg total) by mouth 3 (three) times daily.  Dispense: 90 capsule; Refill: 3  4. Nocturia - PSA  5. Urinary hesitancy - PSA   Meds ordered this encounter  Medications  . gabapentin (NEURONTIN) 100 MG capsule    Sig: Take 1 capsule (100 mg total) by mouth 3 (three) times daily.    Dispense:  90 capsule    Refill:  3    Order Specific Question:   Supervising Provider    Answer:   Quentin Angst [id number]    Follow-up: Return in about 4 months (around 06/03/2018) for HTN.   Loletta Specter PA

## 2018-02-01 NOTE — Progress Notes (Signed)
foll

## 2018-02-01 NOTE — Patient Instructions (Signed)
Managing Your Hypertension Hypertension is commonly called high blood pressure. This is when the force of your blood pressing against the walls of your arteries is too strong. Arteries are blood vessels that carry blood from your heart throughout your body. Hypertension forces the heart to work harder to pump blood, and may cause the arteries to become narrow or stiff. Having untreated or uncontrolled hypertension can cause heart attack, stroke, kidney disease, and other problems. What are blood pressure readings? A blood pressure reading consists of a higher number over a lower number. Ideally, your blood pressure should be below 120/80. The first ("top") number is called the systolic pressure. It is a measure of the pressure in your arteries as your heart beats. The second ("bottom") number is called the diastolic pressure. It is a measure of the pressure in your arteries as the heart relaxes. What does my blood pressure reading mean? Blood pressure is classified into four stages. Based on your blood pressure reading, your health care provider may use the following stages to determine what type of treatment you need, if any. Systolic pressure and diastolic pressure are measured in a unit called mm Hg. Normal  Systolic pressure: below 120.  Diastolic pressure: below 80. Elevated  Systolic pressure: 120-129.  Diastolic pressure: below 80. Hypertension stage 1  Systolic pressure: 130-139.  Diastolic pressure: 80-89. Hypertension stage 2  Systolic pressure: 140 or above.  Diastolic pressure: 90 or above. What health risks are associated with hypertension? Managing your hypertension is an important responsibility. Uncontrolled hypertension can lead to:  A heart attack.  A stroke.  A weakened blood vessel (aneurysm).  Heart failure.  Kidney damage.  Eye damage.  Metabolic syndrome.  Memory and concentration problems.  What changes can I make to manage my  hypertension? Hypertension can be managed by making lifestyle changes and possibly by taking medicines. Your health care provider will help you make a plan to bring your blood pressure within a normal range. Eating and drinking  Eat a diet that is high in fiber and potassium, and low in salt (sodium), added sugar, and fat. An example eating plan is called the DASH (Dietary Approaches to Stop Hypertension) diet. To eat this way: ? Eat plenty of fresh fruits and vegetables. Try to fill half of your plate at each meal with fruits and vegetables. ? Eat whole grains, such as whole wheat pasta, brown rice, or whole grain bread. Fill about one quarter of your plate with whole grains. ? Eat low-fat diary products. ? Avoid fatty cuts of meat, processed or cured meats, and poultry with skin. Fill about one quarter of your plate with lean proteins such as fish, chicken without skin, beans, eggs, and tofu. ? Avoid premade and processed foods. These tend to be higher in sodium, added sugar, and fat.  Reduce your daily sodium intake. Most people with hypertension should eat less than 1,500 mg of sodium a day.  Limit alcohol intake to no more than 1 drink a day for nonpregnant women and 2 drinks a day for men. One drink equals 12 oz of beer, 5 oz of wine, or 1 oz of hard liquor. Lifestyle  Work with your health care provider to maintain a healthy body weight, or to lose weight. Ask what an ideal weight is for you.  Get at least 30 minutes of exercise that causes your heart to beat faster (aerobic exercise) most days of the week. Activities may include walking, swimming, or biking.  Include exercise   to strengthen your muscles (resistance exercise), such as weight lifting, as part of your weekly exercise routine. Try to do these types of exercises for 30 minutes at least 3 days a week.  Do not use any products that contain nicotine or tobacco, such as cigarettes and e-cigarettes. If you need help quitting, ask  your health care provider.  Control any long-term (chronic) conditions you have, such as high cholesterol or diabetes. Monitoring  Monitor your blood pressure at home as told by your health care provider. Your personal target blood pressure may vary depending on your medical conditions, your age, and other factors.  Have your blood pressure checked regularly, as often as told by your health care provider. Working with your health care provider  Review all the medicines you take with your health care provider because there may be side effects or interactions.  Talk with your health care provider about your diet, exercise habits, and other lifestyle factors that may be contributing to hypertension.  Visit your health care provider regularly. Your health care provider can help you create and adjust your plan for managing hypertension. Will I need medicine to control my blood pressure? Your health care provider may prescribe medicine if lifestyle changes are not enough to get your blood pressure under control, and if:  Your systolic blood pressure is 130 or higher.  Your diastolic blood pressure is 80 or higher.  Take medicines only as told by your health care provider. Follow the directions carefully. Blood pressure medicines must be taken as prescribed. The medicine does not work as well when you skip doses. Skipping doses also puts you at risk for problems. Contact a health care provider if:  You think you are having a reaction to medicines you have taken.  You have repeated (recurrent) headaches.  You feel dizzy.  You have swelling in your ankles.  You have trouble with your vision. Get help right away if:  You develop a severe headache or confusion.  You have unusual weakness or numbness, or you feel faint.  You have severe pain in your chest or abdomen.  You vomit repeatedly.  You have trouble breathing. Summary  Hypertension is when the force of blood pumping through  your arteries is too strong. If this condition is not controlled, it may put you at risk for serious complications.  Your personal target blood pressure may vary depending on your medical conditions, your age, and other factors. For most people, a normal blood pressure is less than 120/80.  Hypertension is managed by lifestyle changes, medicines, or both. Lifestyle changes include weight loss, eating a healthy, low-sodium diet, exercising more, and limiting alcohol. This information is not intended to replace advice given to you by your health care provider. Make sure you discuss any questions you have with your health care provider. Document Released: 07/11/2012 Document Revised: 09/14/2016 Document Reviewed: 09/14/2016 Elsevier Interactive Patient Education  2018 [employer]  

## 2018-02-02 ENCOUNTER — Telehealth (INDEPENDENT_AMBULATORY_CARE_PROVIDER_SITE_OTHER): Payer: Self-pay

## 2018-02-02 LAB — PSA: Prostate Specific Ag, Serum: 2.7 ng/mL (ref 0.0–4.0)

## 2018-02-02 NOTE — Telephone Encounter (Signed)
-----   Message from Loletta Specteroger David Gomez, PA-C sent at 02/02/2018 12:47 PM EDT ----- Normal PSA.

## 2018-02-02 NOTE — Telephone Encounter (Signed)
Patient aware of normal PSA. Derek Bentley S Derek Bentley, CMA  

## 2018-03-01 MED FILL — $ELIQUIS 5 MG TABLET: 5 | 60 days supply | Qty: 120 | Fill #1

## 2018-03-01 MED FILL — GABAPENTIN 100 MG CAPSULE: 100 | 30 days supply | Qty: 90 | Fill #1

## 2018-04-19 MED FILL — GABAPENTIN 100 MG CAPSULE: 100 | 30 days supply | Qty: 90 | Fill #2

## 2018-04-19 MED FILL — $ELIQUIS 5 MG TABLET: 5 | 90 days supply | Qty: 180 | Fill #2

## 2018-05-17 MED FILL — GABAPENTIN 100 MG CAPSULE: 100 | 30 days supply | Qty: 90 | Fill #3

## 2018-06-05 ENCOUNTER — Ambulatory Visit (INDEPENDENT_AMBULATORY_CARE_PROVIDER_SITE_OTHER): Payer: Medicare Other | Admitting: Physician Assistant

## 2018-06-05 ENCOUNTER — Other Ambulatory Visit: Payer: Self-pay

## 2018-06-05 ENCOUNTER — Encounter (INDEPENDENT_AMBULATORY_CARE_PROVIDER_SITE_OTHER): Payer: Self-pay | Admitting: Physician Assistant

## 2018-06-05 VITALS — BP 137/75 | HR 72 | Temp 97.7°F | Ht 67.0 in | Wt 135.8 lb

## 2018-06-05 DIAGNOSIS — M5441 Lumbago with sciatica, right side: Secondary | ICD-10-CM

## 2018-06-05 DIAGNOSIS — Z79899 Other long term (current) drug therapy: Secondary | ICD-10-CM | POA: Diagnosis not present

## 2018-06-05 DIAGNOSIS — G8929 Other chronic pain: Secondary | ICD-10-CM | POA: Diagnosis not present

## 2018-06-05 DIAGNOSIS — I871 Compression of vein: Secondary | ICD-10-CM

## 2018-06-05 MED ORDER — GABAPENTIN 100 MG PO CAPS: 100.0000 mg | ORAL_CAPSULE | Freq: Three times a day (TID) | ORAL | 3 refills | Status: DC

## 2018-06-05 MED ORDER — APIXABAN 5 MG PO TABS: 5.0000 mg | ORAL_TABLET | Freq: Two times a day (BID) | ORAL | 3 refills | Status: DC

## 2018-06-05 NOTE — Progress Notes (Signed)
Subjective:  Patient ID: Derek Bentley, male    DOB: 11/02/1935  Age: 82 y.o. MRN: [id number]  CC: HTN f/u  HPI Derek NightJulius G Tysonis a 82 y.o.malewith a PMH ofMay-Thurner syndrome, DVT,BPH, SBO, paroxysmal Afib, diverticulitis, eczema, and spinal stenosis s/p decompression and microdiscectomy 07/2015 presentsto f/u onHTN and DVT. Went to vascular specialist 01/10/18 and was advised to discontinue Eliquis as patient seemed to be mostly noncompliant, only taking Eliquis 2-3 times per week over the past 9 months. Vascular recommended Aspirin 325 mg BID for DVT prophylaxis and advised to monitor for signs and symptoms of recurrent DVT. Pt did not fully understand vascular specialists advice and has no idea why his anticoagulant was stopped. Patient admits to his noncompliance but is still worried a DVT may occur. Has been taking Eliquis 5 mg BID since his vascular appointment and feels he has done well in regards to original DVT symptoms. Denies LE edema, erythema, pain, increased warmth, or f/c/n/v.      Patient concerned about chronic LBP with right sided sciatica. Says sciatica radiates to right popliteal region. He can not walk due to pain and feels his legs have become weak due to relying on a wheelchair for most of his ambulation. Says previous providers aware of this issue but have not treated him. Was referred to neurology and physical therapy but pt declined because he thought it would be a waste of time. Does not want to go physical therapy or neurology because "I am 82 years old" and does not want to go through the hassle of medical treatments that would not work. Says surgeon that performed decompression and microdiscectomy told him that he would be 80-90% better but instead he was worse and has not taken a step on his own since the surgery. Uses walker and wheelchair for ambulation. Pt would rather continue to take gabapentin which was useful in reducing his back pain.          Outpatient  Medications Prior to Visit  Medication Sig Dispense Refill  . acetaminophen (TYLENOL) 500 MG tablet Take 500 mg by mouth every 6 (six) hours as needed for mild pain.    Marland Kitchen. ascorbic acid (VITAMIN C) 500 MG tablet Take 500 mg by mouth daily.    Marland Kitchen. b complex vitamins tablet Take 1 tablet by mouth daily.    . bisacodyl (DULCOLAX) 5 MG EC tablet Take 5 mg by mouth daily as needed for moderate constipation.    . calcium-vitamin D (OSCAL WITH D) 500-200 MG-UNIT per tablet Take 1 tablet by mouth daily with breakfast.    . Cyanocobalamin (VITAMIN B 12 PO) Take 1 tablet by mouth daily.    Marland Kitchen. FOLIC ACID PO Take 1 tablet by mouth daily.    Marland Kitchen. gabapentin (NEURONTIN) 100 MG capsule Take 1 capsule (100 mg total) by mouth 3 (three) times daily. 90 capsule 3  . Multiple Vitamin (MULTIVITAMIN WITH MINERALS) TABS tablet Take 1 tablet by mouth daily.    . TRIAMCINOLONE ACETONIDE EX Apply 1 application topically daily as needed (rash).     No facility-administered medications prior to visit.      ROS Review of Systems  Constitutional: Negative for chills, fever and malaise/fatigue.  Eyes: Negative for blurred vision.  Respiratory: Negative for shortness of breath.   Cardiovascular: Negative for chest pain and palpitations.  Gastrointestinal: Negative for abdominal pain and nausea.  Genitourinary: Negative for dysuria and hematuria.  Musculoskeletal: Positive for back pain. Negative for joint pain and  myalgias.  Skin: Negative for rash.  Neurological: Negative for tingling and headaches.  Psychiatric/Behavioral: Negative for depression. The patient is not nervous/anxious.     Objective:  BP 137/75 (BP Location: Right Arm, Patient Position: Sitting, Cuff Size: Small)   Pulse 72   Temp 97.7 F (36.5 C) (Oral)   Ht 5\' 7"  (1.702 m)   Wt 135 lb 12.8 oz (61.6 kg)   SpO2 99%   BMI 21.27 kg/m   BP/Weight 06/05/2018 02/01/2018 01/02/2018  Systolic BP 137 128 133  Diastolic BP 75 76 74  Wt. (Lbs) 135.8 133.4 131   BMI 21.27 20.89 20.52      Physical Exam  Constitutional: He is oriented to person, place, and time.  Elderly, frail, NAD, polite, somewhat hard of hearing, sitting in wheelchair.  HENT:  Head: Normocephalic and atraumatic.  Eyes: No scleral icterus.  Neck: Normal range of motion. Neck supple. No thyromegaly present.  Cardiovascular: Normal rate, regular rhythm and normal heart sounds.  No LE edema bilaterally. No carotid bruit bilaterally.  Pulmonary/Chest: Effort normal and breath sounds normal.  Musculoskeletal: He exhibits no edema.  Neurological: He is alert and oriented to person, place, and time.  Skin: Skin is warm and dry. No rash noted. No erythema. No pallor.  Psychiatric: He has a normal mood and affect. His behavior is normal. Thought content normal.  Vitals reviewed.    Assessment & Plan:    1. Chronic right-sided low back pain with right-sided sciatica - Refill gabapentin (NEURONTIN) 100 MG capsule; Take 1 capsule (100 mg total) by mouth 3 (three) times daily.  Dispense: 90 capsule; Refill: 3  2. May-Thurner syndrome - Pt denied referral by Dr. Carmel [facility] and seen by Dr. Zella Ball at [facility] on 01/10/18 which advised he stop Eliquis and Xarelto based on noncompliance. Pt was further advised to call PCP if he experiences signs of a clot. Pt has thus far not adhered to Dr. Gar Gibbon advise and has continued to take Eliquis 5mg  BID. Pt wishes to continue Eliquis. I will keep patient on Eliquis and refer to Vascular for consult on discontinuation of Eliquis in the setting of May Thurner syndrome.  - Refill apixaban (ELIQUIS) 5 MG TABS tablet; Take 1 tablet (5 mg total) by mouth 2 (two) times daily.  Dispense: 60 tablet; Refill: 3 - Referral to [facility].   3. High risk medication use - Basic Metabolic Panel   Meds ordered this encounter  Medications  . apixaban (ELIQUIS) 5 MG TABS tablet    Sig: Take 1 tablet (5 mg total) by mouth 2  (two) times daily.    Dispense:  60 tablet    Refill:  3    Order Specific Question:   Supervising Provider    Answer:   Hoy Register [[id number]]  . gabapentin (NEURONTIN) 100 MG capsule    Sig: Take 1 capsule (100 mg total) by mouth 3 (three) times daily.    Dispense:  90 capsule    Refill:  3    Order Specific Question:   Supervising Provider    Answer:   Hoy Register [[id number]]    Follow-up: Return in about 6 months (around 12/06/2018) for HTN.   Loletta Specter PA

## 2018-06-05 NOTE — Patient Instructions (Signed)
Apixaban oral tablets °What is this medicine? °APIXABAN (a PIX a ban) is an anticoagulant (blood thinner). It is used to lower the chance of stroke in people with a medical condition called atrial fibrillation. It is also used to treat or prevent blood clots in the lungs or in the veins. °This medicine may be used for other purposes; ask your health care provider or pharmacist if you have questions. °COMMON BRAND NAME(S): Eliquis °What should I tell my health care provider before I take this medicine? °They need to know if you have any of these conditions: °-bleeding disorders °-bleeding in the brain °-blood in your stools (black or tarry stools) or if you have blood in your vomit °-history of stomach bleeding °-kidney disease °-liver disease °-mechanical heart valve °-an unusual or allergic reaction to apixaban, other medicines, foods, dyes, or preservatives °-pregnant or trying to get pregnant °-breast-feeding °How should I use this medicine? °Take this medicine by mouth with a glass of water. Follow the directions on the prescription label. You can take it with or without food. If it upsets your stomach, take it with food. Take your medicine at regular intervals. Do not take it more often than directed. Do not stop taking except on your doctor's advice. Stopping this medicine may increase your risk of a blot clot. Be sure to refill your prescription before you run out of medicine. °Talk to your pediatrician regarding the use of this medicine in children. Special care may be needed. °Overdosage: If you think you have taken too much of this medicine contact a poison control center or emergency room at once. °NOTE: This medicine is only for you. Do not share this medicine with others. °What if I miss a dose? °If you miss a dose, take it as soon as you can. If it is almost time for your next dose, take only that dose. Do not take double or extra doses. °What may interact with this medicine? °This medicine may  interact with the following: °-aspirin and aspirin-like medicines °-certain medicines for fungal infections like ketoconazole and itraconazole °-certain medicines for seizures like carbamazepine and phenytoin °-certain medicines that treat or prevent blood clots like warfarin, enoxaparin, and dalteparin °-clarithromycin °-NSAIDs, medicines for pain and inflammation, like ibuprofen or naproxen °-rifampin °-ritonavir °-St. John's wort °This list may not describe all possible interactions. Give your health care provider a list of all the medicines, herbs, non-prescription drugs, or dietary supplements you use. Also tell them if you smoke, drink alcohol, or use illegal drugs. Some items may interact with your medicine. °What should I watch for while using this medicine? °Visit your doctor or health care professional for regular checks on your progress. °Notify your doctor or health care professional and seek emergency treatment if you develop breathing problems; changes in vision; chest pain; severe, sudden headache; pain, swelling, warmth in the leg; trouble speaking; sudden numbness or weakness of the face, arm or leg. These can be signs that your condition has gotten worse. °If you are going to have surgery or other procedure, tell your doctor that you are taking this medicine. °What side effects may I notice from receiving this medicine? °Side effects that you should report to your doctor or health care professional as soon as possible: °-allergic reactions like skin rash, itching or hives, swelling of the face, lips, or tongue °-signs and symptoms of bleeding such as bloody or black, tarry stools; red or dark-brown urine; spitting up blood or brown material that looks like coffee   grounds; red spots on the skin; unusual bruising or bleeding from the eye, gums, or nose °This list may not describe all possible side effects. Call your doctor for medical advice about side effects. You may report side effects to FDA at  1-800-FDA-1088. °Where should I keep my medicine? °Keep out of the reach of children. °Store at room temperature between 20 and 25 degrees C (68 and 77 degrees F). Throw away any unused medicine after the expiration date. °NOTE: This sheet is a summary. It may not cover all possible information. If you have questions about this medicine, talk to your doctor, pharmacist, or health care provider. °© 2018 [employer]vier/Gold Standard (2016-05-09 11:54:23) °  ° °

## 2018-06-06 LAB — BASIC METABOLIC PANEL
BUN/Creatinine Ratio: 8 — ABNORMAL LOW (ref 10–24)
BUN: 9 mg/dL (ref 8–27)
CALCIUM: 9.7 mg/dL (ref 8.6–10.2)
CO2: 23 mmol/L (ref 20–29)
CREATININE: 1.11 mg/dL (ref 0.76–1.27)
Chloride: 106 mmol/L (ref 96–106)
GFR, EST AFRICAN AMERICAN: 73 mL/min/{1.73_m2} (ref >59)
GFR, EST NON AFRICAN AMERICAN: 63 mL/min/{1.73_m2} (ref >59)
Glucose: 77 mg/dL (ref 65–99)
POTASSIUM: 4.6 mmol/L (ref 3.5–5.2)
SODIUM: 143 mmol/L (ref 134–144)

## 2018-06-08 ENCOUNTER — Telehealth (INDEPENDENT_AMBULATORY_CARE_PROVIDER_SITE_OTHER): Payer: Self-pay

## 2018-06-08 NOTE — Telephone Encounter (Signed)
Patient is aware of normal kidney result. Derek Bentley, CMA

## 2018-06-08 NOTE — Telephone Encounter (Signed)
-----   Message from Loletta Specteroger David Gomez, PA-C sent at 06/06/2018  1:40 PM EDT ----- Kidney results normal.

## 2018-08-07 ENCOUNTER — Encounter: Payer: Medicare Other | Admitting: Vascular Surgery

## 2018-08-07 ENCOUNTER — Encounter (HOSPITAL_COMMUNITY): Payer: Medicare Other

## 2018-08-22 MED FILL — $ELIQUIS 5 MG TABLET: 5 | 90 days supply | Qty: 180 | Fill #0

## 2018-08-22 MED FILL — GABAPENTIN 100 MG CAPSULE: 100 | 30 days supply | Qty: 90 | Fill #0

## 2018-09-20 ENCOUNTER — Other Ambulatory Visit (INDEPENDENT_AMBULATORY_CARE_PROVIDER_SITE_OTHER): Payer: Self-pay | Admitting: Physician Assistant

## 2018-09-20 DIAGNOSIS — M5441 Lumbago with sciatica, right side: Secondary | ICD-10-CM

## 2018-09-20 DIAGNOSIS — R5381 Other malaise: Secondary | ICD-10-CM

## 2018-09-20 DIAGNOSIS — G8929 Other chronic pain: Secondary | ICD-10-CM

## 2018-10-15 MED FILL — GABAPENTIN 100 MG CAPSULE: 100 | 30 days supply | Qty: 90 | Fill #1

## 2018-11-08 MED FILL — GABAPENTIN 100 MG CAP: 100 | 30 days supply | Qty: 90 | Fill #2

## 2018-11-15 MED FILL — $ELIQUIS 5 MG TABLET: 5 | 30 days supply | Qty: 60 | Fill #1

## 2018-12-04 ENCOUNTER — Other Ambulatory Visit: Payer: Self-pay

## 2018-12-04 ENCOUNTER — Encounter (INDEPENDENT_AMBULATORY_CARE_PROVIDER_SITE_OTHER): Payer: Self-pay | Admitting: Family Medicine

## 2018-12-04 ENCOUNTER — Ambulatory Visit (INDEPENDENT_AMBULATORY_CARE_PROVIDER_SITE_OTHER): Payer: Medicare Other | Admitting: Family Medicine

## 2018-12-04 VITALS — BP 135/85 | HR 86 | Temp 97.3°F | Ht 67.0 in | Wt 135.6 lb

## 2018-12-04 DIAGNOSIS — M5441 Lumbago with sciatica, right side: Secondary | ICD-10-CM

## 2018-12-04 DIAGNOSIS — G8929 Other chronic pain: Secondary | ICD-10-CM

## 2018-12-04 DIAGNOSIS — Z7901 Long term (current) use of anticoagulants: Secondary | ICD-10-CM

## 2018-12-04 DIAGNOSIS — I871 Compression of vein: Secondary | ICD-10-CM | POA: Diagnosis not present

## 2018-12-04 DIAGNOSIS — Z789 Other specified health status: Secondary | ICD-10-CM

## 2018-12-04 DIAGNOSIS — I825Y2 Chronic embolism and thrombosis of unspecified deep veins of left proximal lower extremity: Secondary | ICD-10-CM | POA: Diagnosis not present

## 2018-12-04 DIAGNOSIS — Z7409 Other reduced mobility: Secondary | ICD-10-CM

## 2018-12-04 MED ORDER — GABAPENTIN 100 MG PO CAPS: 100.0000 mg | ORAL_CAPSULE | Freq: Three times a day (TID) | ORAL | 3 refills | Status: DC

## 2018-12-04 MED ORDER — APIXABAN 5 MG PO TABS: 5.0000 mg | ORAL_TABLET | Freq: Two times a day (BID) | ORAL | 3 refills | Status: DC

## 2018-12-04 NOTE — Progress Notes (Signed)
Subjective:    Patient ID: Derek Bentley, male    DOB: 1936/02/06, 83 y.o.   MRN: [id number]  HPI       83 yo male seen in follow-up of chronic medical issues.  Patient is accompanied by his wife at today's visit.  Patient reports that he is pretty much been wheelchair-bound since he had spinal surgery.  He is being seen in follow-up of his hypertension, lower extremity DVT and use of anticoagulant medication.  Patient is concerned now about recurrent development of DVTs due to his wheelchair dependence/decreased mobility.  Patient reports that he has a medical condition which increases his risk of development of DVTs.  He reports no unusual bruising or bleeding with his current use of Eliquis.  He does continue to have chronic back pain that is always a least a 6 on a 0-to-10 scale.  He describes his back pain is dull and aching.  He also has radiation of pain down the right leg at times.  He reports that his blood pressure has actually been on the low side and he is no longer on medication for his blood pressure but continues to monitor his blood pressure periodically.  He denies any headaches or dizziness which he believes may be related to his blood pressure.  He feels that his health is fairly stable at this time.   Past Medical History:  Diagnosis Date  . Arthritis   . BPH (benign prostatic hyperplasia)   . Diverticulitis   . DVT (deep venous thrombosis) (HCC) 05/12/2017   LLE  . Eczema   . Ileus (HCC)   . Obstruction (acquired) of bladder neck or vesicourethral orifice   . PAF (paroxysmal atrial fibrillation) (HCC)   . SBO (small bowel obstruction) (HCC)   . Spinal stenosis    Past Surgical History:  Procedure Laterality Date  . BACK SURGERY    . CATARACT EXTRACTION W/ INTRAOCULAR LENS  IMPLANT, BILATERAL Bilateral   . CIRCUMCISION    . FRACTURE SURGERY    . HIP FRACTURE SURGERY Left   . INGUINAL HERNIA REPAIR Bilateral   . LUMBAR LAMINECTOMY/DECOMPRESSION MICRODISCECTOMY N/A  07/29/2015   Procedure: DECOMPRESSION L4-L5 and L3-L4,  MICRODISCECTOMY L4-L5     (2 LEVELS);  Surgeon: Jene Every, MD;  Location: [facility];  Service: Orthopedics;  Laterality: N/A;  . TONSILLECTOMY     Family History  Problem Relation Age of Onset  . Stroke Mother   . Heart attack Father   . Hypertension Other   . Heart disease Sister   . Deep vein thrombosis Neg Hx    Social History   Tobacco Use  . Smoking status: Never Smoker  . Smokeless tobacco: Never Used  Substance Use Topics  . Alcohol use: No  . Drug use: No   Allergies  Allergen Reactions  . Tramadol Other (See Comments)    Hiccups and constipation     Review of Systems  Constitutional: Positive for fatigue. Negative for chills and fever.  HENT: Negative for nosebleeds.   Respiratory: Negative for cough and shortness of breath.   Cardiovascular: Positive for leg swelling (Mild). Negative for chest pain and palpitations.  Gastrointestinal: Negative for abdominal pain, blood in stool, constipation, diarrhea and nausea.  Endocrine: Negative for polydipsia, polyphagia and polyuria.  Genitourinary: Negative for dysuria and frequency.  Musculoskeletal: Positive for arthralgias, back pain and gait problem.  Neurological: Negative for dizziness and headaches.  Hematological: Negative for adenopathy. Does not bruise/bleed easily.  Objective:   Physical Exam BP 135/85 (BP Location: Right Arm, Patient Position: Sitting, Cuff Size: Small)   Pulse 86   Temp (!) 97.3 F (36.3 C) (Oral)   Ht 5\' 7"  (1.702 m)   Wt 135 lb 9.6 oz (61.5 kg)   SpO2 97%   BMI 21.24 kg/m Nurse's notes and vital signs reviewed  General- well-nourished well-developed elderly male in no acute distress sitting in a wheelchair.  Patient is accompanied by his wife at today's visit Neck-supple, no lymphadenopathy, no carotid bruit Lungs-clear to auscultation bilaterally Cardiovascular-regular rate and rhythm Abdomen-soft,  nontender Extremities-patient with some mild, nonpitting distal lower extremity edema.  No reproducible calf tenderness.   Back-no CVA tenderness.  Patient with some mild to moderate thoracolumbar paraspinous spasm.  Patient with lumbar scoliosis.  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  Patient is currently on Eliquis after prior DVT and patient wonders if he can continue this medication.  Patient was provided with refills of Eliquis.  He denies any issues with unusual bruising or bleeding.  Patient has limited mobility secondary to chronic low back pain/spinal issues and therefore has a decreased risk of falls related to his use of anticoagulant medication but with his may Thurner syndrome and decreased mobility he is also at increased risk of DVT therefore he should likely remain on Eliquis. - apixaban (ELIQUIS) 5 MG TABS tablet; Take 1 tablet (5 mg total) by mouth 2 (two) times daily.  Dispense: 60 tablet; Refill: 3  2. Chronic right-sided low back pain with right-sided sciatica Patient with chronic back pain secondary to lumbar degenerative disc disease and scoliosis and patient with failed laminectomy syndrome.  Gabapentin is refilled at today's visit. - gabapentin (NEURONTIN) 100 MG capsule; Take 1 capsule (100 mg total) by mouth 3 (three) times daily.  Dispense: 90 capsule; Refill: 3  3. Chronic deep vein thrombosis (DVT) of proximal vein of left lower extremity (HCC) Patient will have CBC in follow-up of his use of anticoagulant medication and refill provided of Eliquis due to DVT of the left lower extremity - CBC with Differential - apixaban (ELIQUIS) 5 MG TABS tablet; Take 1 tablet (5 mg total) by mouth 2 (two) times daily.  Dispense: 60 tablet;  Refill: 3  4. Long term current use of anticoagulant CBC done in follow-up of patient's long-term use of anticoagulant medication - CBC with Differential  5. Impaired mobility and activities of daily living Patient with wheelchair dependence status post failed back surgery and chronic pain related to lumbar degenerative disc disease and scoliosis  An After Visit Summary was printed and given to the patient.  Allergies as of 12/04/2018      Reactions   Tramadol Other (See Comments)   Hiccups and constipation      Medication List       Accurate as of December 04, 2018 11:59 PM. Always use your most recent med list.        acetaminophen 500 MG tablet Commonly known as:  TYLENOL Take 500 mg by mouth every 6 (six) hours as needed for mild pain.   apixaban 5 MG Tabs tablet Commonly known as:  Eliquis Take 1 tablet (5 mg total) by mouth 2 (two) times daily.  ascorbic acid 500 MG tablet Commonly known as:  VITAMIN C Take 500 mg by mouth daily.   b complex vitamins tablet Take 1 tablet by mouth daily.   bisacodyl 5 MG EC tablet Commonly known as:  DULCOLAX Take 5 mg by mouth daily as needed for moderate constipation.   calcium-vitamin D 500-200 MG-UNIT tablet Commonly known as:  OSCAL WITH D Take 1 tablet by mouth daily with breakfast.   FOLIC ACID PO Take 1 tablet by mouth daily.   gabapentin 100 MG capsule Commonly known as:  NEURONTIN Take 1 capsule (100 mg total) by mouth 3 (three) times daily.   multivitamin with minerals Tabs tablet Take 1 tablet by mouth daily.   TRIAMCINOLONE ACETONIDE EX Apply 1 application topically daily as needed (rash).   VITAMIN B 12 PO Take 1 tablet by mouth daily.      Return in about 6 months (around 06/04/2019) for back pain and chronic issues.

## 2018-12-05 LAB — CBC WITH DIFFERENTIAL/PLATELET
Basophils Absolute: 0.1 x10E3/uL (ref 0.0–0.2)
Basos: 2 %
EOS (ABSOLUTE): 0.2 x10E3/uL (ref 0.0–0.4)
Eos: 4 %
Hematocrit: 41 % (ref 37.5–51.0)
Hemoglobin: 14.1 g/dL (ref 13.0–17.7)
Immature Grans (Abs): 0 x10E3/uL (ref 0.0–0.1)
Immature Granulocytes: 0 %
Lymphocytes Absolute: 1.7 x10E3/uL (ref 0.7–3.1)
Lymphs: 42 %
MCH: 33.7 pg — ABNORMAL HIGH (ref 26.6–33.0)
MCHC: 34.4 g/dL (ref 31.5–35.7)
MCV: 98 fL — ABNORMAL HIGH (ref 79–97)
Monocytes Absolute: 0.3 x10E3/uL (ref 0.1–0.9)
Monocytes: 8 %
Neutrophils Absolute: 1.8 x10E3/uL (ref 1.4–7.0)
Neutrophils: 44 %
Platelets: 183 x10E3/uL (ref 150–450)
RBC: 4.18 x10E6/uL (ref 4.14–5.80)
RDW: 12 % (ref 11.6–15.4)
WBC: 4.1 x10E3/uL (ref 3.4–10.8)

## 2018-12-11 ENCOUNTER — Telehealth (INDEPENDENT_AMBULATORY_CARE_PROVIDER_SITE_OTHER): Payer: Self-pay

## 2018-12-11 NOTE — Telephone Encounter (Signed)
-----   Message from Cain Saupe, MD sent at 12/08/2018  4:49 PM EST ----- Please notify patient of normal complete blood count, no anemia

## 2018-12-11 NOTE — Telephone Encounter (Signed)
Patient is aware that cbc is normal and that he is not anemic. Maryjean Morn, CMA

## 2019-01-02 MED FILL — GABAPENTIN 100 MG CAP: 100 | 30 days supply | Qty: 90 | Fill #0

## 2019-01-02 MED FILL — $ELIQUIS 5 MG TABLET: 5 | 30 days supply | Qty: 60 | Fill #0

## 2019-01-28 MED FILL — GABAPENTIN 100 MG CAP: 100 | 30 days supply | Qty: 90 | Fill #1

## 2019-01-28 MED FILL — $ELIQUIS 5 MG TABLET: 5 | 30 days supply | Qty: 60 | Fill #1

## 2019-02-26 MED FILL — GABAPENTIN 100 MG CAP: 100 | 30 days supply | Qty: 90 | Fill #2

## 2019-02-27 MED FILL — !ELIQUIS 5MG TABLET: 5 | 30 days supply | Qty: 60 | Fill #2

## 2019-03-25 ENCOUNTER — Encounter (INDEPENDENT_AMBULATORY_CARE_PROVIDER_SITE_OTHER): Payer: Self-pay | Admitting: Family Medicine

## 2019-04-03 MED FILL — !ELIQUIS 5MG TABLET: 5 | 30 days supply | Qty: 60 | Fill #3

## 2019-04-03 MED FILL — GABAPENTIN 100 MG CAP: 100 | 30 days supply | Qty: 90 | Fill #3

## 2019-05-06 MED FILL — GABAPENTIN 100 MG CAP: 100 | 30 days supply | Qty: 90 | Fill #3

## 2019-05-09 ENCOUNTER — Other Ambulatory Visit (INDEPENDENT_AMBULATORY_CARE_PROVIDER_SITE_OTHER): Payer: Self-pay | Admitting: Family Medicine

## 2019-05-09 DIAGNOSIS — I871 Compression of vein: Secondary | ICD-10-CM

## 2019-05-09 DIAGNOSIS — I825Y2 Chronic embolism and thrombosis of unspecified deep veins of left proximal lower extremity: Secondary | ICD-10-CM

## 2019-05-14 ENCOUNTER — Encounter: Payer: Self-pay | Admitting: Gastroenterology

## 2019-05-24 ENCOUNTER — Other Ambulatory Visit: Payer: Self-pay | Admitting: Pharmacist

## 2019-05-24 DIAGNOSIS — I825Y2 Chronic embolism and thrombosis of unspecified deep veins of left proximal lower extremity: Secondary | ICD-10-CM

## 2019-05-24 DIAGNOSIS — I871 Compression of vein: Secondary | ICD-10-CM

## 2019-05-24 MED ORDER — APIXABAN 5 MG PO TABS: 5.0000 mg | ORAL_TABLET | Freq: Two times a day (BID) | ORAL | 0 refills | Status: DC

## 2019-05-24 MED FILL — ELIQUIS 5 MG TABLET: 5 | 30 days supply | Qty: 60 | Fill #0

## 2019-06-04 ENCOUNTER — Encounter (INDEPENDENT_AMBULATORY_CARE_PROVIDER_SITE_OTHER): Payer: Self-pay | Admitting: Primary Care

## 2019-06-04 ENCOUNTER — Other Ambulatory Visit: Payer: Self-pay

## 2019-06-04 ENCOUNTER — Ambulatory Visit (INDEPENDENT_AMBULATORY_CARE_PROVIDER_SITE_OTHER): Payer: Medicare Other | Admitting: Primary Care

## 2019-06-04 VITALS — BP 132/78 | HR 97 | Temp 97.5°F | Ht 67.0 in | Wt 135.6 lb

## 2019-06-04 DIAGNOSIS — R2689 Other abnormalities of gait and mobility: Secondary | ICD-10-CM | POA: Diagnosis not present

## 2019-06-04 DIAGNOSIS — I825Y2 Chronic embolism and thrombosis of unspecified deep veins of left proximal lower extremity: Secondary | ICD-10-CM

## 2019-06-04 DIAGNOSIS — M5441 Lumbago with sciatica, right side: Secondary | ICD-10-CM | POA: Diagnosis not present

## 2019-06-04 DIAGNOSIS — I871 Compression of vein: Secondary | ICD-10-CM | POA: Diagnosis not present

## 2019-06-04 DIAGNOSIS — G8929 Other chronic pain: Secondary | ICD-10-CM

## 2019-06-04 MED ORDER — BISACODYL 5 MG PO TBEC: 5.0000 mg | DELAYED_RELEASE_TABLET | Freq: Every day | ORAL | 3 refills | Status: AC | PRN

## 2019-06-04 MED ORDER — APIXABAN 5 MG PO TABS: 5.0000 mg | ORAL_TABLET | Freq: Two times a day (BID) | ORAL | 3 refills | Status: AC

## 2019-06-04 MED ORDER — GABAPENTIN 100 MG PO CAPS: 100.0000 mg | ORAL_CAPSULE | Freq: Three times a day (TID) | ORAL | 3 refills | Status: AC

## 2019-06-04 MED FILL — GABAPENTIN 100 MG CAP: 100 | 30 days supply | Qty: 90 | Fill #0

## 2019-06-04 NOTE — Progress Notes (Signed)
Acute Office Visit  Subjective:    Patient ID: Derek Bentley, male    DOB: 08/11/1936, 83 y.o.   MRN: [id number]  Chief Complaint  Patient presents with  . Follow-up    back pain/chronic issues  . Medication Refill    gabapentin/eliquis     HPI Patient is in today for acute visit chronic back pain and medication refills. He presents in a wheel chair wife states hasn't been able to walk or stand for 5 years . He is dependent on transfers , wheel chair and walker in the house.   Past Medical History:  Diagnosis Date  . Arthritis   . BPH (benign prostatic hyperplasia)   . Diverticulitis   . DVT (deep venous thrombosis) (HCC) 05/12/2017   LLE  . Eczema   . Ileus (HCC)   . Obstruction (acquired) of bladder neck or vesicourethral orifice   . PAF (paroxysmal atrial fibrillation) (HCC)   . SBO (small bowel obstruction) (HCC)   . Spinal stenosis     Past Surgical History:  Procedure Laterality Date  . BACK SURGERY    . CATARACT EXTRACTION W/ INTRAOCULAR LENS  IMPLANT, BILATERAL Bilateral   . CIRCUMCISION    . FRACTURE SURGERY    . HIP FRACTURE SURGERY Left   . INGUINAL HERNIA REPAIR Bilateral   . LUMBAR LAMINECTOMY/DECOMPRESSION MICRODISCECTOMY N/A 07/29/2015   Procedure: DECOMPRESSION L4-L5 and L3-L4,  MICRODISCECTOMY L4-L5     (2 LEVELS);  Surgeon: Jene EveryJeffrey Beane, MD;  Location: [facility];  Service: Orthopedics;  Laterality: N/A;  . TONSILLECTOMY      Family History  Problem Relation Age of Onset  . Stroke Mother   . Heart attack Father   . Hypertension Other   . Heart disease Sister   . Deep vein thrombosis Neg Hx     Social History   Socioeconomic History  . Marital status: Married    Spouse name: Not on file  . Number of children: 2  . Years of education: 11th grade  . Highest education level: Not on file  Occupational History  . Occupation: Retired Music therapistwarehouse/delivery work  Engineer, productionocial Needs  . Financial resource strain: Not on file  . Food insecurity    Worry:  Not on file    Inability: Not on file  . Transportation needs    Medical: Not on file    Non-medical: Not on file  Tobacco Use  . Smoking status: Never Smoker  . Smokeless tobacco: Never Used  Substance and Sexual Activity  . Alcohol use: No  . Drug use: No  . Sexual activity: Never  Lifestyle  . Physical activity    Days per week: Not on file    Minutes per session: Not on file  . Stress: Not on file  Relationships  . Social Musicianconnections    Talks on phone: Not on file    Gets together: Not on file    Attends religious service: Not on file    Active member of club or organization: Not on file    Attends meetings of clubs or organizations: Not on file    Relationship status: Not on file  . Intimate partner violence    Fear of current or ex partner: Not on file    Emotionally abused: Not on file    Physically abused: Not on file    Forced sexual activity: Not on file  Other Topics Concern  . Not on file  Social History Narrative   Married, ambulates  with a [employer].     Outpatient Medications Prior to Visit  Medication Sig Dispense Refill  . acetaminophen (TYLENOL) 500 MG tablet Take 500 mg by mouth every 6 (six) hours as needed for mild pain.    Marland Kitchen. apixaban (ELIQUIS) 5 MG TABS tablet Take 1 tablet (5 mg total) by mouth 2 (two) times daily. 60 tablet 0  . ascorbic acid (VITAMIN C) 500 MG tablet Take 500 mg by mouth daily.    Marland Kitchen. b complex vitamins tablet Take 1 tablet by mouth daily.    . bisacodyl (DULCOLAX) 5 MG EC tablet Take 5 mg by mouth daily as needed for moderate constipation.    . calcium-vitamin D (OSCAL WITH D) 500-200 MG-UNIT per tablet Take 1 tablet by mouth daily with breakfast.    . Cyanocobalamin (VITAMIN B 12 PO) Take 1 tablet by mouth daily.    Marland Kitchen. FOLIC ACID PO Take 1 tablet by mouth daily.    Marland Kitchen. gabapentin (NEURONTIN) 100 MG capsule Take 1 capsule (100 mg total) by mouth 3 (three) times daily. 90 capsule 3  . Multiple Vitamin (MULTIVITAMIN WITH MINERALS) TABS  tablet Take 1 tablet by mouth daily.    . TRIAMCINOLONE ACETONIDE EX Apply 1 application topically daily as needed (rash).     No facility-administered medications prior to visit.     Allergies  Allergen Reactions  . Tramadol Other (See Comments)    Hiccups and constipation    Review of Systems  Constitutional: Positive for malaise/fatigue and weight loss.  HENT: Positive for hearing loss.   Eyes:       Glasses  Gastrointestinal: Positive for constipation.  Musculoskeletal: Positive for back pain.  Neurological: Positive for focal weakness and weakness.  Endo/Heme/Allergies: Bruises/bleeds easily.  Psychiatric/Behavioral: Positive for memory loss.       Objective:    Physical Exam  Constitutional: He is oriented to person, place, and time.  Thin built  HENT:  Head: Normocephalic.  Eyes: EOM are normal.  Neck: Normal range of motion.  Cardiovascular: Normal rate and regular rhythm.  Pulmonary/Chest: Effort normal and breath sounds normal.  Abdominal: Soft. Bowel sounds are normal.  Musculoskeletal:     Comments: Unable to walk with out assistance unstable gait  Neurological: He is oriented to person, place, and time.  Skin: Skin is warm and dry.  Psychiatric: He has a normal mood and affect.    Temp (!) 97.5 F (36.4 C) (Tympanic)   Wt 135 lb 9.6 oz (61.5 kg)   BMI 21.24 kg/m  Wt Readings from Last 3 Encounters:  06/04/19 135 lb 9.6 oz (61.5 kg)  12/04/18 135 lb 9.6 oz (61.5 kg)  06/05/18 135 lb 12.8 oz (61.6 kg)    Health Maintenance Due  Topic Date Due  . PNA vac Low Risk Adult (1 of 2 - PCV13) 09/09/2003  . INFLUENZA VACCINE  06/01/2019    There are no preventive care reminders to display for this patient.   Lab Results  Component Value Date   TSH 4.040 01/02/2018   Lab Results  Component Value Date   WBC 4.1 12/04/2018   HGB 14.1 12/04/2018   HCT 41.0 12/04/2018   MCV 98 (H) 12/04/2018   PLT 183 12/04/2018   Lab Results  Component Value  Date   NA 143 06/05/2018   K 4.6 06/05/2018   CO2 23 06/05/2018   GLUCOSE 77 06/05/2018   BUN 9 06/05/2018   CREATININE 1.11 06/05/2018   BILITOT 1.2 01/02/2018  ALKPHOS 105 01/02/2018   AST 25 01/02/2018   ALT 14 01/02/2018   PROT 6.7 01/02/2018   ALBUMIN 4.3 01/02/2018   CALCIUM 9.7 06/05/2018   ANIONGAP 12 09/13/2017       Assessment & Plan:   Problem List Items Addressed This Visit    None     Micajah was seen today for follow-up and medication refill.  Diagnoses and all orders for this visit:  Chronic deep vein thrombosis (DVT) of proximal vein of left lower extremity ([place]) We discussed current management recommendations for unprovoked venous thromboemboli. Most experts would recommend long-term anticoagulation with periodic reevaluation and continuation of anticoagulation as long as ongoing bleeding risk was low. Recent anticoagulation guidelines published in the journal Chest in December 2015, now recommend the anticoagulant of choice for long-term anticoagulation as one of the new target specific oral anticoagulants in preference to warfarin due to the decrease bleeding risk with the new drugs. There is one provocative study in the literature which needs to be reproduced. In this study after a period of initial full dose anticoagulation, patients were randomized in 3 groups to receive either observation alone, full dose apixiban and, or 50% dose reduced apixiban (prophylactic dose 2.5 mg twice daily). This confirmed the ongoing risk for patients stop anticoagulation but probably more importantly showed that anticoagulation efficacy could be maintained with a lower dose without significant bleeding complications.  -     apixaban (ELIQUIS) 5 MG TABS tablet; Take 1 tablet (5 mg total) by mouth 2 (two) times daily.  May-Thurner syndrome -     apixaban (ELIQUIS) 5 MG TABS tablet; Take 1 tablet (5 mg total) by mouth 2 (two) times daily.  Chronic right-sided low back pain with  right-sided sciatica -     gabapentin (NEURONTIN) 100 MG capsule; Take 1 capsule (100 mg total) by mouth 3 (three) times daily.  Need for assistance due to unsteady gait -     DME Wheelchair manual  Other orders -     bisacodyl (DULCOLAX) 5 MG EC tablet; Take 1 tablet (5 mg total) by mouth daily as needed for moderate constipation.    No orders of the defined types were placed in this encounter.    Kerin Perna, NP

## 2019-06-04 NOTE — Progress Notes (Signed)
Pt needs Rx for wheelchair. The one he has it not in good condition

## 2019-06-04 NOTE — Patient Instructions (Signed)

## 2019-06-05 MED FILL — ELIQUIS 5 MG TABLET: 5 | 30 days supply | Qty: 60 | Fill #0

## 2019-06-18 ENCOUNTER — Encounter (INDEPENDENT_AMBULATORY_CARE_PROVIDER_SITE_OTHER): Payer: Self-pay | Admitting: Primary Care

## 2019-06-18 NOTE — Progress Notes (Signed)
Patient suffers from spinal stenosis of the lumbar which impairs his ability to perform daily activities like ADL's, and IADL's in the home.  A cane and/or walker will not resolve  issue with performing activities of daily living. A wheelchair will allow patient to safely perform daily activities. Patient can safely propel the wheelchair in the home or has a caregiver who can provide assistance.   Accessories: elevating leg rests (ELRs), wheel locks, extensions and anti-tippers. Duration of need: 99 years  Associated Visit Diagnoses: Spinal stenosis of the lumbar.

## 2019-07-03 MED FILL — GABAPENTIN 100 MG CAP: 100 | 30 days supply | Qty: 90 | Fill #1

## 2019-08-01 MED FILL — GABAPENTIN 100 MG CAP: 100 | 30 days supply | Qty: 90 | Fill #2

## 2019-09-03 ENCOUNTER — Ambulatory Visit (INDEPENDENT_AMBULATORY_CARE_PROVIDER_SITE_OTHER): Payer: Medicare Other | Admitting: Primary Care

## 2019-10-09 ENCOUNTER — Other Ambulatory Visit: Payer: Self-pay

## 2019-10-09 ENCOUNTER — Emergency Department (HOSPITAL_COMMUNITY)
Admission: EM | Admit: 2019-10-09 | Discharge: 2019-10-10 | Disposition: A | Discharge status: Home / Self Care | Payer: Medicare Other | Attending: Emergency Medicine | Admitting: Emergency Medicine

## 2019-10-09 DIAGNOSIS — Z79899 Other long term (current) drug therapy: Secondary | ICD-10-CM | POA: Insufficient documentation

## 2019-10-09 DIAGNOSIS — K5900 Constipation, unspecified: Secondary | ICD-10-CM | POA: Diagnosis present

## 2019-10-09 DIAGNOSIS — Z7901 Long term (current) use of anticoagulants: Secondary | ICD-10-CM | POA: Insufficient documentation

## 2019-10-09 DIAGNOSIS — K5641 Fecal impaction: Secondary | ICD-10-CM | POA: Diagnosis not present

## 2019-10-09 LAB — COMPREHENSIVE METABOLIC PANEL
ALT: 22 U/L (ref 0–44)
AST: 34 U/L (ref 15–41)
Albumin: 3.5 g/dL (ref 3.5–5.0)
Alkaline Phosphatase: 101 U/L (ref 38–126)
Anion gap: 7 (ref 5–15)
BUN: 18 mg/dL (ref 8–23)
CO2: 32 mmol/L (ref 22–32)
Calcium: 9.7 mg/dL (ref 8.9–10.3)
Chloride: 103 mmol/L (ref 98–111)
Creatinine, Ser: 0.97 mg/dL (ref 0.61–1.24)
GFR calc Af Amer: >60 mL/min (ref >60)
GFR calc non Af Amer: >60 mL/min (ref >60)
Glucose, Bld: 97 mg/dL (ref 70–99)
Potassium: 4.4 mmol/L (ref 3.5–5.1)
Sodium: 142 mmol/L (ref 135–145)
Total Bilirubin: 0.6 mg/dL (ref 0.3–1.2)
Total Protein: 6 g/dL — ABNORMAL LOW (ref 6.5–8.1)

## 2019-10-09 LAB — CBC
HCT: 37.1 % — ABNORMAL LOW (ref 39.0–52.0)
Hemoglobin: 12.4 g/dL — ABNORMAL LOW (ref 13.0–17.0)
MCH: 33.7 pg (ref 26.0–34.0)
MCHC: 33.4 g/dL (ref 30.0–36.0)
MCV: 100.8 fL — ABNORMAL HIGH (ref 80.0–100.0)
Platelets: 212 10*3/uL (ref 150–400)
RBC: 3.68 MIL/uL — ABNORMAL LOW (ref 4.22–5.81)
RDW: 15 % (ref 11.5–15.5)
WBC: 5.9 10*3/uL (ref 4.0–10.5)
nRBC: 0 % (ref 0.0–0.2)

## 2019-10-09 LAB — LIPASE, BLOOD: Lipase: 42 U/L (ref 11–51)

## 2019-10-09 MED ORDER — SODIUM CHLORIDE 0.9% FLUSH
3.0000 mL | Freq: Once | INTRAVENOUS | Status: AC
  Administered 2019-10-09: 3 mL via INTRAVENOUS

## 2019-10-09 MED ORDER — POLYETHYLENE GLYCOL 3350 17 GM/SCOOP PO POWD: 17.0000 g | Freq: Two times a day (BID) | ORAL | 0 refills | Status: AC

## 2019-10-09 MED ORDER — LIDOCAINE HCL URETHRAL/MUCOSAL 2 % EX GEL
1.0000 "application " | Freq: Once | CUTANEOUS | Status: AC
  Administered 2019-10-09: 1 via TOPICAL
  Filled 2019-10-09: qty 20

## 2019-10-09 MED ORDER — SORBITOL 70 % SOLN: 960.0000 mL | TOPICAL_OIL | Freq: Once | ORAL | Status: DC

## 2019-10-09 NOTE — ED Notes (Signed)
Lidocaine at bedside.

## 2019-10-09 NOTE — ED Provider Notes (Signed)
[facility] EMERGENCY DEPARTMENT Provider Note   CSN: [id number] Arrival date & time: 10/09/19  1502     History   Chief Complaint Chief Complaint  Patient presents with  . Abdominal Pain    HPI Derek Bentley is a 83 y.o. male.  With a past medical history of arthritis, BPH, DVT, ileus,, spinal stenosis.  Patient is unable to ambulate secondary to spinal stenosis.  Patient states that he developed constipation has been unable to make a bowel movement for the past 6 or 7 days.  He denies severe abdominal pain but states he has some rectal urgency.  He has noticed a bit of liquid stool leaking from his bottom but has been unable to fully move his bowels.  He complains of rectal urgency and a sensation of large stool volume in the rectal vault.  Patient denies vomiting, abdominal distention, urinary retention.     HPI  Past Medical History:  Diagnosis Date  . Arthritis   . BPH (benign prostatic hyperplasia)   . Diverticulitis   . DVT (deep venous thrombosis) (HCC) 05/12/2017   LLE  . Eczema   . Ileus (HCC)   . Obstruction (acquired) of bladder neck or vesicourethral orifice   . PAF (paroxysmal atrial fibrillation) (HCC)   . SBO (small bowel obstruction) (HCC)   . Spinal stenosis     Patient Active Problem List   Diagnosis Date Noted  . 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 Protein-calorie malnutrition, severe 05/14/2017  . DVT, lower extremity, proximal, acute, left (HCC) 05/12/2017  . DVT (deep venous thrombosis) (HCC) 05/12/2017  . Spinal stenosis of lumbar region 07/29/2015  . Chronic constipation 10/01/2008    Past Surgical History:  Procedure Laterality Date  . BACK SURGERY    . CATARACT EXTRACTION W/ INTRAOCULAR LENS  IMPLANT, BILATERAL Bilateral   . CIRCUMCISION    . FRACTURE SURGERY    . HIP FRACTURE SURGERY Left   . INGUINAL HERNIA REPAIR Bilateral   . LUMBAR LAMINECTOMY/DECOMPRESSION MICRODISCECTOMY N/A 07/29/2015   Procedure:  DECOMPRESSION L4-L5 and L3-L4,  MICRODISCECTOMY L4-L5     (2 LEVELS);  Surgeon: Jene Every, MD;  Location: [facility];  Service: Orthopedics;  Laterality: N/A;  . TONSILLECTOMY          Home Medications    Prior to Admission medications   Medication Sig Start Date End Date Taking? Authorizing Provider  acetaminophen (TYLENOL) 500 MG tablet Take 500 mg by mouth every 6 (six) hours as needed for mild pain.    [provider]  apixaban (ELIQUIS) 5 MG TABS tablet Take 1 tablet (5 mg total) by mouth 2 (two) times daily. 06/04/19   Grayce Sessions, NP  ascorbic acid (VITAMIN C) 500 MG tablet Take 500 mg by mouth daily.    [provider]  b complex vitamins tablet Take 1 tablet by mouth daily.    [provider]  bisacodyl (DULCOLAX) 5 MG EC tablet Take 1 tablet (5 mg total) by mouth daily as needed for moderate constipation. 06/04/19   Grayce Sessions, NP  Cyanocobalamin (VITAMIN B 12 PO) Take 1 tablet by mouth daily.    [provider]  FOLIC ACID PO Take 1 tablet by mouth daily.    [provider]  gabapentin (NEURONTIN) 100 MG capsule Take 1 capsule (100 mg total) by mouth 3 (three) times daily. 06/04/19   Grayce Sessions, NP  Multiple Vitamin (MULTIVITAMIN WITH MINERALS) TABS tablet Take 1 tablet by  mouth daily.    [provider]  TRIAMCINOLONE ACETONIDE EX Apply 1 application topically daily as needed (rash).    [provider]    Family History Family History  Problem Relation Age of Onset  . Stroke Mother   . Heart attack Father   . Hypertension Other   . Heart disease Sister   . Deep vein thrombosis Neg Hx     Social History Social History   Tobacco Use  . Smoking status: Never Smoker  . Smokeless tobacco: Never Used  Substance Use Topics  . Alcohol use: No  . Drug use: No     Allergies   Tramadol   Review of Systems Review of Systems Ten systems reviewed and are negative for acute change,  except as noted in the HPI.    Physical Exam Updated Vital Signs BP 119/83   Pulse (!) 114   Temp 98.5 F (36.9 C) (Oral)   Resp 15   SpO2 97%   Physical Exam Vitals signs and nursing note reviewed. Exam conducted with a chaperone present.  Constitutional:      General: He is not in acute distress.    Appearance: He is well-developed. He is not diaphoretic.  HENT:     Head: Normocephalic and atraumatic.  Eyes:     General: No scleral icterus.    Conjunctiva/sclera: Conjunctivae normal.  Neck:     Musculoskeletal: Normal range of motion and neck supple.  Cardiovascular:     Rate and Rhythm: Normal rate and regular rhythm.     Heart sounds: Normal heart sounds.  Pulmonary:     Effort: Pulmonary effort is normal. No respiratory distress.     Breath sounds: Normal breath sounds.  Abdominal:     Palpations: Abdomen is soft.     Tenderness: There is no abdominal tenderness.  Genitourinary:    Comments: Digital Rectal Exam reveals sphincter with good tone. No external hemorrhoids. No masses or fissures.  Large, firm stool bal palpable within the rectal vault. Skin:    General: Skin is warm and dry.  Neurological:     Mental Status: He is alert.  Psychiatric:        Behavior: Behavior normal.      ED Treatments / Results  Labs (all labs ordered are listed, but only abnormal results are displayed) Labs Reviewed  COMPREHENSIVE METABOLIC PANEL - Abnormal; Notable for the following components:      Result Value   Total Protein 6.0 (*)    All other components within normal limits  CBC - Abnormal; Notable for the following components:   RBC 3.68 (*)    Hemoglobin 12.4 (*)    HCT 37.1 (*)    MCV 100.8 (*)    All other components within normal limits  LIPASE, BLOOD  URINALYSIS, ROUTINE W REFLEX MICROSCOPIC    EKG EKG Interpretation  Date/Time:  Wednesday October 09 2019 15:30:18 EST Ventricular Rate:  109 PR Interval:  164 QRS Duration: 130 QT Interval:  360  QTC Calculation: 484 R Axis:   -73 Text Interpretation: Sinus tachycardia Right bundle branch block Left anterior fascicular block  Bifascicular block   - block unchanged from prior ECG Minimal voltage criteria for LVH, may be normal variant ( R in aVL ) Abnormal ECG No STEMI Confirmed by Octaviano Glow [phone]) on 10/09/2019 7:03:19 PM   Radiology No results found.  Procedures Fecal disimpaction  Date/Time: 10/09/2019 11:46 PM Performed by: Margarita Mail, PA-C Authorized by: Margarita Mail,  PA-C  Consent: Verbal consent obtained. Risks and benefits: risks, benefits and alternatives were discussed Consent given by: patient Patient understanding: patient states understanding of the procedure being performed Patient identity confirmed: verbally with patient and provided demographic data Time out: Immediately prior to procedure a "time out" was called to verify the correct patient, procedure, equipment, support staff and site/side marked as required. Local anesthesia used: yes  Anesthesia: Local anesthesia used: yes Local Anesthetic: topical anesthetic Patient tolerance: patient tolerated the procedure well with no immediate complications    (including critical care time)  Medications Ordered in ED Medications  lidocaine (XYLOCAINE) 2 % jelly 1 application (has no administration in time range)  sodium chloride flush (NS) 0.9 % injection 3 mL (3 mLs Intravenous Given 10/09/19 1958)     Initial Impression / Assessment and Plan / ED Course  I have reviewed the triage vital signs and the nursing notes.  Pertinent labs & imaging results that were available during my care of the patient were reviewed by me and considered in my medical decision making (see chart for details).        Patient with fecal impaction.  He is receiving soapsuds enema.  Will discharge with bowel regimen.  I have given signout to Dr. Judd Lienelo  Final Clinical Impressions(s) / ED Diagnoses   Final  diagnoses:  None    ED Discharge Orders    None       Arthor CaptainHarris, Jeslyn Amsler, PA-C 10/10/19 0017    Virgina Norfolkuratolo, Adam, DO 10/10/19 [phone]

## 2019-10-09 NOTE — ED Notes (Signed)
Soap suds enema ordered from materials, will be tubed to tube station 74 in ED

## 2019-10-09 NOTE — Discharge Instructions (Addendum)
Contact a health care provider if: You have ongoing pain in your rectum. You need to use an enema or a suppository more than 2 times a week. You have rectal bleeding. You continue to have problems. The problems may include not being able to go to the bathroom and long-term (chronic) constipation. You have pain in your abdomen. You have thin, pencil-like stools. Get help right away if: You have black or tarry stools.

## 2019-10-09 NOTE — ED Triage Notes (Addendum)
C/o generalized abd pain and constipation x 1 week.  Also reports mild dizziness.  Taking milk of magnesia and senokot x 4-5 days without results.

## 2019-10-10 MED FILL — POLYETHYLENE GLYCOL 3350 PO: 17 | 7 days supply | Qty: 238 | Fill #0

## 2019-10-10 NOTE — ED Provider Notes (Signed)
Soapsuds enema administered with some results.  Patient feeling better.  At this point, I feel as though discharge is appropriate.  To return as needed for any problems.   Veryl Speak, MD 10/10/19 [phone]

## 2019-10-10 NOTE — ED Notes (Signed)
Dr. Stark Jock advised this RN patient is still appropriate for discharge at this time.

## 2019-10-10 NOTE — ED Notes (Signed)
Patient verbalizes understanding of discharge instructions. Opportunity for questioning and answers were provided. 

## 2019-10-10 NOTE — ED Notes (Signed)
Patient's son at bedside, this RN went over d/c papers with patient's son who verbalized understanding.

## 2019-10-10 NOTE — ED Notes (Signed)
Soap Suds Enema Administered

## 2019-10-10 NOTE — ED Notes (Signed)
Patient's heart rate 128 at this time, Dr. Stark Jock made aware and in to assess patient appropriateness r/t dispo of discharge.

## 2019-10-10 NOTE — ED Notes (Signed)
Patient unable to contact family for a ride home upon d/c. Only number in chart is home phone number (pt nor wife have cell phone) and patient states wife is in [place] with his son whom he does not have a number for.   Spoke with Charge RN woody, will place patient in hallway bed to wait for his ride as this RN does not feel pt is appropriate to wait in waiting room all night. Pt cleaned up of stool from bowel movement, brief and clothes put on patient. Warm blankets provided. Pt verbalizes understanding of plan to wait in hall bed until family arrives in the am.

## 2019-10-11 ENCOUNTER — Ambulatory Visit: Payer: Self-pay | Admitting: Internal Medicine

## 2019-10-18 ENCOUNTER — Other Ambulatory Visit: Payer: Self-pay

## 2019-10-18 ENCOUNTER — Ambulatory Visit (INDEPENDENT_AMBULATORY_CARE_PROVIDER_SITE_OTHER): Payer: Medicare Other | Admitting: Primary Care

## 2019-10-18 ENCOUNTER — Encounter (INDEPENDENT_AMBULATORY_CARE_PROVIDER_SITE_OTHER): Payer: Self-pay | Admitting: Primary Care

## 2019-10-18 DIAGNOSIS — Z09 Encounter for follow-up examination after completed treatment for conditions other than malignant neoplasm: Secondary | ICD-10-CM

## 2019-10-18 DIAGNOSIS — K5909 Other constipation: Secondary | ICD-10-CM | POA: Diagnosis not present

## 2019-10-18 DIAGNOSIS — M48061 Spinal stenosis, lumbar region without neurogenic claudication: Secondary | ICD-10-CM | POA: Diagnosis not present

## 2019-10-18 NOTE — Progress Notes (Signed)
Pt provides permission to speak with son [place]  Pt would like to know what to do when fecal impaction occurs and has been without bowel movement for 3 or more days Needs Rx for constipation- does have liquid dulcolax  Pt would like clarification on how often he should use miralax and dulcolax Urgency to urinate- but nothing comes Can not sleep on back  Pain in left hip and right shoulder

## 2019-10-18 NOTE — Progress Notes (Signed)
Virtual Visit via Telephone Note  I connected with Derek Bentley on 10/18/19 at 11:10 AM EST by telephone and verified that I am speaking with the correct person using two identifiers.   I discussed the limitations, risks, security and privacy concerns of performing an evaluation and management service by telephone and the availability of in person appointments. I also discussed with the patient that there may be a patient responsible charge related to this service. The patient expressed understanding and agreed to proceed.   History of Present Illness: Derek Bentley is having a tele visit for emergency room follow up for fecal impaction.P atient has spinal stenosis decrease mobility can also play a role in constipation.  Derek Bentley presented to the emergency room after not having a bowel movement for 6 to 7 days .   He has noticed a bit of liquid stool leaking from his bottom but has been unable to fully move his bowels.  He complains of rectal urgency and a sensation of large stool volume in the rectal vault.    Past Medical History:  Diagnosis Date  . Arthritis   . BPH (benign prostatic hyperplasia)   . Diverticulitis   . DVT (deep venous thrombosis) (HCC) 05/12/2017   LLE  . Eczema   . Ileus (HCC)   . Obstruction (acquired) of bladder neck or vesicourethral orifice   . PAF (paroxysmal atrial fibrillation) (HCC)   . SBO (small bowel obstruction) (HCC)   . Spinal stenosis    Current Outpatient Medications on File Prior to Visit  Medication Sig Dispense Refill  . polyethylene glycol powder (GLYCOLAX/MIRALAX) 17 GM/SCOOP powder Take 17 g by mouth 2 (two) times daily. Until daily soft stools OTC 255 g 0  . acetaminophen (TYLENOL) 500 MG tablet Take 500 mg by mouth every 6 (six) hours as needed for mild pain.    Marland Kitchen apixaban (ELIQUIS) 5 MG TABS tablet Take 1 tablet (5 mg total) by mouth 2 (two) times daily. 60 tablet 3  . ascorbic acid (VITAMIN C) 500 MG tablet Take 500 mg by mouth  daily.    Marland Kitchen b complex vitamins tablet Take 1 tablet by mouth daily.    . bisacodyl (DULCOLAX) 5 MG EC tablet Take 1 tablet (5 mg total) by mouth daily as needed for moderate constipation. (Patient not taking: Reported on 10/18/2019) 30 tablet 3  . Cyanocobalamin (VITAMIN B 12 PO) Take 1 tablet by mouth daily.    Marland Kitchen FOLIC ACID PO Take 1 tablet by mouth daily.    Marland Kitchen gabapentin (NEURONTIN) 100 MG capsule Take 1 capsule (100 mg total) by mouth 3 (three) times daily. 90 capsule 3  . Melaton-Thean-Cham-PassF-LBalm (SLEEP PO) Take 2 tablets by mouth at bedtime.    . Multiple Vitamin (MULTIVITAMIN WITH MINERALS) TABS tablet Take 1 tablet by mouth daily.    . TRIAMCINOLONE ACETONIDE EX Apply 1 application topically daily as needed (rash).     No current facility-administered medications on file prior to visit.   Observations/Objective: Review of Systems  Constitutional: Positive for malaise/fatigue.  Gastrointestinal: Positive for constipation.  Musculoskeletal:       Due to spinal stenosis unable to ambulate increased risk for falls  All other systems reviewed and are negative.   Assessment and Plan: Derek Bentley was seen today for follow-up.  Diagnoses and all orders for this visit:  Hospital discharge follow-up\ Derek Bentley presented to the emergency room after 6 to 7 days with the inability to have a bowel  movement he is having a telemetry visit for follow-up encouraged to increase water intake fruits and vegetables and refer to instructions given at discharge for bowel regimen  Spinal stenosis of lumbar region, unspecified whether neurogenic claudication present Patient is unable to ambulate which is also a participate in factor in his constipation.  Medication reviewed and none are contributing factors to his constipation.  Chronic constipation Patient received a soapsuds enema. Discharge with bowel regimen.  Increased risk for bowel obstruction a regiment of over-the-counter laxatives daily may  help with defecation.   Follow Up Instructions:    I discussed the assessment and treatment plan with the patient. The patient was provided an opportunity to ask questions and all were answered. The patient agreed with the plan and demonstrated an understanding of the instructions.   The patient was advised to call back or seek an in-person evaluation if the symptoms worsen or if the condition fails to improve as anticipated.  I provided 14 minutes of non-face-to-face time during this encounter.  This includes reviewing previous encounters   Kerin Perna, NP

## 2019-10-21 ENCOUNTER — Ambulatory Visit: Payer: Self-pay | Admitting: Nurse Practitioner

## 2019-11-07 ENCOUNTER — Other Ambulatory Visit (INDEPENDENT_AMBULATORY_CARE_PROVIDER_SITE_OTHER): Payer: Self-pay | Admitting: Primary Care

## 2019-11-07 ENCOUNTER — Telehealth (INDEPENDENT_AMBULATORY_CARE_PROVIDER_SITE_OTHER): Payer: Self-pay

## 2019-11-07 DIAGNOSIS — N401 Enlarged prostate with lower urinary tract symptoms: Secondary | ICD-10-CM

## 2019-11-07 NOTE — Telephone Encounter (Signed)
FWD to PCP

## 2019-11-07 NOTE — Telephone Encounter (Signed)
Patient son called to see the status of the Urology referral. Patient was advice that no referral has been placed. Patient son states that on Dec 19 they had a over the phone visit with PCP and she advice she would send in medication for BPH (benign prostatic hyperplasia) or send a referral.    Please advice [phone]  Thank you Lula Olszewski

## 2019-11-11 ENCOUNTER — Other Ambulatory Visit (INDEPENDENT_AMBULATORY_CARE_PROVIDER_SITE_OTHER): Payer: Self-pay | Admitting: Primary Care

## 2019-11-11 DIAGNOSIS — R972 Elevated prostate specific antigen [PSA]: Secondary | ICD-10-CM

## 2019-11-11 DIAGNOSIS — N400 Enlarged prostate without lower urinary tract symptoms: Secondary | ICD-10-CM

## 2019-11-11 MED ORDER — TAMSULOSIN HCL 0.4 MG PO CAPS: 0.4000 mg | ORAL_CAPSULE | Freq: Every day | ORAL | 3 refills | Status: AC

## 2019-11-12 MED FILL — TAMSULOSIN HCL 0.4 MG CAP: 0.4 | 30 days supply | Qty: 30 | Fill #0

## 2019-11-25 ENCOUNTER — Ambulatory Visit: Payer: Self-pay | Admitting: Internal Medicine

## 2019-12-09 MED FILL — TAMSULOSIN HCL 0.4 MG CAP: 0.4 | 30 days supply | Qty: 30 | Fill #1

## 2020-01-02 MED FILL — TAMSULOSIN HCL 0.4 MG CAP: 0.4 | 30 days supply | Qty: 30 | Fill #2

## 2020-01-09 ENCOUNTER — Telehealth (INDEPENDENT_AMBULATORY_CARE_PROVIDER_SITE_OTHER): Payer: Self-pay

## 2020-01-09 NOTE — Telephone Encounter (Signed)
Sent to PCP ?

## 2020-01-09 NOTE — Telephone Encounter (Signed)
Patient son called requesting a referral to [facility]  [facility]. Patient son states that he tried to make his father an appointment and was advice PCP needed to send a referral in order to schedule his father an appointment.   [facility] @ [facility]  Phone: [phone]  Fax: [phone]

## 2020-01-10 ENCOUNTER — Other Ambulatory Visit (INDEPENDENT_AMBULATORY_CARE_PROVIDER_SITE_OTHER): Payer: Self-pay | Admitting: Primary Care

## 2020-01-10 DIAGNOSIS — R5381 Other malaise: Secondary | ICD-10-CM

## 2020-02-10 MED FILL — TAMSULOSIN HCL 0.4 MG CAP: 0.4 | 30 days supply | Qty: 30 | Fill #3

## 2020-12-01 DEATH — deceased
